# Patient Record
Sex: Male | Born: 1960
Health system: Southern US, Community
[De-identification: ages and names within clinical notes are randomized; demographics above are authoritative.]

## PROBLEM LIST (undated history)

## (undated) ENCOUNTER — Ambulatory Visit (HOSPITAL_COMMUNITY): Discharge status: Absent without leave | Payer: Medicaid Other

## (undated) DIAGNOSIS — R0602 Shortness of breath: Secondary | ICD-10-CM

## (undated) DIAGNOSIS — T7840XA Allergy, unspecified, initial encounter: Secondary | ICD-10-CM

## (undated) DIAGNOSIS — J189 Pneumonia, unspecified organism: Secondary | ICD-10-CM

## (undated) DIAGNOSIS — D72819 Decreased white blood cell count, unspecified: Secondary | ICD-10-CM

## (undated) DIAGNOSIS — K219 Gastro-esophageal reflux disease without esophagitis: Secondary | ICD-10-CM

## (undated) DIAGNOSIS — I10 Essential (primary) hypertension: Secondary | ICD-10-CM

## (undated) HISTORY — PX: OTHER SURGICAL HISTORY: SHX169

## (undated) HISTORY — DX: Essential (primary) hypertension: I10

## (undated) HISTORY — DX: Allergy, unspecified, initial encounter: T78.40XA

---
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
## 2002-01-06 ENCOUNTER — Emergency Department (HOSPITAL_COMMUNITY): Admission: EM | Admit: 2002-01-06 | Discharge: 2002-01-06 | Payer: Self-pay | Admitting: Emergency Medicine

## 2007-01-01 ENCOUNTER — Emergency Department (HOSPITAL_COMMUNITY): Admission: EM | Admit: 2007-01-01 | Discharge: 2007-01-01 | Payer: Self-pay | Admitting: Emergency Medicine

## 2007-09-24 ENCOUNTER — Emergency Department (HOSPITAL_COMMUNITY): Admission: EM | Admit: 2007-09-24 | Discharge: 2007-09-25 | Payer: Self-pay | Admitting: Emergency Medicine

## 2008-10-06 ENCOUNTER — Emergency Department (HOSPITAL_COMMUNITY): Admission: EM | Admit: 2008-10-06 | Discharge: 2008-10-06 | Payer: Self-pay | Admitting: Emergency Medicine

## 2010-02-02 ENCOUNTER — Ambulatory Visit: Payer: Self-pay | Admitting: Internal Medicine

## 2010-03-09 ENCOUNTER — Ambulatory Visit: Payer: Self-pay | Admitting: Internal Medicine

## 2010-03-09 ENCOUNTER — Encounter (INDEPENDENT_AMBULATORY_CARE_PROVIDER_SITE_OTHER): Payer: Self-pay | Admitting: Family Medicine

## 2010-03-09 LAB — CONVERTED CEMR LAB
ALT: 14 units/L (ref 0–53)
AST: 25 units/L (ref 0–37)
Albumin: 4.3 g/dL (ref 3.5–5.2)
Alkaline Phosphatase: 68 units/L (ref 39–117)
BUN: 16 mg/dL (ref 6–23)
Basophils Absolute: 0 10*3/uL (ref 0.0–0.1)
Basophils Relative: 0 % (ref 0–1)
CO2: 26 meq/L (ref 19–32)
Calcium: 9.3 mg/dL (ref 8.4–10.5)
Chloride: 102 meq/L (ref 96–112)
Cholesterol: 199 mg/dL (ref 0–200)
Creatinine, Ser: 0.98 mg/dL (ref 0.40–1.50)
Eosinophils Absolute: 0.3 10*3/uL (ref 0.0–0.7)
Eosinophils Relative: 8 % — ABNORMAL HIGH (ref 0–5)
Glucose, Bld: 95 mg/dL (ref 70–99)
HCT: 43.2 % (ref 39.0–52.0)
HDL: 48 mg/dL (ref >39)
Hemoglobin: 14.6 g/dL (ref 13.0–17.0)
LDL Cholesterol: 136 mg/dL — ABNORMAL HIGH (ref 0–99)
Lymphocytes Relative: 51 % — ABNORMAL HIGH (ref 12–46)
Lymphs Abs: 1.7 10*3/uL (ref 0.7–4.0)
MCHC: 33.8 g/dL (ref 30.0–36.0)
MCV: 81.2 fL (ref 78.0–100.0)
Monocytes Absolute: 0.3 10*3/uL (ref 0.1–1.0)
Monocytes Relative: 9 % (ref 3–12)
Neutro Abs: 1.1 10*3/uL — ABNORMAL LOW (ref 1.7–7.7)
Neutrophils Relative %: 32 % — ABNORMAL LOW (ref 43–77)
Platelets: 249 10*3/uL (ref 150–400)
Potassium: 4.5 meq/L (ref 3.5–5.3)
RBC: 5.32 M/uL (ref 4.22–5.81)
RDW: 14.2 % (ref 11.5–15.5)
Sodium: 137 meq/L (ref 135–145)
Total Bilirubin: 0.5 mg/dL (ref 0.3–1.2)
Total CHOL/HDL Ratio: 4.1
Total Protein: 7.7 g/dL (ref 6.0–8.3)
Triglycerides: 76 mg/dL (ref <150)
VLDL: 15 mg/dL (ref 0–40)
WBC: 3.4 10*3/uL — ABNORMAL LOW (ref 4.0–10.5)

## 2010-03-14 ENCOUNTER — Encounter (INDEPENDENT_AMBULATORY_CARE_PROVIDER_SITE_OTHER): Payer: Self-pay | Admitting: Family Medicine

## 2010-03-23 ENCOUNTER — Ambulatory Visit: Payer: Self-pay | Admitting: Internal Medicine

## 2010-05-24 ENCOUNTER — Encounter (INDEPENDENT_AMBULATORY_CARE_PROVIDER_SITE_OTHER): Payer: Self-pay | Admitting: Family Medicine

## 2010-05-24 ENCOUNTER — Ambulatory Visit: Payer: Self-pay | Admitting: Internal Medicine

## 2010-05-24 LAB — CONVERTED CEMR LAB
Basophils Relative: 0 % (ref 0–1)
Eosinophils Absolute: 0.2 10*3/uL (ref 0.0–0.7)
MCHC: 32.9 g/dL (ref 30.0–36.0)
MCV: 81.8 fL (ref 78.0–100.0)
Monocytes Relative: 11 % (ref 3–12)
Neutrophils Relative %: 28 % — ABNORMAL LOW (ref 43–77)
Platelets: 223 10*3/uL (ref 150–400)
RDW: 13.9 % (ref 11.5–15.5)

## 2010-08-23 ENCOUNTER — Encounter (INDEPENDENT_AMBULATORY_CARE_PROVIDER_SITE_OTHER): Payer: Self-pay | Admitting: Internal Medicine

## 2010-08-23 LAB — CONVERTED CEMR LAB
Basophils Relative: 1 % (ref 0–1)
Chlamydia, Swab/Urine, PCR: POSITIVE — AB
Eosinophils Absolute: 0.2 10*3/uL (ref 0.0–0.7)
GC Probe Amp, Urine: NEGATIVE
Lymphs Abs: 1.7 10*3/uL (ref 0.7–4.0)
MCV: 81.1 fL (ref 78.0–100.0)
Neutrophils Relative %: 37 % — ABNORMAL LOW (ref 43–77)
Platelets: 239 10*3/uL (ref 150–400)
WBC: 3.5 10*3/uL — ABNORMAL LOW (ref 4.0–10.5)

## 2010-08-29 ENCOUNTER — Ambulatory Visit: Payer: Self-pay | Admitting: Internal Medicine

## 2010-09-10 ENCOUNTER — Emergency Department (HOSPITAL_COMMUNITY)
Admission: EM | Admit: 2010-09-10 | Discharge: 2010-09-10 | Payer: Self-pay | Source: Home / Self Care | Admitting: Emergency Medicine

## 2010-10-02 ENCOUNTER — Ambulatory Visit: Payer: Self-pay | Admitting: Internal Medicine

## 2010-10-04 LAB — CBC WITH DIFFERENTIAL/PLATELET
Basophils Absolute: 0 10*3/uL (ref 0.0–0.1)
EOS%: 4.2 % (ref 0.0–7.0)
HCT: 45.9 % (ref 38.4–49.9)
HGB: 15.6 g/dL (ref 13.0–17.1)
MCH: 27.3 pg (ref 27.2–33.4)
MCV: 80.2 fL (ref 79.3–98.0)
MONO%: 7.5 % (ref 0.0–14.0)
NEUT%: 32.3 % — ABNORMAL LOW (ref 39.0–75.0)
Platelets: 251 10*3/uL (ref 140–400)

## 2010-10-05 LAB — COMPREHENSIVE METABOLIC PANEL
CO2: 29 mEq/L (ref 19–32)
Calcium: 9.8 mg/dL (ref 8.4–10.5)
Creatinine, Ser: 1.06 mg/dL (ref 0.40–1.50)
Glucose, Bld: 106 mg/dL — ABNORMAL HIGH (ref 70–99)
Total Bilirubin: 0.4 mg/dL (ref 0.3–1.2)
Total Protein: 7.3 g/dL (ref 6.0–8.3)

## 2010-10-05 LAB — VITAMIN B12: Vitamin B-12: 261 pg/mL (ref 211–911)

## 2010-10-05 LAB — FOLATE RBC: RBC Folate: 599 ng/mL (ref 180–600)

## 2010-10-05 LAB — IRON AND TIBC
TIBC: 310 ug/dL (ref 215–435)
UIBC: 212 ug/dL

## 2010-10-05 LAB — FERRITIN: Ferritin: 171 ng/mL (ref 22–322)

## 2010-10-06 LAB — HEPATITIS B CORE ANTIBODY, IGM: Hep B C IgM: NEGATIVE

## 2010-10-06 LAB — HIV ANTIBODY (ROUTINE TESTING W REFLEX): HIV: NONREACTIVE

## 2010-11-02 ENCOUNTER — Ambulatory Visit: Payer: Self-pay | Admitting: Internal Medicine

## 2010-11-06 LAB — COMPREHENSIVE METABOLIC PANEL
ALT: 16 U/L (ref 0–53)
AST: 25 U/L (ref 0–37)
Albumin: 4.4 g/dL (ref 3.5–5.2)
Alkaline Phosphatase: 69 U/L (ref 39–117)
BUN: 8 mg/dL (ref 6–23)
CO2: 27 mEq/L (ref 19–32)
Calcium: 9.7 mg/dL (ref 8.4–10.5)
Chloride: 101 mEq/L (ref 96–112)
Creatinine, Ser: 1.08 mg/dL (ref 0.40–1.50)
Glucose, Bld: 152 mg/dL — ABNORMAL HIGH (ref 70–99)
Potassium: 3.6 mEq/L (ref 3.5–5.3)
Sodium: 136 mEq/L (ref 135–145)
Total Bilirubin: 0.6 mg/dL (ref 0.3–1.2)
Total Protein: 7.8 g/dL (ref 6.0–8.3)

## 2010-11-06 LAB — CBC WITH DIFFERENTIAL/PLATELET
BASO%: 0.4 % (ref 0.0–2.0)
Basophils Absolute: 0 10*3/uL (ref 0.0–0.1)
EOS%: 6.5 % (ref 0.0–7.0)
Eosinophils Absolute: 0.2 10*3/uL (ref 0.0–0.5)
HCT: 44.6 % (ref 38.4–49.9)
HGB: 15 g/dL (ref 13.0–17.1)
LYMPH%: 45.2 % (ref 14.0–49.0)
MCH: 27.6 pg (ref 27.2–33.4)
MCHC: 33.8 g/dL (ref 32.0–36.0)
MCV: 81.8 fL (ref 79.3–98.0)
MONO#: 0.3 10*3/uL (ref 0.1–0.9)
MONO%: 7.7 % (ref 0.0–14.0)
NEUT#: 1.4 10*3/uL — ABNORMAL LOW (ref 1.5–6.5)
NEUT%: 40.2 % (ref 39.0–75.0)
Platelets: 221 10*3/uL (ref 140–400)
RBC: 5.45 10*6/uL (ref 4.20–5.82)
RDW: 13.8 % (ref 11.0–14.6)
WBC: 3.4 10*3/uL — ABNORMAL LOW (ref 4.0–10.3)
lymph#: 1.5 10*3/uL (ref 0.9–3.3)

## 2010-11-06 LAB — LACTATE DEHYDROGENASE: LDH: 126 U/L (ref 94–250)

## 2010-11-09 ENCOUNTER — Encounter (INDEPENDENT_AMBULATORY_CARE_PROVIDER_SITE_OTHER): Payer: Self-pay | Admitting: Internal Medicine

## 2010-11-09 LAB — CONVERTED CEMR LAB
ALT: 14 units/L (ref 0–53)
AST: 23 units/L (ref 0–37)
Chloride: 107 meq/L (ref 96–112)
Creatinine, Ser: 1.06 mg/dL (ref 0.40–1.50)
Total Bilirubin: 0.3 mg/dL (ref 0.3–1.2)

## 2010-12-11 ENCOUNTER — Encounter (INDEPENDENT_AMBULATORY_CARE_PROVIDER_SITE_OTHER): Payer: Self-pay | Admitting: *Deleted

## 2011-01-02 LAB — OVA AND PARASITE EXAMINATION

## 2011-01-02 LAB — STOOL CULTURE

## 2011-01-02 LAB — CLOSTRIDIUM DIFFICILE EIA

## 2011-02-05 ENCOUNTER — Other Ambulatory Visit: Payer: Self-pay | Admitting: Internal Medicine

## 2011-02-05 ENCOUNTER — Encounter (HOSPITAL_BASED_OUTPATIENT_CLINIC_OR_DEPARTMENT_OTHER): Payer: Self-pay | Admitting: Internal Medicine

## 2011-02-05 DIAGNOSIS — D708 Other neutropenia: Secondary | ICD-10-CM

## 2011-02-05 DIAGNOSIS — D72819 Decreased white blood cell count, unspecified: Secondary | ICD-10-CM

## 2011-02-05 LAB — CBC WITH DIFFERENTIAL/PLATELET
Basophils Absolute: 0 10*3/uL (ref 0.0–0.1)
EOS%: 10.5 % — ABNORMAL HIGH (ref 0.0–7.0)
HCT: 42.5 % (ref 38.4–49.9)
HGB: 14.4 g/dL (ref 13.0–17.1)
LYMPH%: 45.8 % (ref 14.0–49.0)
MCH: 27.7 pg (ref 27.2–33.4)
MCHC: 34 g/dL (ref 32.0–36.0)
MCV: 81.6 fL (ref 79.3–98.0)
MONO%: 11.2 % (ref 0.0–14.0)
NEUT%: 32.1 % — ABNORMAL LOW (ref 39.0–75.0)
Platelets: 200 10*3/uL (ref 140–400)
lymph#: 1.8 10*3/uL (ref 0.9–3.3)

## 2011-08-07 ENCOUNTER — Other Ambulatory Visit: Payer: Self-pay | Admitting: Internal Medicine

## 2011-08-07 ENCOUNTER — Encounter (HOSPITAL_BASED_OUTPATIENT_CLINIC_OR_DEPARTMENT_OTHER): Payer: Self-pay | Admitting: Internal Medicine

## 2011-08-07 DIAGNOSIS — D708 Other neutropenia: Secondary | ICD-10-CM

## 2011-08-07 DIAGNOSIS — D72819 Decreased white blood cell count, unspecified: Secondary | ICD-10-CM

## 2011-08-07 LAB — CBC WITH DIFFERENTIAL/PLATELET
EOS%: 7.2 % — ABNORMAL HIGH (ref 0.0–7.0)
Eosinophils Absolute: 0.2 10*3/uL (ref 0.0–0.5)
LYMPH%: 52 % — ABNORMAL HIGH (ref 14.0–49.0)
MCH: 27.9 pg (ref 27.2–33.4)
MCV: 82.1 fL (ref 79.3–98.0)
MONO%: 12.1 % (ref 0.0–14.0)
NEUT#: 0.9 10*3/uL — ABNORMAL LOW (ref 1.5–6.5)
Platelets: 198 10*3/uL (ref 140–400)
RBC: 5.22 10*6/uL (ref 4.20–5.82)

## 2011-08-07 LAB — LACTATE DEHYDROGENASE: LDH: 139 U/L (ref 94–250)

## 2011-08-24 ENCOUNTER — Ambulatory Visit (HOSPITAL_COMMUNITY): Payer: Self-pay

## 2011-08-29 ENCOUNTER — Other Ambulatory Visit: Payer: Self-pay | Admitting: Internal Medicine

## 2011-08-29 ENCOUNTER — Telehealth: Payer: Self-pay | Admitting: Internal Medicine

## 2011-08-29 NOTE — Telephone Encounter (Signed)
Left message to call.

## 2011-09-07 ENCOUNTER — Telehealth: Payer: Self-pay | Admitting: Internal Medicine

## 2011-09-07 ENCOUNTER — Telehealth: Payer: Self-pay | Admitting: *Deleted

## 2011-09-07 ENCOUNTER — Other Ambulatory Visit: Payer: Self-pay | Admitting: *Deleted

## 2011-09-07 DIAGNOSIS — D72819 Decreased white blood cell count, unspecified: Secondary | ICD-10-CM

## 2011-09-07 NOTE — Telephone Encounter (Signed)
PT. TOLD THE EMERGENCY ROOM RECEPTIONIST THAT HE HAD AN APPOINTMENT THERE WITH DR.MOHAMED AT NOON. PT. WAS UPSET BECAUSE SOMEONE AT THE CANCER CENTER TOLD PT. HE DID NOT HAVE AN APPOINTMENT WITH DR.MOHAMED TODAY. INSTRUCTED RECEPTIONIST TO HAVE PT. RETURN TO THE LOBBY AT THE CANCER CENTER AND THIS NURSE WOULD TALK WITH HIM. SPOKE WITH DR.MOHAMED, STEPHANIE JOHNSON,RN. SHE WILL ALSO COME AND TALK WITH THE PT. WHEN HE ARRIVES AT THE CANCER CENTER. PT. NEVER CAME TO THE CANCER CENTER.

## 2011-09-07 NOTE — Telephone Encounter (Signed)
Talked to pt and gave appt for 11/30 and appt for 12/3, r/s from 11/28th

## 2011-09-09 NOTE — Telephone Encounter (Signed)
No encounter

## 2011-09-17 ENCOUNTER — Encounter (HOSPITAL_COMMUNITY): Payer: Self-pay | Admitting: Pharmacy Technician

## 2011-09-19 ENCOUNTER — Other Ambulatory Visit: Payer: Self-pay | Admitting: Lab

## 2011-09-19 ENCOUNTER — Ambulatory Visit: Payer: Self-pay | Admitting: Internal Medicine

## 2011-09-19 ENCOUNTER — Other Ambulatory Visit (HOSPITAL_COMMUNITY): Payer: Self-pay | Admitting: *Deleted

## 2011-09-20 ENCOUNTER — Other Ambulatory Visit (HOSPITAL_COMMUNITY): Payer: Self-pay | Admitting: *Deleted

## 2011-09-20 ENCOUNTER — Other Ambulatory Visit: Payer: Self-pay | Admitting: Internal Medicine

## 2011-09-20 DIAGNOSIS — D72819 Decreased white blood cell count, unspecified: Secondary | ICD-10-CM

## 2011-09-20 NOTE — Progress Notes (Signed)
Error encounter. 

## 2011-09-21 ENCOUNTER — Ambulatory Visit (HOSPITAL_COMMUNITY)
Admission: RE | Admit: 2011-09-21 | Discharge: 2011-09-21 | Disposition: A | Discharge status: Home / Self Care | Payer: Self-pay | Source: Ambulatory Visit | Attending: Internal Medicine | Admitting: Internal Medicine

## 2011-09-21 ENCOUNTER — Encounter: Payer: Self-pay | Admitting: Internal Medicine

## 2011-09-21 ENCOUNTER — Other Ambulatory Visit: Payer: Self-pay | Admitting: Internal Medicine

## 2011-09-21 ENCOUNTER — Encounter (HOSPITAL_COMMUNITY): Payer: Self-pay

## 2011-09-21 ENCOUNTER — Telehealth: Payer: Self-pay | Admitting: Internal Medicine

## 2011-09-21 DIAGNOSIS — D72819 Decreased white blood cell count, unspecified: Secondary | ICD-10-CM | POA: Insufficient documentation

## 2011-09-21 HISTORY — DX: Decreased white blood cell count, unspecified: D72.819

## 2011-09-21 HISTORY — DX: Gastro-esophageal reflux disease without esophagitis: K21.9

## 2011-09-21 HISTORY — PX: BIOPSY BONE MARROW: PRO6

## 2011-09-21 LAB — CBC
HCT: 44.3 % (ref 39.0–52.0)
MCHC: 33.4 g/dL (ref 30.0–36.0)
MCV: 81.3 fL (ref 78.0–100.0)
RDW: 13.4 % (ref 11.5–15.5)

## 2011-09-21 LAB — DIFFERENTIAL
Basophils Absolute: 0 10*3/uL (ref 0.0–0.1)
Basophils Relative: 1 % (ref 0–1)
Eosinophils Relative: 5 % (ref 0–5)
Monocytes Absolute: 0.3 10*3/uL (ref 0.1–1.0)
Neutro Abs: 1.2 10*3/uL — ABNORMAL LOW (ref 1.7–7.7)

## 2011-09-21 MED ORDER — SODIUM CHLORIDE 0.9 % IV SOLN
INTRAVENOUS | Status: DC
  Administered 2011-09-21: 07:00:00 via INTRAVENOUS

## 2011-09-21 MED ORDER — MIDAZOLAM HCL 10 MG/2ML IJ SOLN
INTRAMUSCULAR | Status: AC
  Filled 2011-09-21: qty 2

## 2011-09-21 MED ORDER — MIDAZOLAM HCL 5 MG/5ML IJ SOLN
INTRAMUSCULAR | Status: AC | PRN
  Administered 2011-09-21 (×2): 2 mg via INTRAVENOUS

## 2011-09-21 NOTE — Procedures (Signed)
Bone Marrow Biopsy and Aspiration Procedure Note  Mallampati's class : 1 ASA class: 1 Informed consent was obtained and potential risks including bleeding, infection and pain were reviewed with the patient.   Versed 4 mg given IV for sedation.  Posterior iliac crest(s) prepped with Betadine.   Lidocaine 2% local anesthesia infiltrated into the subcutaneous tissue.  Left bone marrow biopsy and left bone marrow aspirate was obtained.   The procedure was tolerated well and there were no complications.  Specimens sent for: routine histopathologic stains and sectioning, flow cytometry and cytogenetics  Physician: Lajuana Matte.

## 2011-09-21 NOTE — Telephone Encounter (Signed)
Called pt and telephone numbers are both no good

## 2011-09-21 NOTE — ED Notes (Signed)
MD at bedside. 

## 2011-09-21 NOTE — Sedation Documentation (Signed)
Medication dose calculated and verified for:4mg  Versed

## 2011-09-21 NOTE — ED Notes (Signed)
Patient is resting comfortably. 

## 2011-09-21 NOTE — ED Notes (Signed)
Patient denies pain and is resting comfortably.  

## 2011-09-21 NOTE — ED Notes (Signed)
Family updated as to patient's status.

## 2011-09-24 ENCOUNTER — Other Ambulatory Visit: Payer: Self-pay | Admitting: Lab

## 2011-09-24 ENCOUNTER — Ambulatory Visit: Payer: Self-pay | Admitting: Internal Medicine

## 2011-09-27 ENCOUNTER — Other Ambulatory Visit (HOSPITAL_BASED_OUTPATIENT_CLINIC_OR_DEPARTMENT_OTHER): Payer: Self-pay | Admitting: Lab

## 2011-09-27 ENCOUNTER — Ambulatory Visit (HOSPITAL_BASED_OUTPATIENT_CLINIC_OR_DEPARTMENT_OTHER): Payer: Self-pay | Admitting: Internal Medicine

## 2011-09-27 VITALS — BP 135/93 | HR 77 | Temp 98.1°F | Ht 63.0 in | Wt 166.9 lb

## 2011-09-27 DIAGNOSIS — K3189 Other diseases of stomach and duodenum: Secondary | ICD-10-CM

## 2011-09-27 DIAGNOSIS — D72819 Decreased white blood cell count, unspecified: Secondary | ICD-10-CM

## 2011-09-27 LAB — COMPREHENSIVE METABOLIC PANEL
ALT: 18 U/L (ref 0–53)
AST: 26 U/L (ref 0–37)
Albumin: 4.4 g/dL (ref 3.5–5.2)
Calcium: 9.2 mg/dL (ref 8.4–10.5)
Chloride: 101 mEq/L (ref 96–112)
Creatinine, Ser: 0.99 mg/dL (ref 0.50–1.35)
Potassium: 3.8 mEq/L (ref 3.5–5.3)

## 2011-09-27 LAB — CBC WITH DIFFERENTIAL/PLATELET
BASO%: 0.4 % (ref 0.0–2.0)
EOS%: 5.6 % (ref 0.0–7.0)
MCH: 27.6 pg (ref 27.2–33.4)
MCHC: 33.5 g/dL (ref 32.0–36.0)
RDW: 14.3 % (ref 11.0–14.6)
lymph#: 1.9 10*3/uL (ref 0.9–3.3)

## 2011-09-28 DIAGNOSIS — D72819 Decreased white blood cell count, unspecified: Secondary | ICD-10-CM | POA: Insufficient documentation

## 2011-09-28 NOTE — Progress Notes (Signed)
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   CURRENT THERAPY: Observation.  INTERVAL HISTORY: Manuel Brooks 50 y.o. male returns to the clinic today for followup visit. The patient is doing fine he denied having any significant complaints today. He denied having any significant fever or chills, no nausea or vomiting, no chest pain or shortness breath. He continues to have mild indigestion and he is currently on omeprazole. The patient underwent a bone marrow biopsy and aspirate on 09/21/2011 evaluation of his leukocytopenia. He is here today for evaluation and discussion of his biopsy results.  MEDICAL HISTORY: Past Medical History  Diagnosis Date  . Leucopenia   . GERD (gastroesophageal reflux disease)     ALLERGIES:   has no known allergies.  MEDICATIONS:  Current Outpatient Prescriptions  Medication Sig Dispense Refill  . pantoprazole (PROTONIX) 40 MG tablet Take 40 mg by mouth daily.        . Pseudoeph-Doxylamine-DM-APAP (NYQUIL D COLD/FLU) 60-12.03-20-999 MG/30ML LIQD Take 30 mLs by mouth Nightly.          SURGICAL HISTORY:  Past Surgical History  Procedure Date  . Other surgical history     was unable to have childern and had surgery so he could have children 1990  . Biopsy bone marrow 09/21/2011         REVIEW OF SYSTEMS:  A comprehensive review of systems was negative.   PHYSICAL EXAMINATION: General appearance: alert, cooperative and no distress Head: Normocephalic, without obvious abnormality, atraumatic Neck: no adenopathy Lymph nodes: Cervical, supraclavicular, and axillary nodes normal. Resp: clear to auscultation bilaterally Cardio: regular rate and rhythm, S1, S2 normal, no murmur, click, rub or gallop GI: soft, non-tender; bowel sounds normal; no masses,  no organomegaly Extremities: extremities normal, atraumatic, no cyanosis or edema Neurologic: Alert and oriented X 3, normal strength and tone.  Normal symmetric reflexes. Normal coordination and gait  ECOG PERFORMANCE STATUS: 0 - Asymptomatic  Blood pressure 135/93, pulse 77, temperature 98.1 F (36.7 C), temperature source Oral, height 5\' 3"  (1.6 m), weight 166 lb 14.4 oz (75.705 kg).  LABORATORY DATA: Lab Results  Component Value Date   WBC 3.7* 09/27/2011   HGB 14.9 09/27/2011   HCT 44.6 09/27/2011   MCV 82.4 09/27/2011   PLT 211 09/27/2011      Chemistry      Component Value Date/Time   NA 139 09/27/2011 1015   K 3.8 09/27/2011 1015   CL 101 09/27/2011 1015   CO2 28 09/27/2011 1015   BUN 18 09/27/2011 1015   CREATININE 0.99 09/27/2011 1015      Component Value Date/Time   CALCIUM 9.2 09/27/2011 1015   ALKPHOS 62 09/27/2011 1015   AST 26 09/27/2011 1015   ALT 18 09/27/2011 1015   BILITOT 0.4 09/27/2011 1015      BONE MARROW REPORT FINAL DIAGNOSIS Diagnosis Bone Marrow, Aspirate,Biopsy, and Clot, left superior posterior iliac crest TRILINEAGE HEMATOPOIESIS. NO INCREASED BLASTS. PERIPHERAL BLOOD: SLIGHT NEUTROPENIA. Diagnosis Note The marrow shows trilineage hematopoiesis and no increase in blasts are identified. 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 The bone marrow biopsy and aspirate showed no significant abnormalities. I discussed the results with the patient.  PLAN: I recommend for him observation with his primary care physician. I don't see a need for metastatic to continue working  followup visit with me at the cancer Center at this point but will be happy to see in the future if needed. The patient agreed to the current plan.   All questions were answered. The patient knows to call the clinic with any problems, questions or concerns. We can certainly see the patient much sooner if necessary.

## 2012-03-27 ENCOUNTER — Encounter (HOSPITAL_COMMUNITY): Payer: Self-pay | Admitting: *Deleted

## 2012-11-10 ENCOUNTER — Emergency Department (INDEPENDENT_AMBULATORY_CARE_PROVIDER_SITE_OTHER)
Admission: EM | Admit: 2012-11-10 | Discharge: 2012-11-10 | Disposition: A | Discharge status: Home / Self Care | Payer: No Typology Code available for payment source | Source: Home / Self Care

## 2012-11-10 ENCOUNTER — Other Ambulatory Visit (HOSPITAL_COMMUNITY)
Admission: RE | Admit: 2012-11-10 | Discharge: 2012-11-10 | Disposition: A | Discharge status: Home / Self Care | Payer: No Typology Code available for payment source | Source: Ambulatory Visit | Attending: Family Medicine | Admitting: Family Medicine

## 2012-11-10 ENCOUNTER — Encounter (HOSPITAL_COMMUNITY): Payer: Self-pay

## 2012-11-10 DIAGNOSIS — N342 Other urethritis: Secondary | ICD-10-CM

## 2012-11-10 DIAGNOSIS — R300 Dysuria: Secondary | ICD-10-CM

## 2012-11-10 DIAGNOSIS — Z113 Encounter for screening for infections with a predominantly sexual mode of transmission: Secondary | ICD-10-CM | POA: Insufficient documentation

## 2012-11-10 DIAGNOSIS — R369 Urethral discharge, unspecified: Secondary | ICD-10-CM

## 2012-11-10 LAB — POCT URINALYSIS DIP (DEVICE)
Protein, ur: NEGATIVE mg/dL
Specific Gravity, Urine: 1.015 (ref 1.005–1.030)
Urobilinogen, UA: 0.2 mg/dL (ref 0.0–1.0)

## 2012-11-10 MED ORDER — AZITHROMYCIN 250 MG PO TABS: ORAL_TABLET | ORAL | Status: DC

## 2012-11-10 MED ORDER — CEFTRIAXONE SODIUM 250 MG IJ SOLR
250.0000 mg | Freq: Once | INTRAMUSCULAR | Status: AC
  Administered 2012-11-10: 250 mg via INTRAMUSCULAR

## 2012-11-10 MED ORDER — CEFTRIAXONE SODIUM 250 MG IJ SOLR
INTRAMUSCULAR | Status: AC
  Filled 2012-11-10: qty 250

## 2012-11-10 NOTE — ED Provider Notes (Signed)
History   CSN: [id number]  Arrival date & time 11/10/12  1028  Chief Complaint  Patient presents with  . Urinary Tract Infection   The history is provided by the patient. The history is limited by a language barrier.   Pt reports 2 weeks of burning with urination.  Painful burning after intercourse.  No fever or chills.  He reports that he has had a penile discharge.  He reports that he had a similar infection 2 years ago, treated successfully with azithromycin.  Pt reports that he is married and with one sexual partner only.    Past Medical History  Diagnosis Date  . Leucopenia   . GERD (gastroesophageal reflux disease)     Past Surgical History  Procedure Date  . Other surgical history     was unable to have childern and had surgery so he could have children 1990  . Biopsy bone marrow 09/21/2011         No family history on file.  History  Substance Use Topics  . Smoking status: Not on file  . Smokeless tobacco: Not on file  . Alcohol Use: Not on file    Review of Systems  Genitourinary: Positive for dysuria. Negative for urgency and testicular pain.  Neurological: Negative.   Hematological: Negative.   Psychiatric/Behavioral: Negative.   All other systems reviewed and are negative.    Allergies  Review of patient's allergies indicates no known allergies.  Home Medications   Current Outpatient Rx  Name  Route  Sig  Dispense  Refill  . PANTOPRAZOLE SODIUM 40 MG PO TBEC   Oral   Take 40 mg by mouth daily.           Marland Kitchen PSEUDOEPH-DOXYLAMINE-DM-APAP 60-12.03-20-999 MG/30ML PO LIQD   Oral   Take 30 mLs by mouth Nightly.            BP 137/92  Pulse 87  Temp 98.1 F (36.7 C) (Oral)  Resp 18  SpO2 100%  Physical Exam  Nursing note and vitals reviewed. Constitutional: He is oriented to person, place, and time. He appears well-developed and well-nourished. No distress.  HENT:  Head: Normocephalic and atraumatic.  Eyes: EOM are normal. Pupils are equal,  round, and reactive to light.  Neck: Normal range of motion. Neck supple.  Cardiovascular: Normal rate and regular rhythm.   Pulmonary/Chest: Effort normal and breath sounds normal.  Abdominal: Soft. Bowel sounds are normal.  Genitourinary: Penis normal. No penile tenderness.  Musculoskeletal: Normal range of motion.  Neurological: He is alert and oriented to person, place, and time.  Skin: Skin is warm and dry.  Psychiatric: He has a normal mood and affect. His behavior is normal. Judgment and thought content normal.    ED Course  Procedures (including critical care time)  Labs Reviewed - No data to display No results found.  No diagnosis found.   MDM  IMPRESSION  Nongonococcal Urethritis - working diagnosis  RECOMMENDATIONS / PLAN Empirically treated patient with ceftriaxone 250 mg IM followed by a prescription for azithromycin 250 mg take 4 tablets by mouth times one dose.  I ordered ancillary urine studies to include cytology for GC and Chlamydia will followup on those results.  FOLLOW UP 1 month  The patient was given clear instructions to go to ER or return to medical center if symptoms don't improve, worsen or new problems develop.  The patient verbalized understanding.  The patient was told to call to get lab results if they  haven't heard anything in the next week.            Cleora Fleet, MD 11/10/12 [phone]

## 2012-11-10 NOTE — ED Notes (Signed)
Complain of burning after urination, has been going on for months

## 2012-11-11 NOTE — Progress Notes (Signed)
Quick Note:  Please call the patient notify him that his Chlamydia test came back positive. Please tell him that the treatment for that chlamydia was the azithromycin 250 mg tablets where he would take 4 tablets at one time. The prescription was given to him during his office visit. Please make sure that he got that prescription filled and that he took that prescription. If he had any trouble getting the prescription please let us know and we can try and change the prescription. Please tell the patient that he needs notify sexual partners and make sure that they are also treated for this sexually transmitted disease.   Rodney Langton, MD, CDE, FAAFP [facility] [place], [place]   ______

## 2012-11-26 ENCOUNTER — Telehealth (HOSPITAL_COMMUNITY): Payer: Self-pay

## 2012-12-24 ENCOUNTER — Encounter (HOSPITAL_COMMUNITY): Payer: Self-pay

## 2012-12-24 ENCOUNTER — Emergency Department (INDEPENDENT_AMBULATORY_CARE_PROVIDER_SITE_OTHER)
Admission: EM | Admit: 2012-12-24 | Discharge: 2012-12-24 | Disposition: A | Discharge status: Home / Self Care | Payer: No Typology Code available for payment source | Source: Home / Self Care

## 2012-12-24 ENCOUNTER — Other Ambulatory Visit (HOSPITAL_COMMUNITY)
Admission: RE | Admit: 2012-12-24 | Discharge: 2012-12-24 | Disposition: A | Discharge status: Home / Self Care | Payer: No Typology Code available for payment source | Source: Ambulatory Visit | Attending: Internal Medicine | Admitting: Internal Medicine

## 2012-12-24 DIAGNOSIS — R300 Dysuria: Secondary | ICD-10-CM

## 2012-12-24 DIAGNOSIS — Z113 Encounter for screening for infections with a predominantly sexual mode of transmission: Secondary | ICD-10-CM | POA: Insufficient documentation

## 2012-12-24 LAB — POCT URINALYSIS DIP (DEVICE)
Bilirubin Urine: NEGATIVE
Glucose, UA: NEGATIVE mg/dL
Ketones, ur: NEGATIVE mg/dL
Leukocytes, UA: NEGATIVE

## 2012-12-24 NOTE — ED Notes (Signed)
Patient Demographics  Manuel Brooks, is a 52 y.o. male  YNW:[id number]  MVH:[id number]  DOB - November 20, 1960  No chief complaint on file.       Subjective:   Manuel Brooks today has, No headache, No chest pain, No abdominal pain - No Nausea, No new weakness tingling or numbness, No Cough - SOB. Some dysuria and penile shaft pain with urination. No discharge.  Objective:    Filed Vitals:   12/24/12 1711  BP: 124/94  Pulse: 92  Temp: 97.7 F (36.5 C)  TempSrc: Oral  Resp: 18  SpO2: 100%     Exam  Awake Alert, Oriented X 3, No new F.N deficits, Normal affect [place].AT,PERRAL Supple Neck,No JVD, No cervical lymphadenopathy appriciated.  Symmetrical Chest wall movement, Good air movement bilaterally, CTAB RRR,No Gallops,Rubs or new Murmurs, No Parasternal Heave +ve B.Sounds, Abd Soft, Non tender, No organomegaly appriciated, No rebound - guarding or rigidity. Prostate was nontender no nodules were palpated. No Cyanosis, Clubbing or edema, No new Rash or bruise      Data Review   CBC No results found for this basename: WBC, HGB, HCT, PLT, MCV, MCH, MCHC, RDW, NEUTRABS, LYMPHSABS, MONOABS, EOSABS, BASOSABS, BANDABS, BANDSABD,  in the last 168 hours  Chemistries   No results found for this basename: NA, K, CL, CO2, GLUCOSE, BUN, CREATININE, GFRCGP, CALCIUM, MG, AST, ALT, ALKPHOS, BILITOT,  in the last 168 hours ------------------------------------------------------------------------------------------------------------------ No results found for this basename: HGBA1C,  in the last 72 hours ------------------------------------------------------------------------------------------------------------------ No results found for this basename: CHOL, HDL, LDLCALC, TRIG, CHOLHDL, LDLDIRECT,  in the last 72 hours ------------------------------------------------------------------------------------------------------------------ No results found for this basename: TSH, T4TOTAL, FREET3, T3FREE,  THYROIDAB,  in the last 72 hours ------------------------------------------------------------------------------------------------------------------ No results found for this basename: VITAMINB12, FOLATE, FERRITIN, TIBC, IRON, RETICCTPCT,  in the last 72 hours  Coagulation profile  No results found for this basename: INR, PROTIME,  in the last 168 hours     Prior to Admission medications   Medication Sig Start Date End Date Taking? Authorizing Provider  azithromycin (ZITHROMAX) 250 MG tablet Take 4 tabs po together once 11/10/12   Clanford L Laural Benes, MD  pantoprazole (PROTONIX) 40 MG tablet Take 40 mg by mouth daily.      Historical Provider, MD     Assessment & Plan   Patient was recently diagnosed with chlamydia infection he was treated with azithromycin, he felt better, his wife was also treated with same antibiotic. He feels better, no fever chills, he still has some intermittent dysuria and penile shaft discomfort associated with urination.  Have ordered a UA with urine culture, have repeated urine cytology for chlamydia, cardiac, vaginosis, Candida. Have requested patient to come back in 2-3 days for culture results and test results.  Symptoms persist outpatient urology followup  Follow-up Information   Follow up with Primary care provider. Schedule an appointment as soon as possible for a visit in 4 days. (Come back for test results)        Leroy Sea M.D on 12/24/2012 at 5:27 PM   Leroy Sea, MD 12/24/12 [phone]

## 2012-12-24 NOTE — ED Notes (Signed)
Patient states when he is done urinating  He is getting residual urine drops afterwards  Denies any burning

## 2012-12-26 ENCOUNTER — Emergency Department (INDEPENDENT_AMBULATORY_CARE_PROVIDER_SITE_OTHER)
Admission: EM | Admit: 2012-12-26 | Discharge: 2012-12-26 | Disposition: A | Discharge status: Home / Self Care | Payer: No Typology Code available for payment source | Source: Home / Self Care

## 2012-12-26 ENCOUNTER — Encounter (HOSPITAL_COMMUNITY): Payer: Self-pay

## 2012-12-26 DIAGNOSIS — R300 Dysuria: Secondary | ICD-10-CM

## 2012-12-26 LAB — URINE CULTURE: Culture: NO GROWTH

## 2012-12-26 NOTE — ED Notes (Signed)
Patient Demographics  Manuel Brooks, is a 52 y.o. male  EAV:[id number]  NWG:[id number]  DOB - 11-24-1960  No chief complaint on file.       Subjective:   Manuel Brooks today has, No headache, No chest pain, No abdominal pain - No Nausea, No new weakness tingling or numbness, No Cough - SOB. Continues to complain of pain with urination. Denies any discharges. Denies any fever or chills. Denies any back pain. Has a history of recently treated Chlamydia, at that time he had discharges. Today's symptoms are different.  Objective:    There were no vitals filed for this visit.   Exam  Awake Alert, Oriented X 3, No new F.N deficits, Normal affect [place].AT,PERRAL Supple Neck,No JVD, No cervical lymphadenopathy appriciated.  Symmetrical Chest wall movement, Good air movement bilaterally, CTAB RRR,No Gallops,Rubs or new Murmurs, No Parasternal Heave +ve B.Sounds, Abd Soft, Non tender, No organomegaly appriciated, No rebound - guarding or rigidity. No Cyanosis, Clubbing or edema, No new Rash or bruise   Penis normal. No rashes. No discharges.    Data Review   CBC No results found for this basename: WBC, HGB, HCT, PLT, MCV, MCH, MCHC, RDW, NEUTRABS, LYMPHSABS, MONOABS, EOSABS, BASOSABS, BANDABS, BANDSABD,  in the last 168 hours  Chemistries   No results found for this basename: NA, K, CL, CO2, GLUCOSE, BUN, CREATININE, GFRCGP, CALCIUM, MG, AST, ALT, ALKPHOS, BILITOT,  in the last 168 hours ------------------------------------------------------------------------------------------------------------------ No results found for this basename: HGBA1C,  in the last 72 hours ------------------------------------------------------------------------------------------------------------------ No results found for this basename: CHOL, HDL, LDLCALC, TRIG, CHOLHDL, LDLDIRECT,  in the last 72 hours ------------------------------------------------------------------------------------------------------------------ No  results found for this basename: TSH, T4TOTAL, FREET3, T3FREE, THYROIDAB,  in the last 72 hours ------------------------------------------------------------------------------------------------------------------ No results found for this basename: VITAMINB12, FOLATE, FERRITIN, TIBC, IRON, RETICCTPCT,  in the last 72 hours  Coagulation profile  No results found for this basename: INR, PROTIME,  in the last 168 hours     Prior to Admission medications   Medication Sig Start Date End Date Taking? Authorizing Provider  azithromycin (ZITHROMAX) 250 MG tablet Take 4 tabs po together once 11/10/12   Clanford L Laural Benes, MD  pantoprazole (PROTONIX) 40 MG tablet Take 40 mg by mouth daily.      Historical Provider, MD     Assessment & Plan   Dysuria - he does not have a urinary tract infection based on urinalysis done here last time and the urine culture. Going to refer him to urology.    Follow-up Information   Follow up with NESI,MARC-HENRY, MD. Schedule an appointment as soon as possible for a visit in 1 week.   Contact information:   [address] [place] [address] [phone]        Pamella Pert M.D on 12/26/2012 at 4:47 PM    Pamella Pert, MD 12/26/12 1650

## 2012-12-26 NOTE — ED Notes (Signed)
Patient is here for follow up and to get lab results

## 2013-04-22 ENCOUNTER — Ambulatory Visit: Payer: No Typology Code available for payment source | Attending: Family Medicine | Admitting: Internal Medicine

## 2013-04-22 ENCOUNTER — Encounter: Payer: Self-pay | Admitting: Internal Medicine

## 2013-04-22 VITALS — BP 125/82 | HR 65 | Temp 98.1°F | Resp 16 | Ht 65.0 in | Wt 165.2 lb

## 2013-04-22 DIAGNOSIS — K047 Periapical abscess without sinus: Secondary | ICD-10-CM | POA: Insufficient documentation

## 2013-04-22 MED ORDER — HYDROCORTISONE 1 % EX OINT: TOPICAL_OINTMENT | Freq: Two times a day (BID) | CUTANEOUS | Status: DC

## 2013-04-22 MED ORDER — DOXYCYCLINE HYCLATE 100 MG PO TABS: 100.0000 mg | ORAL_TABLET | Freq: Two times a day (BID) | ORAL | Status: DC

## 2013-04-22 NOTE — Patient Instructions (Addendum)
°  Dental Abscess °A dental abscess is a collection of infected fluid (pus) from a bacterial infection in the inner part of the tooth (pulp). It usually occurs at the end of the tooth's root.  °CAUSES  °· Severe tooth decay. °· Trauma to the tooth that allows bacteria to enter into the pulp, such as a broken or chipped tooth. °SYMPTOMS  °· Severe pain in and around the infected tooth. °· Swelling and redness around the abscessed tooth or in the mouth or face. °· Tenderness. °· Pus drainage. °· Bad breath. °· Bitter taste in the mouth. °· Difficulty swallowing. °· Difficulty opening the mouth. °· Nausea. °· Vomiting. °· Chills. °· Swollen neck glands. °DIAGNOSIS  °· A medical and dental history will be taken. °· An examination will be performed by tapping on the abscessed tooth. °· X-rays may be taken of the tooth to identify the abscess. °TREATMENT °The goal of treatment is to eliminate the infection. You may be prescribed antibiotic medicine to stop the infection from spreading. A root canal may be performed to save the tooth. If the tooth cannot be saved, it may be pulled (extracted) and the abscess may be drained.  °HOME CARE INSTRUCTIONS °· Only take over-the-counter or prescription medicines for pain, fever, or discomfort as directed by your caregiver. °· Rinse your mouth (gargle) often with salt water (¼ tsp salt in 8 oz [250 ml] of warm water) to relieve pain or swelling. °· Do not drive after taking pain medicine (narcotics). °· Do not apply heat to the outside of your face. °· Return to your dentist for further treatment as directed. °SEEK MEDICAL CARE IF: °· Your pain is not helped by medicine. °· Your pain is getting worse instead of better. °SEEK IMMEDIATE MEDICAL CARE IF: °· You have a fever or persistent symptoms for more than 2 3 days. °· You have a fever and your symptoms suddenly get worse. °· You have chills or a very bad headache. °· You have problems breathing or swallowing. °· You have trouble  opening your mouth. °· You have swelling in the neck or around the eye. °Document Released: 10/08/2005 Document Revised: 07/02/2012 Document Reviewed: 01/16/2011 °Exit[employer]® Patient Information ©2014 [employer]. ° ° °

## 2013-04-22 NOTE — Progress Notes (Signed)
Patient ID: Manuel Brooks, male   DOB: June 06, 1961, 52 y.o.   MRN: [id number]  CC: Dental pain  HPI: Patient is 52 year old male who comes to clinic with concern of small abscess noted on the left lower 6th molar tooth. He notices several days prior to this visit and has been getting worse, he feels it is draining occasionally clear pus. He denies fevers and chills but reports difficulty chewing on that side. He denies any other systemic concerns.  No Known Allergies Past Medical History  Diagnosis Date  . Leucopenia   . GERD (gastroesophageal reflux disease)    Current Outpatient Prescriptions on File Prior to Visit  Medication Sig Dispense Refill  . azithromycin (ZITHROMAX) 250 MG tablet Take 4 tabs po together once  4 tablet  0  . pantoprazole (PROTONIX) 40 MG tablet Take 40 mg by mouth daily.         No current facility-administered medications on file prior to visit.   History reviewed. No pertinent family history. History   Social History  . Marital Status: Married    Spouse Name: N/A    Number of Children: N/A  . Years of Education: N/A   Occupational History  . Not on file.   Social History Main Topics  . Smoking status: Not on file  . Smokeless tobacco: Not on file  . Alcohol Use: Not on file  . Drug Use: Not on file  . Sexually Active: Not on file   Other Topics Concern  . Not on file   Social History Narrative  . No narrative on file    Review of Systems  Constitutional: Negative for fever, chills, diaphoresis, activity change, appetite change and fatigue.  HENT: Negative for ear pain, nosebleeds, congestion, facial swelling, rhinorrhea, neck pain, neck stiffness and ear discharge.   left lower 6 smaller with small abscess, tender to palpation no drainage or blood noted Eyes: Negative for pain, discharge, redness, itching and visual disturbance.  Respiratory: Negative for cough, choking, chest tightness, shortness of breath, wheezing and stridor.    Objective:    Filed Vitals:   04/22/13 1252  BP: 125/82  Pulse: 65  Temp: 98.1 F (36.7 C)  Resp: 16    Physical Exam  Constitutional: Appears well-developed and well-nourished. No distress.  HENT: Normocephalic. External right and left ear normal. Oropharynx is clear and moist.  Eyes: Conjunctivae and EOM are normal. PERRLA, no scleral icterus.  Neck: Normal ROM. Neck supple. No JVD. No tracheal deviation. No thyromegaly.  CVS: RRR, S1/S2 +, no murmurs, no gallops, no carotid bruit.  Pulmonary: Effort and breath sounds normal, no stridor, rhonchi, wheezes, rales.  Abdominal: Soft. BS +,  no distension, tenderness, rebound or guarding.  Musculoskeletal: Normal range of motion. No edema and no tenderness.  Lymphadenopathy: No lymphadenopathy noted, cervical, inguinal. Neuro: Alert. Normal reflexes, muscle tone coordination. No cranial nerve deficit. Skin: Skin is warm and dry. No rash noted. Not diaphoretic. No erythema. No pallor.  Psychiatric: Normal mood and affect. Behavior, judgment, thought content normal.   Lab Results  Component Value Date   WBC 3.7* 09/27/2011   HGB 14.9 09/27/2011   HCT 44.6 09/27/2011   MCV 82.4 09/27/2011   PLT 211 09/27/2011   Lab Results  Component Value Date   CREATININE 0.99 09/27/2011   BUN 18 09/27/2011   NA 139 09/27/2011   K 3.8 09/27/2011   CL 101 09/27/2011   CO2 28 09/27/2011    No results found for this  basename: HGBA1C   Lipid Panel     Component Value Date/Time   CHOL 199 03/09/2010 2045   TRIG 76 03/09/2010 2045   HDL 48 03/09/2010 2045   CHOLHDL 4.1 Ratio 03/09/2010 2045   VLDL 15 03/09/2010 2045   LDLCALC 136* 03/09/2010 2045       Assessment and plan:   Patient Active Problem List   Diagnosis Date Noted  .  dental abscess  - will prescribe course of antibiotics for 7 days doxycycline, also recommended warm compresses to the area to facilitate drainage, if symptoms do not improve patient advised to see his dentist  09/28/2011

## 2013-04-22 NOTE — Progress Notes (Signed)
PT HERE WITH C/O TOOTH ACHE R SIDE BACK TOOTH X 1 MNTH. STATES HE WAS SEEN BY DENTIST BUT TOLD TO COME HERE.VSS. SMALL ABSCESS NOTED

## 2013-04-29 ENCOUNTER — Telehealth: Payer: Self-pay | Admitting: Family Medicine

## 2013-04-29 NOTE — Telephone Encounter (Signed)
Script inquiry, please call [facility] pharmacy back at [phone].

## 2013-05-01 NOTE — Telephone Encounter (Signed)
Spoke with health dept-they wanted to know if we  Could switch pills  To capsules  Was ok per Dr Thedore Mins

## 2013-05-24 ENCOUNTER — Encounter (HOSPITAL_COMMUNITY): Payer: Self-pay | Admitting: *Deleted

## 2013-05-24 ENCOUNTER — Emergency Department (HOSPITAL_COMMUNITY)
Admission: EM | Admit: 2013-05-24 | Discharge: 2013-05-24 | Disposition: A | Discharge status: Home / Self Care | Payer: Self-pay | Attending: Emergency Medicine | Admitting: Emergency Medicine

## 2013-05-24 DIAGNOSIS — K219 Gastro-esophageal reflux disease without esophagitis: Secondary | ICD-10-CM | POA: Insufficient documentation

## 2013-05-24 DIAGNOSIS — Y92009 Unspecified place in unspecified non-institutional (private) residence as the place of occurrence of the external cause: Secondary | ICD-10-CM | POA: Insufficient documentation

## 2013-05-24 DIAGNOSIS — Z79899 Other long term (current) drug therapy: Secondary | ICD-10-CM | POA: Insufficient documentation

## 2013-05-24 DIAGNOSIS — Y9301 Activity, walking, marching and hiking: Secondary | ICD-10-CM | POA: Insufficient documentation

## 2013-05-24 DIAGNOSIS — M7989 Other specified soft tissue disorders: Secondary | ICD-10-CM

## 2013-05-24 NOTE — ED Provider Notes (Signed)
CSN: [id number]     Arrival date & time 05/24/13  1120 History     First MD Initiated Contact with Patient 05/24/13 1122     Chief Complaint  Patient presents with  . Snake Bite   HPI 52 year old male presents to the ED following a "snake bite."  Mr. Elton reports that was walking in the backyard this am (45 mins ago) and felt a sudden "bite"/injury to his right 2nd toe. He did not see the culprit of his bite/injury (no visible insect, bee, or snake).  He reports that his toe became red and swollen.  No associated SOB, chest pain, N/V.  No other reported symptoms.  Past Medical History  Diagnosis Date  . Leucopenia   . GERD (gastroesophageal reflux disease)    Past Surgical History  Procedure Laterality Date  . Other surgical history      was unable to have childern and had surgery so he could have children 1990  . Biopsy bone marrow  09/21/2011        History reviewed. No pertinent family history. History  Substance Use Topics  . Smoking status: Never Smoker   . Smokeless tobacco: Never Used  . Alcohol Use: Yes    Review of Systems  Constitutional: Negative for fever, chills and diaphoresis.  Respiratory: Negative for chest tightness and shortness of breath.   Cardiovascular: Negative for chest pain and palpitations.  Gastrointestinal: Negative for nausea and vomiting.  Musculoskeletal:       Reports swelling of right 2nd toe.   Allergies  Review of patient's allergies indicates no known allergies.  Home Medications   Current Outpatient Rx  Name  Route  Sig  Dispense  Refill  . azithromycin (ZITHROMAX) 250 MG tablet      Take 4 tabs po together once   4 tablet   0   . doxycycline (VIBRA-TABS) 100 MG tablet   Oral   Take 1 tablet (100 mg total) by mouth 2 (two) times daily.   14 tablet   1   . hydrocortisone 1 % ointment   Topical   Apply topically 2 (two) times daily.   30 g   0   . pantoprazole (PROTONIX) 40 MG tablet   Oral   Take 40 mg by mouth  daily.            BP 147/100  Pulse 97  Temp(Src) 98.1 F (36.7 C) (Oral)  Resp 18  SpO2 100% Physical Exam  Constitutional: He is oriented to person, place, and time. He appears well-developed and well-nourished. No distress.  HENT:  Head: Normocephalic and atraumatic.  Eyes: Pupils are equal, round, and reactive to light.  Neck: Neck supple.  Cardiovascular: Normal rate and regular rhythm.   No murmur heard. Pulmonary/Chest: Effort normal and breath sounds normal. He has no wheezes. He has no rales.  Abdominal: Soft. He exhibits no distension. There is no tenderness.  Musculoskeletal:  Right 2nd toe is swollen with surround erythema.  No visible bite marks or fang marks.  No drainage noted.   Neurological: He is alert and oriented to person, place, and time.    ED Course   Procedures (including critical care time)  Labs Reviewed - No data to display No results found. No diagnosis found.  MDM  52 year old male presents with swollen, erythematous Right 2nd toe. - No visible fang marks suggestive of snake bite. - Most likely secondary to insect bite or bee sting. -  Patient observed and erythema and swelling improved.  Patient discharged home.    Tommie Sams, DO 05/24/13 1512

## 2013-05-24 NOTE — ED Notes (Signed)
Pt. reports walking at home in knee height grass and felt a bite to the right second toe.  Pt. Has c/o "feeling funny all over" after the bite.  Pt. Has swelling and redness noted to the right second and third toes.  Pt. Has white cream over bite area that was given to him by a friend.  Pt.'s swelling and redness marking was marked by me at 1130am.

## 2013-05-28 NOTE — ED Provider Notes (Signed)
I saw and evaluated the patient, reviewed the resident's note and I agree with the findings and plan.   .Face to face Exam:  General:  Awake HEENT:  Atraumatic Resp:  Normal effort Abd:  Nondistended Neuro:No focal weakness  Sherriann Szuch L Hyman Crossan, MD 05/28/13 0809 

## 2013-06-12 ENCOUNTER — Ambulatory Visit: Payer: No Typology Code available for payment source | Attending: Family Medicine | Admitting: Internal Medicine

## 2013-06-12 VITALS — BP 142/87 | HR 78 | Temp 97.5°F | Resp 16 | Wt 164.4 lb

## 2013-06-12 DIAGNOSIS — R21 Rash and other nonspecific skin eruption: Secondary | ICD-10-CM | POA: Insufficient documentation

## 2013-06-12 DIAGNOSIS — K047 Periapical abscess without sinus: Secondary | ICD-10-CM | POA: Insufficient documentation

## 2013-06-12 MED ORDER — HYDROCORTISONE 0.5 % EX CREA: TOPICAL_CREAM | Freq: Two times a day (BID) | CUTANEOUS | Status: DC

## 2013-06-12 MED ORDER — CLINDAMYCIN HCL 150 MG PO CAPS: 450.0000 mg | ORAL_CAPSULE | Freq: Three times a day (TID) | ORAL | Status: AC

## 2013-06-12 MED ORDER — VALACYCLOVIR HCL 1 G PO TABS: 1000.0000 mg | ORAL_TABLET | Freq: Three times a day (TID) | ORAL | Status: DC

## 2013-06-12 NOTE — Progress Notes (Signed)
Patient has rash on left arm Needs dental referral

## 2013-06-12 NOTE — Progress Notes (Signed)
Patient ID: Manuel Brooks, male   DOB: [birth year], 52 y.o.   MRN: [id number]  CC:  HPI: 52 year old male presents to the clinic for multiple complaints. The patient has very poor communication because of language barrier. However he is able to communicate to me that he has noticed multiple areas of maculopapular rash on his left forearm as well as both her shins, for the last couple of months. He has noticed that certain kind of clothing material irritates his skin. He has disposed of his old clothing and started wearing cotton shirts and pants.  He also states that he noticed some blood on his penis after having intercourse with his wife anything for it he may have herpes.  He also complains of dental pain in his right upper palate contains it he may have a tooth abscess   No Known Allergies Past Medical History  Diagnosis Date  . Leucopenia   . GERD (gastroesophageal reflux disease)    Current Outpatient Prescriptions on File Prior to Visit  Medication Sig Dispense Refill  . pantoprazole (PROTONIX) 40 MG tablet Take 40 mg by mouth daily.         No current facility-administered medications on file prior to visit.   History reviewed. No pertinent family history. History   Social History  . Marital Status: Married    Spouse Name: N/A    Number of Children: N/A  . Years of Education: N/A   Occupational History  . Not on file.   Social History Main Topics  . Smoking status: Never Smoker   . Smokeless tobacco: Never Used  . Alcohol Use: Yes  . Drug Use: No  . Sexual Activity: Not Currently   Other Topics Concern  . Not on file   Social History Narrative  . No narrative on file    Review of Systems  Constitutional: Negative for fever, chills, diaphoresis, activity change, appetite change and fatigue.  HENT: Negative for ear pain, nosebleeds, congestion, facial swelling, rhinorrhea, neck pain, neck stiffness and ear discharge.   Eyes: Negative for pain, discharge, redness,  itching and visual disturbance.  Respiratory: Negative for cough, choking, chest tightness, shortness of breath, wheezing and stridor.   Cardiovascular: Negative for chest pain, palpitations and leg swelling.  Gastrointestinal: Negative for abdominal distention.  Genitourinary: Negative for dysuria, urgency, frequency, hematuria, flank pain, decreased urine volume, difficulty urinating and dyspareunia.  Musculoskeletal: Negative for back pain, joint swelling, arthralgias and gait problem.  Neurological: Negative for dizziness, tremors, seizures, syncope, facial asymmetry, speech difficulty, weakness, light-headedness, numbness and headaches.  Hematological: Negative for adenopathy. Does not bruise/bleed easily.  Psychiatric/Behavioral: Negative for hallucinations, behavioral problems, confusion, dysphoric mood, decreased concentration and agitation.    Objective:   Filed Vitals:   06/12/13 1643  BP: 142/87  Pulse: 78  Temp: 97.5 F (36.4 C)  Resp: 16    Physical Exam  Constitutional: Appears well-developed and well-nourished. No distress.  HENT: Normocephalic. External right and left ear normal. Oropharynx is clear and moist.  Eyes: Conjunctivae and EOM are normal. PERRLA, no scleral icterus.  Neck: Normal ROM. Neck supple. No JVD. No tracheal deviation. No thyromegaly.  CVS: RRR, S1/S2 +, no murmurs, no gallops, no carotid bruit.  Pulmonary: Effort and breath sounds normal, no stridor, rhonchi, wheezes, rales.  Abdominal: Soft. BS +,  no distension, tenderness, rebound or guarding.  Musculoskeletal: Normal range of motion. No edema and no tenderness.  Lymphadenopathy: No lymphadenopathy noted, cervical, inguinal. Neuro: Alert. Normal reflexes, muscle tone coordination.  No cranial nerve deficit. Skin: Skin is warm and dry. No rash noted. Not diaphoretic. No erythema. No pallor.  Psychiatric: Normal mood and affect. Behavior, judgment, thought content normal.   Lab Results   Component Value Date   WBC 3.7* 09/27/2011   HGB 14.9 09/27/2011   HCT 44.6 09/27/2011   MCV 82.4 09/27/2011   PLT 211 09/27/2011   Lab Results  Component Value Date   CREATININE 0.99 09/27/2011   BUN 18 09/27/2011   NA 139 09/27/2011   K 3.8 09/27/2011   CL 101 09/27/2011   CO2 28 09/27/2011    No results found for this basename: HGBA1C   Lipid Panel     Component Value Date/Time   CHOL 199 03/09/2010 2045   TRIG 76 03/09/2010 2045   HDL 48 03/09/2010 2045   CHOLHDL 4.1 Ratio 03/09/2010 2045   VLDL 15 03/09/2010 2045   LDLCALC 136* 03/09/2010 2045       Assessment and plan:   Patient Active Problem List   Diagnosis Date Noted  . Leukocytopenia, unspecified 09/28/2011   Dental abscess Will obtain orthopantogram Dentist referral We'll prescribe clindamycin for 7 days    Rash, most likely eczematous dermatitis Will refer to dermatology for skin biopsy   Suspected herpes Valacyclovir for one week Followup in 2 weeks         The patient was given clear instructions to go to ER or return to medical center if symptoms don't improve, worsen or new problems develop. The patient verbalized understanding. The patient was told to call to get any lab results if not heard anything in the next week.

## 2013-06-16 ENCOUNTER — Telehealth: Payer: Self-pay | Admitting: Internal Medicine

## 2013-06-16 MED ORDER — ACYCLOVIR 400 MG PO TABS: 400.0000 mg | ORAL_TABLET | Freq: Every day | ORAL | Status: AC

## 2013-06-16 NOTE — Telephone Encounter (Signed)
Pt says that instead of valacyclovir, pharmacy has told him to switch to Acyclovir.

## 2013-06-16 NOTE — Telephone Encounter (Signed)
Pt received script for Acyclovir.

## 2013-06-26 ENCOUNTER — Ambulatory Visit: Payer: No Typology Code available for payment source

## 2013-08-06 ENCOUNTER — Encounter: Payer: Self-pay | Admitting: Internal Medicine

## 2013-08-06 ENCOUNTER — Ambulatory Visit: Payer: No Typology Code available for payment source | Attending: Internal Medicine | Admitting: Internal Medicine

## 2013-08-06 ENCOUNTER — Other Ambulatory Visit (HOSPITAL_COMMUNITY)
Admission: RE | Admit: 2013-08-06 | Discharge: 2013-08-06 | Disposition: A | Discharge status: Home / Self Care | Payer: No Typology Code available for payment source | Source: Ambulatory Visit | Attending: Internal Medicine | Admitting: Internal Medicine

## 2013-08-06 VITALS — BP 155/97 | HR 74 | Temp 98.1°F | Resp 16 | Ht 64.0 in | Wt 169.0 lb

## 2013-08-06 DIAGNOSIS — N501 Vascular disorders of male genital organs: Secondary | ICD-10-CM

## 2013-08-06 DIAGNOSIS — K0889 Other specified disorders of teeth and supporting structures: Secondary | ICD-10-CM | POA: Insufficient documentation

## 2013-08-06 DIAGNOSIS — N4889 Other specified disorders of penis: Secondary | ICD-10-CM

## 2013-08-06 DIAGNOSIS — Z113 Encounter for screening for infections with a predominantly sexual mode of transmission: Secondary | ICD-10-CM | POA: Insufficient documentation

## 2013-08-06 DIAGNOSIS — K089 Disorder of teeth and supporting structures, unspecified: Secondary | ICD-10-CM | POA: Insufficient documentation

## 2013-08-06 MED ORDER — IBUPROFEN 600 MG PO TABS: 600.0000 mg | ORAL_TABLET | Freq: Three times a day (TID) | ORAL | Status: DC | PRN

## 2013-08-06 MED ORDER — CETYLPYRIDINIUM CHLORIDE 0.05 % MT LIQD: 5.0000 mL | Freq: Three times a day (TID) | OROMUCOSAL | Status: DC

## 2013-08-06 NOTE — Progress Notes (Signed)
Pt is here today for a f/u visit. Pt reports that he is still has an infection that is causing his throat to be swollen. His pain is a 3 today and its burning. Pt also complains that his infection on his penis is still there and is very uncomfortable.

## 2013-08-06 NOTE — Progress Notes (Signed)
Patient ID: Manuel Brooks, male   DOB: 27-Nov-1960, 52 y.o.   MRN: [id number]   Chief complaint Pain over right lower molar tooth    HPI 51 year old [name] with history of leukopenia, who was seen in the clinic 2 months back for dental abscess and given a course of clindamycin returns to the clinic with symptoms of pain over right lower molar tooth with difficulty swallowing for past few days. History limited due to language barrier.  Also has difficulty chewing on that side. Patient denies any fever or chills, denies pus coming out, nausea vomiting abdominal pain or diarrhea, neck chest pain, shortness of breath. He reports having a dental appointment next week. Patient also reports having occasional blood tinge over the frenulum of his penis ( Mostly on the next morning after intercourse) . Was given a course of acyclovir on his last visit with concern for herpes. It appears he's had these symptoms for several months now. He was tested positive for  chlamydia earlier this year and treated with a course of azithromycin.  Denies x of STDs and reports being monogamous.   Vital signs in last 24 hours:  Filed Vitals:   08/06/13 0908  BP: 155/97  Pulse: 74  Temp: 98.1 F (36.7 C)  TempSrc: Oral  Resp: 16  Height: 5\' 4"  (1.626 m)  Weight: 169 lb (76.658 kg)  SpO2: 97%       Physical Exam:  General: Middle aged male in no acute distress. HEENT: no pallor, no icterus, moist oral mucosa, no lymphadenopathy. Dental caries noted b/l, impacted wisdom tooth over right lower molar area. No abscess noted. Heart: Normal  s1 &s2  Regular rate and rhythm, Lungs: Clear to auscultation bilaterally. Abdomen: Soft, nontender, nondistended, positive bowel sounds. Extremities: warm , no edema Neuro: Alert, awake, oriented x3, nonfocal.   Lab Results:  Basic Metabolic Panel:    Component Value Date/Time   NA 139 09/27/2011 1015   K 3.8 09/27/2011 1015   CL 101 09/27/2011 1015   CO2 28 09/27/2011  1015   BUN 18 09/27/2011 1015   CREATININE 0.99 09/27/2011 1015   GLUCOSE 98 09/27/2011 1015   CALCIUM 9.2 09/27/2011 1015   CBC:    Component Value Date/Time   WBC 3.7* 09/27/2011 1015   WBC 4.2 09/21/2011 0710   HGB 14.9 09/27/2011 1015   HGB 14.8 09/21/2011 0710   HCT 44.6 09/27/2011 1015   HCT 44.3 09/21/2011 0710   PLT 211 09/27/2011 1015   PLT 235 09/21/2011 0710   MCV 82.4 09/27/2011 1015   MCV 81.3 09/21/2011 0710   NEUTROABS 1.3* 09/27/2011 1015   NEUTROABS 1.2* 09/21/2011 0710   LYMPHSABS 1.9 09/27/2011 1015   LYMPHSABS 2.4 09/21/2011 0710   MONOABS 0.4 09/27/2011 1015   MONOABS 0.3 09/21/2011 0710   EOSABS 0.2 09/27/2011 1015   EOSABS 0.2 09/21/2011 0710   BASOSABS 0.0 09/27/2011 1015   BASOSABS 0.0 09/21/2011 0710    No results found for this or any previous visit (from the past 240 hour(s)).  Studies/Results: No results found.  Medications: Scheduled Meds: Continuous Infusions: PRN Meds:.    Assessment/Plan: Dental pain  likely due to impacted wisdom tooth ( right lower molar).  Will order oral dental rinse instructed on dental hygeine Has appt with dentist next week. doesnot need abx at this time  Penile bleeding Hx fo chlamydia Will reorder urine for gonorrhea , chlamydia and trichomonas. Informs to be monogamous  Elevated BP Not noted  on prior visits. possibly related to pain and discomfort. Will reassess during next visit.   Will follow up with results.  Follow up in 6 months  Manuel Brooks 08/06/2013, 9:25 AM

## 2013-10-09 ENCOUNTER — Ambulatory Visit: Payer: No Typology Code available for payment source | Attending: Internal Medicine

## 2014-04-09 ENCOUNTER — Ambulatory Visit: Payer: No Typology Code available for payment source | Attending: Internal Medicine

## 2014-06-09 ENCOUNTER — Encounter: Payer: Self-pay | Admitting: Internal Medicine

## 2014-06-09 ENCOUNTER — Ambulatory Visit: Payer: Self-pay | Attending: Internal Medicine | Admitting: Internal Medicine

## 2014-06-09 VITALS — BP 121/73 | HR 79 | Temp 98.1°F | Resp 22 | Ht 63.0 in | Wt 163.3 lb

## 2014-06-09 DIAGNOSIS — M25512 Pain in left shoulder: Secondary | ICD-10-CM

## 2014-06-09 DIAGNOSIS — Z87891 Personal history of nicotine dependence: Secondary | ICD-10-CM | POA: Insufficient documentation

## 2014-06-09 DIAGNOSIS — J309 Allergic rhinitis, unspecified: Secondary | ICD-10-CM

## 2014-06-09 DIAGNOSIS — Z79899 Other long term (current) drug therapy: Secondary | ICD-10-CM | POA: Insufficient documentation

## 2014-06-09 DIAGNOSIS — J302 Other seasonal allergic rhinitis: Secondary | ICD-10-CM

## 2014-06-09 DIAGNOSIS — M25519 Pain in unspecified shoulder: Secondary | ICD-10-CM

## 2014-06-09 DIAGNOSIS — K219 Gastro-esophageal reflux disease without esophagitis: Secondary | ICD-10-CM

## 2014-06-09 MED ORDER — OMEPRAZOLE 20 MG PO CPDR: 20.0000 mg | DELAYED_RELEASE_CAPSULE | Freq: Every day | ORAL | Status: DC

## 2014-06-09 MED ORDER — NAPROXEN 500 MG PO TABS: 500.0000 mg | ORAL_TABLET | Freq: Two times a day (BID) | ORAL | Status: DC

## 2014-06-09 MED ORDER — FLUTICASONE PROPIONATE 50 MCG/ACT NA SUSP: 2.0000 | Freq: Every day | NASAL | Status: DC

## 2014-06-09 NOTE — Patient Instructions (Signed)
Gastroesophageal Reflux Disease, Adult Gastroesophageal reflux disease (GERD) happens when acid from your stomach flows up into the esophagus. When acid comes in contact with the esophagus, the acid causes soreness (inflammation) in the esophagus. Over time, GERD may create small holes (ulcers) in the lining of the esophagus. CAUSES   Increased body weight. This puts pressure on the stomach, making acid rise from the stomach into the esophagus.  Smoking. This increases acid production in the stomach.  Drinking alcohol. This causes decreased pressure in the lower esophageal sphincter (valve or ring of muscle between the esophagus and stomach), allowing acid from the stomach into the esophagus.  Late evening meals and a full stomach. This increases pressure and acid production in the stomach.  A malformed lower esophageal sphincter. Sometimes, no cause is found. SYMPTOMS   Burning pain in the lower part of the mid-chest behind the breastbone and in the mid-stomach area. This may occur twice a week or more often.  Trouble swallowing.  Sore throat.  Dry cough.  Asthma-like symptoms including chest tightness, shortness of breath, or wheezing. DIAGNOSIS  Your caregiver may be able to diagnose GERD based on your symptoms. In some cases, X-rays and other tests may be done to check for complications or to check the condition of your stomach and esophagus. TREATMENT  Your caregiver may recommend over-the-counter or prescription medicines to help decrease acid production. Ask your caregiver before starting or adding any new medicines.  HOME CARE INSTRUCTIONS   Change the factors that you can control. Ask your caregiver for guidance concerning weight loss, quitting smoking, and alcohol consumption.  Avoid foods and drinks that make your symptoms worse, such as:  Caffeine or alcoholic drinks.  Chocolate.  Peppermint or mint flavorings.  Garlic and onions.  Spicy foods.  Citrus fruits,  such as oranges, lemons, or limes.  Tomato-based foods such as sauce, chili, salsa, and pizza.  Fried and fatty foods.  Avoid lying down for the 3 hours prior to your bedtime or prior to taking a nap.  Eat small, frequent meals instead of large meals.  Wear loose-fitting clothing. Do not wear anything tight around your waist that causes pressure on your stomach.  Raise the head of your bed 6 to 8 inches with wood blocks to help you sleep. Extra pillows will not help.  Only take over-the-counter or prescription medicines for pain, discomfort, or fever as directed by your caregiver.  Do not take aspirin, ibuprofen, or other nonsteroidal anti-inflammatory drugs (NSAIDs). SEEK IMMEDIATE MEDICAL CARE IF:   You have pain in your arms, neck, jaw, teeth, or back.  Your pain increases or changes in intensity or duration.  You develop nausea, vomiting, or sweating (diaphoresis).  You develop shortness of breath, or you faint.  Your vomit is green, yellow, black, or looks like coffee grounds or blood.  Your stool is red, bloody, or black. These symptoms could be signs of other problems, such as heart disease, gastric bleeding, or esophageal bleeding. MAKE SURE YOU:   Understand these instructions.  Will watch your condition.  Will get help right away if you are not doing well or get worse. Document Released: 07/18/2005 Document Revised: 12/31/2011 Document Reviewed: 04/27/2011 Exit[employer] Patient Information 2015 [employer]. This information is not intended to replace advice given to you by your health care provider. Make sure you discuss any questions you have with your health care provider.  

## 2014-06-09 NOTE — Progress Notes (Signed)
Patient presents for left shoulder pain. 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

## 2014-06-09 NOTE — Progress Notes (Signed)
Patient ID: Manuel Brooks, male   DOB: 02/02/1961, 53 y.o.   MRN: [id number]  CC: shoulder pain  HPI:  Patient presents today for c/o of left shoulder pain that is sharp for the past two months. Language barriers present which prevent obtaining details.  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  He further c/o of abdominal pain after eating heavy or spicy meals.  He reports that he often wakes up in the middle of the night coughing and gasping for air after belching numerous times.   No Known Allergies Past Medical History  Diagnosis Date  . Leucopenia   . GERD (gastroesophageal reflux disease)   . Allergy    Current Outpatient Prescriptions on File Prior to Visit  Medication Sig Dispense Refill  . Cetylpyridinium Chloride (ANTISEPTIC ORAL RINSE) 0.05 % LIQD Use as directed 5 mLs in the mouth or throat 3 (three) times daily after meals.  500 mL  2  . hydrocortisone cream 0.5 % Apply topically 2 (two) times daily. Apply topically to the rash  30 g  0  . ibuprofen (ADVIL,MOTRIN) 600 MG tablet Take 1 tablet (600 mg total) by mouth every 8 (eight) hours as needed for pain.  30 tablet  0  . pantoprazole (PROTONIX) 40 MG tablet Take 40 mg by mouth daily.         No current facility-administered medications on file prior to visit.   History reviewed. No pertinent family history. History   Social History  . Marital Status: Married    Spouse Name: N/A    Number of Children: N/A  . Years of Education: N/A   Occupational History  . Not on file.   Social History Main Topics  . Smoking status: Former Smoker -- 0.50 packs/day    Types: Cigarettes    Start date: 10/22/1988    Quit date: 10/22/1998  . Smokeless tobacco: Never Used  . Alcohol Use: Yes  . Drug Use: No  . Sexual Activity: Not Currently   Other Topics Concern  . Not on file   Social History Narrative  . No narrative on file   Review of Systems  Constitutional:  Negative for fever, chills and weight loss.  HENT: Positive for congestion.   Eyes: Negative.   Respiratory: Negative.   Cardiovascular: Negative.   Gastrointestinal: Positive for heartburn, vomiting and abdominal pain. Negative for nausea, constipation and blood in stool.  Neurological: Negative for headaches.      Objective:   Filed Vitals:   06/09/14 1659  BP: 121/73  Pulse: 79  Temp: 98.1 F (36.7 C)  Resp: 22   Physical Exam  Cardiovascular: Normal rate, regular rhythm and normal heart sounds.   Pulmonary/Chest: Effort normal and breath sounds normal.  Abdominal: Bowel sounds are normal. He exhibits no distension. There is tenderness (eoigastric).  Musculoskeletal: Normal range of motion. He exhibits no edema and no tenderness.  Neurological: He is alert. He has normal reflexes.     Lab Results  Component Value Date   WBC 3.7* 09/27/2011   HGB 14.9 09/27/2011   HCT 44.6 09/27/2011   MCV 82.4 09/27/2011   PLT 211 09/27/2011   Lab Results  Component Value Date   CREATININE 0.99 09/27/2011   BUN 18 09/27/2011   NA 139 09/27/2011   K 3.8 09/27/2011   CL 101 09/27/2011   CO2 28 09/27/2011    No results found for this  basename: HGBA1C   Lipid Panel     Component Value Date/Time   CHOL 199 03/09/2010 2045   TRIG 76 03/09/2010 2045   HDL 48 03/09/2010 2045   CHOLHDL 4.1 Ratio 03/09/2010 2045   VLDL 15 03/09/2010 2045   LDLCALC 136* 03/09/2010 2045       Assessment and plan:   Keelan was seen today for shoulder pain.  Diagnoses and associated orders for this visit:  Gastroesophageal reflux disease, esophagitis presence not specified - omeprazole (PRILOSEC) 20 MG capsule; Take 1 capsule (20 mg total) by mouth daily.  Pain in joint, shoulder region, left - naproxen (NAPROSYN) 500 MG tablet; Take 1 tablet (500 mg total) by mouth 2 (two) times daily with a meal.  Seasonal allergies - fluticasone (FLONASE) 50 MCG/ACT nasal spray; Place 2 sprays into both nostrils  daily.   Return if symptoms worsen or fail to improve.        Holland Commons, NP-C [facility] [phone] 06/09/2014, 5:17 PM

## 2014-07-22 ENCOUNTER — Ambulatory Visit: Payer: No Typology Code available for payment source | Attending: Internal Medicine | Admitting: Internal Medicine

## 2014-07-22 ENCOUNTER — Encounter: Payer: Self-pay | Admitting: Internal Medicine

## 2014-07-22 VITALS — BP 127/71 | HR 64 | Temp 97.6°F | Resp 20 | Ht 63.0 in | Wt 164.4 lb

## 2014-07-22 DIAGNOSIS — Z87891 Personal history of nicotine dependence: Secondary | ICD-10-CM | POA: Insufficient documentation

## 2014-07-22 DIAGNOSIS — Z79899 Other long term (current) drug therapy: Secondary | ICD-10-CM | POA: Insufficient documentation

## 2014-07-22 DIAGNOSIS — K219 Gastro-esophageal reflux disease without esophagitis: Secondary | ICD-10-CM | POA: Insufficient documentation

## 2014-07-22 DIAGNOSIS — M25512 Pain in left shoulder: Secondary | ICD-10-CM

## 2014-07-22 DIAGNOSIS — Z23 Encounter for immunization: Secondary | ICD-10-CM | POA: Insufficient documentation

## 2014-07-22 MED ORDER — MELOXICAM 15 MG PO TABS: 15.0000 mg | ORAL_TABLET | Freq: Every day | ORAL | Status: DC

## 2014-07-22 MED ORDER — OMEPRAZOLE 20 MG PO CPDR: 20.0000 mg | DELAYED_RELEASE_CAPSULE | Freq: Every day | ORAL | Status: DC

## 2014-07-22 NOTE — Progress Notes (Signed)
Patient ID: Manuel Brooks, male   DOB: 11/04/1960, 53 y.o.   MRN: [id number]  CC: shoulder pain  HPI:  Patient reports that he after taking Naproxen for shoulder pain he has had great improvement in left shoulder pain.  He states that the shoulder pain began again this morning and it feels as if the bones are rubbing together.  He reports that it is beginning to hurt more when he is working affecting his movement.  He states that he did experience some abdominal discomfort after taking Naproxen and would like it to be switched to something different.  He denies swelling, redness, or warmth of the joint.    No Known Allergies Past Medical History  Diagnosis Date  . Leucopenia   . GERD (gastroesophageal reflux disease)   . Allergy    Current Outpatient Prescriptions on File Prior to Visit  Medication Sig Dispense Refill  . fluticasone (FLONASE) 50 MCG/ACT nasal spray Place 2 sprays into both nostrils daily.  16 g  6  . hydrocortisone cream 0.5 % Apply topically 2 (two) times daily. Apply topically to the rash  30 g  0  . omeprazole (PRILOSEC) 20 MG capsule Take 1 capsule (20 mg total) by mouth daily.  30 capsule  3  . Cetylpyridinium Chloride (ANTISEPTIC ORAL RINSE) 0.05 % LIQD Use as directed 5 mLs in the mouth or throat 3 (three) times daily after meals.  500 mL  2  . ibuprofen (ADVIL,MOTRIN) 600 MG tablet Take 1 tablet (600 mg total) by mouth every 8 (eight) hours as needed for pain.  30 tablet  0  . naproxen (NAPROSYN) 500 MG tablet Take 1 tablet (500 mg total) by mouth 2 (two) times daily with a meal.  60 tablet  0  . pantoprazole (PROTONIX) 40 MG tablet Take 40 mg by mouth daily.         No current facility-administered medications on file prior to visit.   History reviewed. No pertinent family history. History   Social History  . Marital Status: Married    Spouse Name: N/A    Number of Children: N/A  . Years of Education: N/A   Occupational History  . Not on file.   Social History  Main Topics  . Smoking status: Former Smoker -- 0.50 packs/day    Types: Cigarettes    Start date: 10/22/1988    Quit date: 10/22/1998  . Smokeless tobacco: Never Used  . Alcohol Use: Yes  . Drug Use: No  . Sexual Activity: Not Currently   Other Topics Concern  . Not on file   Social History Narrative  . No narrative on file    Review of Systems: See HPI   Objective:   Filed Vitals:   07/22/14 1644  BP: 127/71  Pulse: 64  Temp: 97.6 F (36.4 C)  Resp: 20    Physical Exam  Cardiovascular: Normal rate, regular rhythm and normal heart sounds.   Pulmonary/Chest: Effort normal and breath sounds normal.  Musculoskeletal: He exhibits no edema and no tenderness.  Some pain with active left shoulder ROM     Lab Results  Component Value Date   WBC 3.7* 09/27/2011   HGB 14.9 09/27/2011   HCT 44.6 09/27/2011   MCV 82.4 09/27/2011   PLT 211 09/27/2011   Lab Results  Component Value Date   CREATININE 0.99 09/27/2011   BUN 18 09/27/2011   NA 139 09/27/2011   K 3.8 09/27/2011   CL 101 09/27/2011  CO2 28 09/27/2011    No results found for this basename: HGBA1C   Lipid Panel     Component Value Date/Time   CHOL 199 03/09/2010 2045   TRIG 76 03/09/2010 2045   HDL 48 03/09/2010 2045   CHOLHDL 4.1 Ratio 03/09/2010 2045   VLDL 15 03/09/2010 2045   LDLCALC 136* 03/09/2010 2045       Assessment and plan:   Ledarius was seen today for follow-up and shoulder pain.  Diagnoses and associated orders for this visit:  Left shoulder pain - meloxicam (MOBIC) 15 MG tablet; Take 1 tablet (15 mg total) by mouth daily. - Ambulatory referral to Sports Medicine  Gastroesophageal reflux disease, esophagitis presence not specified - omeprazole (PRILOSEC) 20 MG capsule; Take 1 capsule (20 mg total) by mouth daily. Discussed diet and weight with patient relating to acid reflux.  Went over things that may exacerbate acid reflux such as tomatoes, spicy foods, coffee, carbonated beverages,  chocolates, etc.  Advised patient to avoid laying down at least two hours after meals and sleep with HOB elevated.   Need for prophylactic vaccination and inoculation against influenza - Flu Vaccine QUAD 36+ mos PF IM (Fluarix Quad PF) Influenza injection received.  Explained side effects and contraindications to patient. Information sheet given to patient.    Return if symptoms worsen or fail to improve.       Holland Commons, NP-C [facility] [phone] 07/22/2014, 5:12 PM

## 2014-07-22 NOTE — Patient Instructions (Signed)

## 2014-07-22 NOTE — Progress Notes (Signed)
Patient presents for f/u on left shoulder pain Rates 3-4/10 at present

## 2014-07-30 ENCOUNTER — Ambulatory Visit (INDEPENDENT_AMBULATORY_CARE_PROVIDER_SITE_OTHER): Payer: No Typology Code available for payment source | Admitting: Family Medicine

## 2014-07-30 ENCOUNTER — Encounter: Payer: Self-pay | Admitting: Family Medicine

## 2014-07-30 VITALS — BP 130/89 | HR 65 | Ht 63.0 in | Wt 164.0 lb

## 2014-07-30 DIAGNOSIS — S46812A Strain of other muscles, fascia and tendons at shoulder and upper arm level, left arm, initial encounter: Secondary | ICD-10-CM | POA: Insufficient documentation

## 2014-07-30 MED ORDER — NITROGLYCERIN 0.2 MG/HR TD PT24: MEDICATED_PATCH | TRANSDERMAL | Status: DC

## 2014-07-30 NOTE — Patient Instructions (Addendum)
Nitroglycerin Protocol   Apply 1/4 nitroglycerin patch to affected area daily.  Change position of patch within the affected area every 24 hours.  You may experience a headache during the first 1-2 weeks of using the patch, these should subside.  If you experience headaches after beginning nitroglycerin patch treatment, you may take your preferred over the counter pain reliever.  Another side effect of the nitroglycerin patch is skin irritation or rash related to patch adhesive.  Please notify our office if you develop more severe headaches or rash, and stop the patch.  Tendon healing with nitroglycerin patch may require 12 to 24 weeks depending on the extent of injury.  Men should not use if taking Viagra, Cialis, or Levitra.   Do not use if you have migraines or rosacea.   RTC in 4 wks

## 2014-07-30 NOTE — Assessment & Plan Note (Signed)
Partial full thickness tear of the L supraspinatus visualized on US at the posterior footplate of the insertion with about 2 cm of retraction    Reduction in offending activity. Gentle ROM exercises. NSAIDs per medication orders. Nitro 1/4 patch to area, change daily.  Protocol given to pt and explained  F/U in 4-6 weeks for re scan Shoulder strengthening exercises explained and demonstrated for patient.  Nothing above 90 degrees for now. If no improvement at repeat, would consider MRI and possible referral to surgery due to his current work and need for overhead movements.

## 2014-07-30 NOTE — Progress Notes (Signed)
Subjective:    Manuel Brooks is a 53 y.o. male who presents with left shoulder pain.  Pt is L hand dominant and denies previous injury to the area or to the shoulder.  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  This happened two weeks ago and since that point, the pain is worse with any overhead motion or trying to pick things up with his L hand.  Does describe significant weakness and pain waking him up at night.  Denies any paresthesias into his hand.  He has not tried any medication for this and denies any. Discomfort is described as aching. Symptoms are exacerbated by overhead movements and lying on the shoulder. Evaluation to date: none. Therapy to date includes: nothing specific.  Denies workers comp case.   The following portions of the patient's history were reviewed and updated as appropriate: allergies, current medications, past family history, past medical history, past social history, past surgical history and problem list.  Review of Systems Pertinent items are noted in HPI.   Objective:    BP 130/89  Pulse 65  Ht 5\' 3"  (1.6 m)  Wt 164 lb (74.39 kg)  BMI 29.06 kg/m2 Shoulder: Inspection reveals no abnormalities, atrophy or asymmetry. Palpation is normal with no tenderness over AC joint or bicipital groove. ROM is full in all planes. RTC strength 5/5 except supraspinatus 4/5 2/2 weakness at both 45 degrees F/Abduction and empty can  No signs of impingement with negative Neer and Hawkin's tests Speeds and Yergason's tests normal. Positive Obrien's, negative clunk and good stability.  + Crank test, + Compression/Rotation test  Normal scapular function observed. No painful arc and no drop arm sign. No apprehension sign/Negative Jobe's relocation test   Assessment:    Left Partial articular side full thickness tear w/ retraction of the posterior footplate of the supraspinatus about 2 cm.      Plan:     Natural history and expected course discussed. Questions answered. Reduction in offending activity. Gentle ROM exercises. NSAIDs per medication orders. Nitro 1/4 patch to area, change daily.   F/U in 4-6 weeks for re scan Shoulder strengthening exercises explained and demonstrated for patient.  Nothing above 90 degrees for now. If no improvement at repeat, would consider MRI and possible referral to surgery due to his current work and need for overhead movements.

## 2014-08-13 ENCOUNTER — Ambulatory Visit: Payer: No Typology Code available for payment source | Attending: Internal Medicine

## 2014-09-03 ENCOUNTER — Encounter: Payer: Self-pay | Admitting: Family Medicine

## 2014-09-03 ENCOUNTER — Ambulatory Visit (INDEPENDENT_AMBULATORY_CARE_PROVIDER_SITE_OTHER): Payer: Self-pay | Admitting: Family Medicine

## 2014-09-03 VITALS — BP 121/94 | HR 72 | Ht 63.0 in | Wt 164.0 lb

## 2014-09-03 DIAGNOSIS — M7551 Bursitis of right shoulder: Secondary | ICD-10-CM

## 2014-09-03 DIAGNOSIS — M25511 Pain in right shoulder: Secondary | ICD-10-CM | POA: Insufficient documentation

## 2014-09-03 DIAGNOSIS — S46812A Strain of other muscles, fascia and tendons at shoulder and upper arm level, left arm, initial encounter: Secondary | ICD-10-CM

## 2014-09-03 MED ORDER — DICLOFENAC SODIUM 75 MG PO TBEC: 75.0000 mg | DELAYED_RELEASE_TABLET | Freq: Three times a day (TID) | ORAL | Status: DC | PRN

## 2014-09-03 NOTE — Assessment & Plan Note (Signed)
-  Scan 11/13 documents healing of SST tear. -Continue HEP -Rx diclofenac orally as needed for pain -Follow-up in 4-6 weeks if sx persist

## 2014-09-03 NOTE — Progress Notes (Signed)
   Subjective:    Patient ID: Manuel Brooks, male    DOB: 04/30/1961, 53 y.o.  [name] MRN: [id number]  HPI Mr. Manuel Brooks is a 53yo LH dominant male who presents for left shoulder pain follow-up and new problem of right shoulder pain. 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 He was seen at the beginning of October for left shoulder pain, found to be a partial thickness tear of his supraspinatus tendon. He completed 4 weeks of topical NTG. He did a HEP. He had continued to have pain at night, which wakened him from sleep. A friend of his recommended oral voltaren, which he says significantly helped. He also has noticed increasing right supero-lateral shoulder pain. No acute injury. He has aggravation with overhead movements. The voltaren also helps this. He denies any neck pain, weakness, numbness, tingling, or radiation.  Past medical history, social history, medications, and allergies were reviewed and are up to date in the chart.  Review of Systems 7 point review of systems was performed and was otherwise negative unless noted in the history of present illness.     Objective:   Physical Exam BP 121/94 mmHg  Pulse 72  Ht 5\' 3"  (1.6 m)  Wt 164 lb (74.39 kg)  BMI 29.06 kg/m2 GEN: The patient is well-developed well-nourished male and in no acute distress.  He is awake alert and oriented x3. SKIN: warm and well-perfused, no rash  Neuro: Strength 5/5 globally. Sensation intact throughout. DTRs 2/4 bilaterally. No focal deficits. Vasc: +2 bilateral distal pulses. No edema.  MSK: Atrophy: [none]   Cervical ROM [Full]  Shoulder ROM: Right <---> Left   Forward flexion [180]<--->[180] ER at side [60]<--->[60] Abd ER [90]<--->[90] Abd IR [60]<--->[60] IR up back [T6]<--->[T6]  TTP:  AC joint:  [-]  Supraspinatus insertion:  [-]  Subsca/biceps:  [-]  Periscapular:  [-]  Trap:  [-]  Cuff: Impingement/cuff: Hawkins [+ right]   Jobes: [-]   FF strength:[5/5]   ER strength:[5/5]   Abdominal compression  test:[-] Lag signs:[None]  Laxity/instabilty:  [-]  Yergason:  [-]           Speeds:  [-]    Crank:  [-]                     Active Compression: [-]  Neurovascular: [Normal sensation to light touch in median ulnar and radial nerve distribution with good strength in hand intrinsics grip and EPL, 2+ radial pulse.]  Limited musculoskeletal ultrasound: long and short axis views were obtained of the right and left shoulder. The previously noted partial thickness supraspinatus tear on the left appears to have healed. 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 Examination of the right shoulder reveals a prominence of the supraspinatus bursa. The remainder of rotator cuff tendons appear fully intact.     Assessment & Plan:  Please see problem based assessment and plan in the problem list.

## 2014-09-03 NOTE — Patient Instructions (Signed)
-  Continue the exercises twice daily if possible for both shoulders -Take the voltaren as needed. If not covered under insurance, can also try diclofenac, a similar medication for pain and inflammation. -Plan follow-up in 4-6 weeks if pain persists.

## 2014-09-03 NOTE — Assessment & Plan Note (Signed)
-  Scan on 11/13 visualized thickening of supraspinatus bursa, otherwise RCT intact -Rx diclofenac as needed -HEP -follow-up in 4-6 weeks, consider subacromial CST inj if persists

## 2014-09-27 ENCOUNTER — Encounter: Payer: Self-pay | Admitting: Family Medicine

## 2014-09-27 ENCOUNTER — Other Ambulatory Visit: Payer: Self-pay | Admitting: *Deleted

## 2014-09-27 ENCOUNTER — Ambulatory Visit (INDEPENDENT_AMBULATORY_CARE_PROVIDER_SITE_OTHER): Payer: Self-pay | Admitting: Family Medicine

## 2014-09-27 VITALS — BP 124/84 | Ht 63.0 in | Wt 164.0 lb

## 2014-09-27 DIAGNOSIS — M7551 Bursitis of right shoulder: Secondary | ICD-10-CM

## 2014-09-27 DIAGNOSIS — S46812A Strain of other muscles, fascia and tendons at shoulder and upper arm level, left arm, initial encounter: Secondary | ICD-10-CM

## 2014-09-27 DIAGNOSIS — S46812D Strain of other muscles, fascia and tendons at shoulder and upper arm level, left arm, subsequent encounter: Secondary | ICD-10-CM

## 2014-09-27 MED ORDER — DICLOFENAC SODIUM 75 MG PO TBEC: 75.0000 mg | DELAYED_RELEASE_TABLET | Freq: Two times a day (BID) | ORAL | Status: DC

## 2014-09-27 MED ORDER — METHYLPREDNISOLONE ACETATE 40 MG/ML IJ SUSP
40.0000 mg | Freq: Once | INTRAMUSCULAR | Status: AC
  Administered 2014-09-27: 40 mg via INTRA_ARTICULAR

## 2014-09-27 NOTE — Assessment & Plan Note (Signed)
CSI subacromial bursa left shoulder He wanted RIGHt injection as well but I think we will deal with the left one today and he can f/u with Dr Joellyn HaffPick-Jacobs for teh right shoulder if he wiishes in 4 -6 weeks I will refill his diclofenac as that seemed to be helping a lot--no GI symptoms

## 2014-09-27 NOTE — Progress Notes (Signed)
Patient ID: Manuel Brooks, male   DOB: 10/05/1961, 53 y.o.   MRN: [id number]  Manuel Palmerli Nehal - [age] y.o. male MRN [id number]  Date of birth: 02/19/1961    SUBJECTIVE:     Left shoulder pain Dx with a traumatic tear of supraspinatus (partial thickness) by US with some residual pain. Here for follow up. Pain much better but still bothersome at night and with extreme periods of overhead motion. Wants to try injection. He would like BOTH shoulders injected if possible. ROS:     Pertinent review of systems: negative for fever or unusual weight change.   PERTINENT  PMH / PSH FH / / SH:  Past Medical, Surgical, Social, and Family History Reviewed & Updated in the EMR.  Pertinent findings include:  Hx unspecified leukopenia  OBJECTIVE: BP 124/84 mmHg  Ht 5\' 3"  (1.6 m)  Wt 164 lb (74.39 kg)  BMI 29.06 kg/m2  Physical Exam:  Vital signs are reviewed. SHOULDER: LEFT FROm in all planes ---some pain with AROM supraspinatus testing but otherwise full strength. RIGHT FROM. Some pain with supraspinatus testing but no decrease in strength Distally NV intact B UE  INJECTION: Patient was given informed consent, signed copy in the chart. Appropriate time out was taken. Area prepped and draped in usual sterile fashion. 1 cc of methylprednisolone 40 mg/ml plus  4 cc of 1% lidocaine without epinephrine was injected into the left subacromial bursa using a(n) posterior approach. The patient tolerated the procedure well. There were no complications. Post procedure instructions were given.   ASSESSMENT & PLAN:  See problem based charting & AVS for pt instructions.

## 2014-10-08 ENCOUNTER — Ambulatory Visit: Payer: Self-pay | Admitting: Family Medicine

## 2014-10-25 ENCOUNTER — Ambulatory Visit: Payer: Self-pay

## 2014-10-29 ENCOUNTER — Ambulatory Visit (INDEPENDENT_AMBULATORY_CARE_PROVIDER_SITE_OTHER): Payer: Self-pay | Admitting: Family Medicine

## 2014-10-29 VITALS — BP 127/92 | HR 68 | Ht 63.0 in | Wt 164.0 lb

## 2014-10-29 DIAGNOSIS — M25511 Pain in right shoulder: Secondary | ICD-10-CM

## 2014-10-29 MED ORDER — METHYLPREDNISOLONE ACETATE 40 MG/ML IJ SUSP
40.0000 mg | Freq: Once | INTRAMUSCULAR | Status: AC
  Administered 2014-10-29: 40 mg via INTRA_ARTICULAR

## 2014-10-29 NOTE — Progress Notes (Signed)
  Manuel Brooks - 54 y.o. male MRN [id number]  Date of birth: 03/13/1961 Pt here for follow up of: CC: Bilateral Shoulder Pain Long-standing history of bilateral shoulder pain. Initially his left side was most symptomatic and this was injected at the last visit. He reports overall significant improvement and has been able to sleep on the side without difficulty. Now his right shoulder has become more bothersome is most painful when laying directly on the side or reaching overhead. 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 Reports only occasional clicking no significant grinding or popping. No significant weakness no loss of range of motion. No numbness, tingling or upper extremity swelling ROS:  Per HPI.    OBJECTIVE:  VS:   HT:5\' 3"  (160 cm)   WT:164 lb (74.39 kg)  BMI:29.1          BP:(!) 127/92 mmHg  HR:68bpm  TEMP: ( )  RESP:   PHYSICAL EXAM: GENERAL:  adult [place] male. In no discomfort; no respiratory distress   PSYCH: alert and appropriate, good insight   NEURO: sensation is intact to light touch in bilateral upper extremities   VASCULAR:  bilateral radial pulses 2+/4.  No significant edema.   Bilateral Shoulder Exam: Appearance:  overall normal alignment alignment, Normal Contours  Skin: No overlying erythema/ecchymosis.  Palpation:  no pain but minimal intra-articular crepitation on the right, none on the left with axial loading and circumduction TTP over: Right CA ligament, No TTP over: Biceps tendon, bilateral before meals joint   Strength, ROM & OtherTests: Internal Rotation: Normal External Rotation: Abnormal- 5-/5 strength with slight amount of pain on the right, none in the left Empty can: Abnormal- Normal strength, small amount of pain, negative drop arm Hawkins: Normal Neers: Abnormal- Positive on the right, negative on the left Speeds:Normal O'Brien's: Abnormal- Bilateral      Limited MSK Ultrasound of Right Shoulder: Findings: Biceps  Tendon: Normal Pec Major Insertion: Normal Subscapularis Tendon: Normal Supraspinatus Tendon:Abnormal- Small hypoechoic change with evidence of partial thickness tearing, minimal, no significant thickening Infraspinatus/Teres Minor Tendon:Normal AC Joint:Abnormal- Mild degenerative changes  Impression: The above findings are consistent with small partial thickness supraspinatus tear       ASSESSMENT: Problem  Right Shoulder Pain   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  Written consent was obtained. After an appropriate time out was taken the right shoulder was sterilely prepped and injected as below: Prep:    Betadine and alcohol,  Ethel chloride.  Approach:  Posterior Needle:  22-gauge 1.5 Meds:   3 mL of 1% lidocaine, 1 mL of 40 mg Depo-Medrol A bandaid was applied to the area. This procedure was well tolerated and there were no complications.    PLAN: See problem based charting & AVS for additional documentation.  Injection as above  HEP: Wagon wheel exercises, avoid overhead lifting > If no better will need plain film x-rays of the shoulders further evaluate for before meals and glenohumeral arthropathy. > Return in about 6 weeks (around 12/10/2014).

## 2014-11-15 ENCOUNTER — Ambulatory Visit: Payer: Self-pay

## 2014-11-29 ENCOUNTER — Ambulatory Visit (INDEPENDENT_AMBULATORY_CARE_PROVIDER_SITE_OTHER): Payer: Self-pay | Admitting: Family Medicine

## 2014-11-29 ENCOUNTER — Encounter: Payer: Self-pay | Admitting: Family Medicine

## 2014-11-29 VITALS — BP 124/90 | Ht 63.0 in | Wt 164.0 lb

## 2014-11-29 DIAGNOSIS — S46812D Strain of other muscles, fascia and tendons at shoulder and upper arm level, left arm, subsequent encounter: Secondary | ICD-10-CM

## 2014-11-29 MED ORDER — METHYLPREDNISOLONE ACETATE 40 MG/ML IJ SUSP
40.0000 mg | Freq: Once | INTRAMUSCULAR | Status: AC
  Administered 2014-11-29: 40 mg via INTRA_ARTICULAR

## 2014-11-30 NOTE — Assessment & Plan Note (Signed)
Spent a long time today trying to get him to understand that we can't do corticosteroid injections every month. He did have some significant improvement with each of the injections she's had in the last 2 months and this is loud him to perform his job where he was previously having problems with that. He is the only working member of his family. After long discussion with him today I did agree to perform a second left subacromial injection and told him I would not see him back for 2 months do not plan on injecting him when I see him back then. I would hope he can continue on his home exercise program. Long-term am not sure we have a great plan for him.

## 2014-11-30 NOTE — Progress Notes (Signed)
   Subjective:    Patient ID: Manuel Brooks, male    DOB: 12/23/1960, 54 y.o.   MRN: [id number]  HPI Follow-up left shoulder pain. He was diagnosed with a retracted supraspinatus tear in October 2015. He's had home exercise program, nitroglycerin patch treatment and subacromial injection since then. The injections the only thing that has really helped him. 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 Until the injection he had difficulty performing his 8 hour job. At last office visit we also gave him an injection in the right subacromial bursa which she says improved right shoulder pain about 85%. The left shoulder at best is about 40-50% better after injection. He wants to continue getting injections on a regular basis.  Notably he does not bring interpreter and his understanding and mastery of the [place] language is moderate at best.  Review of Systems He's had no unusual weight change, fever, sweats, chills. No numbness or weakness in his upper extremities.    Objective:   Physical Exam Vital signs are reviewed GEN.: Well-developed male no acute distress in SHOULDER: Left. Weakness and supraspinatus testing with pain above 100 in abduction. Difficulty with liftoff test. The shoulder itself is without any sign of erythema or lesion on the skin. VASCULAR: Radial pulses are 2+ bilaterally equal. He has intact and normal capillary refill bilateral hands. NEURO: Sensation to soft touch bilaterally equal and symmetrical in his hands. He has normal grip strength bilaterally.  INJECTION: Patient was given informed consent, signed copy in the chart. Appropriate time out was taken. Area prepped and draped in usual sterile fashion. 1 cc of methylprednisolone 40 mg/ml plus  4 cc of 1% lidocaine without epinephrine was injected into the left subacromial bursa using a(n) posterior approach. The patient tolerated the  procedure well. There were no complications. Post procedure instructions were given.        Assessment & Plan:

## 2015-08-02 ENCOUNTER — Ambulatory Visit: Payer: Self-pay | Attending: Internal Medicine

## 2015-09-30 ENCOUNTER — Ambulatory Visit: Payer: Self-pay

## 2016-02-20 ENCOUNTER — Ambulatory Visit: Payer: Self-pay | Attending: Internal Medicine

## 2016-03-08 ENCOUNTER — Ambulatory Visit: Payer: Self-pay | Admitting: Family Medicine

## 2016-08-02 ENCOUNTER — Encounter (INDEPENDENT_AMBULATORY_CARE_PROVIDER_SITE_OTHER): Payer: Self-pay

## 2016-08-02 ENCOUNTER — Ambulatory Visit: Payer: Self-pay | Attending: Internal Medicine

## 2016-10-25 ENCOUNTER — Encounter: Payer: Self-pay | Admitting: Family Medicine

## 2016-10-25 ENCOUNTER — Ambulatory Visit: Payer: Self-pay | Attending: Family Medicine | Admitting: Family Medicine

## 2016-10-25 VITALS — BP 127/82 | HR 67 | Temp 98.5°F | Ht 65.0 in | Wt 172.0 lb

## 2016-10-25 DIAGNOSIS — Z13228 Encounter for screening for other metabolic disorders: Secondary | ICD-10-CM | POA: Insufficient documentation

## 2016-10-25 DIAGNOSIS — J310 Chronic rhinitis: Secondary | ICD-10-CM | POA: Insufficient documentation

## 2016-10-25 DIAGNOSIS — D72819 Decreased white blood cell count, unspecified: Secondary | ICD-10-CM | POA: Insufficient documentation

## 2016-10-25 DIAGNOSIS — Z1159 Encounter for screening for other viral diseases: Secondary | ICD-10-CM | POA: Insufficient documentation

## 2016-10-25 DIAGNOSIS — K219 Gastro-esophageal reflux disease without esophagitis: Secondary | ICD-10-CM | POA: Insufficient documentation

## 2016-10-25 DIAGNOSIS — R399 Unspecified symptoms and signs involving the genitourinary system: Secondary | ICD-10-CM

## 2016-10-25 DIAGNOSIS — Z1211 Encounter for screening for malignant neoplasm of colon: Secondary | ICD-10-CM

## 2016-10-25 DIAGNOSIS — J302 Other seasonal allergic rhinitis: Secondary | ICD-10-CM

## 2016-10-25 DIAGNOSIS — R32 Unspecified urinary incontinence: Secondary | ICD-10-CM | POA: Insufficient documentation

## 2016-10-25 MED ORDER — TAMSULOSIN HCL 0.4 MG PO CAPS: 0.4000 mg | ORAL_CAPSULE | Freq: Every day | ORAL | 6 refills | Status: DC

## 2016-10-25 MED ORDER — OMEPRAZOLE 20 MG PO CPDR: 20.0000 mg | DELAYED_RELEASE_CAPSULE | Freq: Every day | ORAL | 6 refills | Status: DC

## 2016-10-25 MED ORDER — FLUTICASONE PROPIONATE 50 MCG/ACT NA SUSP: 2.0000 | Freq: Every day | NASAL | 6 refills | Status: DC

## 2016-10-25 MED FILL — ?OMEPRAZOLE DR 20 MG CAPSUL: 20 | 30 days supply | Qty: 30 | Fill #0

## 2016-10-25 MED FILL — FLUTICASONE PROP 50 MCG SPR: 50 | 25 days supply | Qty: 16 | Fill #0

## 2016-10-25 MED FILL — ?TAMSULOSIN HCL 0.4 MG CAP: 0.4 | 30 days supply | Qty: 30 | Fill #0

## 2016-10-25 NOTE — Progress Notes (Signed)
Subjective:  Patient ID: Manuel Brooks, male    DOB: 06/07/1961  Age: 56 y.o. MRN: [id number]  CC: Abdominal Pain and Urinary Incontinence   HPI Manuel Brooks presents Is a 56 year old male with history of GERD, chronic rhinitis who presents today for follow-up visit.  He had complained of abdominal pain previously but then informs me he has no pain today. Epigastric pain occurs only when he eats spicy foods and is relieved by taking his PPI. He requests a refill of Flonase which he uses for allergic rhinitis.  He complains of dribbling after urination but denies straining or nocturia. He would like to have his PSA checked.  Denies chest pain, shortness of breath or pedal edema.  Past Medical History:  Diagnosis Date  . Allergy   . GERD (gastroesophageal reflux disease)   . Leucopenia     Past Surgical History:  Procedure Laterality Date  . BIOPSY BONE MARROW  09/21/2011      . OTHER SURGICAL HISTORY     was unable to have childern and had surgery so he could have children 1990    No Known Allergies   Outpatient Medications Prior to Visit  Medication Sig Dispense Refill  . diclofenac (VOLTAREN) 75 MG EC tablet Take 1 tablet (75 mg total) by mouth 2 (two) times daily. 60 tablet 2  . ibuprofen (ADVIL,MOTRIN) 600 MG tablet Take 1 tablet (600 mg total) by mouth every 8 (eight) hours as needed for pain. 30 tablet 0  . fluticasone (FLONASE) 50 MCG/ACT nasal spray Place 2 sprays into both nostrils daily. 16 g 6  . Cetylpyridinium Chloride (ANTISEPTIC ORAL RINSE) 0.05 % LIQD Use as directed 5 mLs in the mouth or throat 3 (three) times daily after meals. (Patient not taking: Reported on 10/25/2016) 500 mL 2  . hydrocortisone cream 0.5 % Apply topically 2 (two) times daily. Apply topically to the rash (Patient not taking: Reported on 10/25/2016) 30 g 0  . meloxicam (MOBIC) 15 MG tablet Take 1 tablet (15 mg total) by mouth daily. (Patient not taking: Reported on 10/25/2016) 30 tablet 2  . naproxen  (NAPROSYN) 500 MG tablet   0  . nitroGLYCERIN (NITRODUR - DOSED IN MG/24 HR) 0.2 mg/hr patch Apply 1/4 patch to the affected area every 24 hours (Patient not taking: Reported on 10/25/2016) 30 patch 1  . pantoprazole (PROTONIX) 40 MG tablet Take 40 mg by mouth daily.      . phenazopyridine (PYRIDIUM) 200 MG tablet   0  . ciprofloxacin (CIPRO) 500 MG tablet   0  . omeprazole (PRILOSEC) 20 MG capsule Take 1 capsule (20 mg total) by mouth daily. (Patient not taking: Reported on 10/25/2016) 30 capsule 3   No facility-administered medications prior to visit.     ROS Review of Systems  Constitutional: Negative for activity change and appetite change.  HENT: Negative for sinus pressure and sore throat.   Eyes: Negative for visual disturbance.  Respiratory: Negative for cough, chest tightness and shortness of breath.   Cardiovascular: Negative for chest pain and leg swelling.  Gastrointestinal: Negative for abdominal distention, abdominal pain, constipation and diarrhea.  Endocrine: Negative.   Genitourinary: Negative for dysuria.  Musculoskeletal: Negative for joint swelling and myalgias.  Skin: Negative for rash.  Allergic/Immunologic: Negative.   Neurological: Negative for weakness, light-headedness and numbness.  Psychiatric/Behavioral: Negative for dysphoric mood and suicidal ideas.    Objective:  BP 127/82 (BP Location: Right Arm, Patient Position: Sitting, Cuff Size: Large)   Pulse  67   Temp 98.5 F (36.9 C) (Oral)   Ht 5\' 5"  (1.651 m)   Wt 172 lb (78 kg)   SpO2 99%   BMI 28.62 kg/m   BP/Weight 10/25/2016 11/29/2014 10/29/2014  Systolic BP 127 124 127  Diastolic BP 82 90 92  Wt. (Lbs) 172 164 164  BMI 28.62 29.06 29.06      Physical Exam  Constitutional: He is oriented to person, place, and time. He appears well-developed and well-nourished.  Cardiovascular: Normal rate, normal heart sounds and intact distal pulses.   No murmur heard. Pulmonary/Chest: Effort normal and breath  sounds normal. He has no wheezes. He has no rales. He exhibits no tenderness.  Abdominal: Soft. Bowel sounds are normal. He exhibits no distension and no mass. There is no tenderness.  Musculoskeletal: Normal range of motion.  Neurological: He is alert and oriented to person, place, and time.  Skin: Skin is warm.  Psychiatric: He has a normal mood and affect.     Assessment & Plan:   1. Gastroesophageal reflux disease, esophagitis presence not specified Stable - omeprazole (PRILOSEC) 20 MG capsule; Take 1 capsule (20 mg total) by mouth daily.  Dispense: 30 capsule; Refill: 6  2. Chronic seasonal allergic rhinitis due to other allergen Controlled - fluticasone (FLONASE) 50 MCG/ACT nasal spray; Place 2 sprays into both nostrils daily.  Dispense: 16 g; Refill: 6  3. Screening for metabolic disorder - Lipid Panel w/reflex Direct LDL; Future - COMPLETE METABOLIC PANEL WITH GFR; Future  4. Need for hepatitis C screening test - Hepatitis C antibody, reflex; Future  5. Lower urinary tract symptoms - PSA; Future - tamsulosin (FLOMAX) 0.4 MG CAPS capsule; Take 1 capsule (0.4 mg total) by mouth daily.  Dispense: 30 capsule; Refill: 6  6. Special screening for malignant neoplasms, colon - Ambulatory referral to Gastroenterology   Meds ordered this encounter  Medications  . omeprazole (PRILOSEC) 20 MG capsule    Sig: Take 1 capsule (20 mg total) by mouth daily.    Dispense:  30 capsule    Refill:  6  . fluticasone (FLONASE) 50 MCG/ACT nasal spray    Sig: Place 2 sprays into both nostrils daily.    Dispense:  16 g    Refill:  6  . tamsulosin (FLOMAX) 0.4 MG CAPS capsule    Sig: Take 1 capsule (0.4 mg total) by mouth daily.    Dispense:  30 capsule    Refill:  6    Follow-up: Return in about 3 months (around 01/23/2017) for Follow-up on lower urinary tract symptoms.   Jaclyn Shaggy MD

## 2016-10-30 ENCOUNTER — Ambulatory Visit: Payer: Self-pay | Attending: Family Medicine

## 2016-10-30 DIAGNOSIS — R399 Unspecified symptoms and signs involving the genitourinary system: Secondary | ICD-10-CM

## 2016-10-30 DIAGNOSIS — Z1159 Encounter for screening for other viral diseases: Secondary | ICD-10-CM

## 2016-10-30 DIAGNOSIS — Z13228 Encounter for screening for other metabolic disorders: Secondary | ICD-10-CM

## 2016-10-30 LAB — COMPLETE METABOLIC PANEL WITH GFR
ALT: 15 U/L (ref 9–46)
AST: 25 U/L (ref 10–35)
Albumin: 3.9 g/dL (ref 3.6–5.1)
Alkaline Phosphatase: 66 U/L (ref 40–115)
BUN: 14 mg/dL (ref 7–25)
CALCIUM: 9 mg/dL (ref 8.6–10.3)
CHLORIDE: 102 mmol/L (ref 98–110)
CO2: 27 mmol/L (ref 20–31)
Creat: 1 mg/dL (ref 0.70–1.33)
GFR, EST NON AFRICAN AMERICAN: 83 mL/min (ref >=60)
Glucose, Bld: 87 mg/dL (ref 65–99)
Potassium: 3.9 mmol/L (ref 3.5–5.3)
Sodium: 140 mmol/L (ref 135–146)
Total Bilirubin: 0.5 mg/dL (ref 0.2–1.2)
Total Protein: 7.1 g/dL (ref 6.1–8.1)

## 2016-10-30 LAB — LIPID PANEL W/REFLEX DIRECT LDL
CHOLESTEROL: 184 mg/dL (ref <200)
HDL: 52 mg/dL (ref >40)
LDL-Cholesterol: 114 mg/dL — ABNORMAL HIGH
NON-HDL CHOLESTEROL (CALC): 132 mg/dL — AB (ref <130)
TRIGLYCERIDES: 81 mg/dL (ref <150)
Total CHOL/HDL Ratio: 3.5 Ratio (ref <5.0)

## 2016-10-30 LAB — PSA: PSA: 1 ng/mL (ref <=4.0)

## 2016-10-30 NOTE — Progress Notes (Signed)
Patient presents to clinic for lab work only.

## 2016-10-31 LAB — HEPATITIS C ANTIBODY: HCV AB: NEGATIVE

## 2016-11-02 ENCOUNTER — Ambulatory Visit: Payer: Self-pay | Attending: Internal Medicine

## 2016-11-09 ENCOUNTER — Telehealth: Payer: Self-pay

## 2016-11-09 NOTE — Telephone Encounter (Signed)
Writer discussed lab results with patient.  Patient stated understanding.

## 2016-11-09 NOTE — Telephone Encounter (Signed)
-----   Message from Jaclyn ShaggyEnobong Amao, MD sent at 10/31/2016 11:54 AM EST ----- Labs are normal

## 2017-01-21 ENCOUNTER — Ambulatory Visit: Payer: Self-pay | Attending: Family Medicine

## 2017-06-07 ENCOUNTER — Ambulatory Visit: Payer: Self-pay | Attending: Family Medicine

## 2017-07-16 ENCOUNTER — Encounter (HOSPITAL_COMMUNITY): Payer: Self-pay | Admitting: Emergency Medicine

## 2017-07-16 ENCOUNTER — Ambulatory Visit (HOSPITAL_COMMUNITY)
Admission: EM | Admit: 2017-07-16 | Discharge: 2017-07-16 | Disposition: A | Discharge status: Home / Self Care | Payer: Self-pay | Attending: Family Medicine | Admitting: Family Medicine

## 2017-07-16 DIAGNOSIS — R300 Dysuria: Secondary | ICD-10-CM

## 2017-07-16 DIAGNOSIS — Z791 Long term (current) use of non-steroidal anti-inflammatories (NSAID): Secondary | ICD-10-CM | POA: Insufficient documentation

## 2017-07-16 DIAGNOSIS — Z79899 Other long term (current) drug therapy: Secondary | ICD-10-CM | POA: Insufficient documentation

## 2017-07-16 DIAGNOSIS — Z9889 Other specified postprocedural states: Secondary | ICD-10-CM | POA: Insufficient documentation

## 2017-07-16 DIAGNOSIS — K219 Gastro-esophageal reflux disease without esophagitis: Secondary | ICD-10-CM | POA: Insufficient documentation

## 2017-07-16 DIAGNOSIS — Z87891 Personal history of nicotine dependence: Secondary | ICD-10-CM | POA: Insufficient documentation

## 2017-07-16 DIAGNOSIS — M25511 Pain in right shoulder: Secondary | ICD-10-CM | POA: Insufficient documentation

## 2017-07-16 LAB — POCT URINALYSIS DIP (DEVICE)
Bilirubin Urine: NEGATIVE
Glucose, UA: NEGATIVE mg/dL
KETONES UR: NEGATIVE mg/dL
Nitrite: NEGATIVE
PH: 6 (ref 5.0–8.0)
PROTEIN: NEGATIVE mg/dL
SPECIFIC GRAVITY, URINE: 1.025 (ref 1.005–1.030)
Urobilinogen, UA: 0.2 mg/dL (ref 0.0–1.0)

## 2017-07-16 MED ORDER — CIPROFLOXACIN HCL 500 MG PO TABS: 500.0000 mg | ORAL_TABLET | Freq: Two times a day (BID) | ORAL | 0 refills | Status: AC

## 2017-07-16 NOTE — ED Triage Notes (Signed)
Pt. Stated, pain to pee for 5 days.

## 2017-07-16 NOTE — ED Provider Notes (Signed)
[facility]    CSN: [id number] Arrival date & time: 07/16/17  1826     History   Chief Complaint Chief Complaint  Patient presents with  . Dysuria    HPI Manuel Brooks is a 56 y.o. male.   56 year old male comes in for dysuria for the past 5 days. Denies abdominal pain, nausea, vomiting. Denies diarrhea, constipation, last BM today without straining. Denies fever, chills, night sweats. Sexually active with one partner, no condom use. Denies penile pain, discharge, swelling. Denies testicular swelling/pain. Denies urinary frequency, hematuria.       Past Medical History:  Diagnosis Date  . Allergy   . GERD (gastroesophageal reflux disease)   . Leucopenia     Patient Active Problem List   Diagnosis Date Noted  . Right shoulder pain 09/03/2014  . Traumatic tear of supraspinatus tendon of left shoulder 07/30/2014  . Pain, dental 08/06/2013  . Penile bleeding 08/06/2013  . Leukocytopenia, unspecified 09/28/2011    Past Surgical History:  Procedure Laterality Date  . BIOPSY BONE MARROW  09/21/2011      . OTHER SURGICAL HISTORY     was unable to have childern and had surgery so he could have children 1990       Home Medications    Prior to Admission medications   Medication Sig Start Date End Date Taking? Authorizing Provider  bismuth subsalicylate (PEPTO BISMOL) 262 MG/15ML suspension Take 30 mLs by mouth every 6 (six) hours as needed.    [provider]  Cetylpyridinium Chloride (ANTISEPTIC ORAL RINSE) 0.05 % LIQD Use as directed 5 mLs in the mouth or throat 3 (three) times daily after meals. Patient not taking: Reported on 10/25/2016 08/06/13   Dhungel, Theda Belfast, MD  ciprofloxacin (CIPRO) 500 MG tablet Take 1 tablet (500 mg total) by mouth every 12 (twelve) hours. 07/16/17 07/30/17  Belinda Fisher, PA-C  diclofenac (VOLTAREN) 75 MG EC tablet Take 1 tablet (75 mg total) by mouth 2 (two) times daily. 09/27/14   Nestor Ramp, MD  fluticasone (FLONASE) 50  MCG/ACT nasal spray Place 2 sprays into both nostrils daily. 10/25/16   Jaclyn Shaggy, MD  hydrocortisone cream 0.5 % Apply topically 2 (two) times daily. Apply topically to the rash Patient not taking: Reported on 10/25/2016 06/12/13   Richarda Overlie, MD  ibuprofen (ADVIL,MOTRIN) 600 MG tablet Take 1 tablet (600 mg total) by mouth every 8 (eight) hours as needed for pain. 08/06/13   Dhungel, Theda Belfast, MD  meloxicam (MOBIC) 15 MG tablet Take 1 tablet (15 mg total) by mouth daily. Patient not taking: Reported on 10/25/2016 07/22/14   Ambrose Finland, NP  naproxen (NAPROSYN) 500 MG tablet  06/09/14   [provider]  nitroGLYCERIN (NITRODUR - DOSED IN MG/24 HR) 0.2 mg/hr patch Apply 1/4 patch to the affected area every 24 hours Patient not taking: Reported on 10/25/2016 07/30/14   Nestor Ramp, MD  omeprazole (PRILOSEC) 20 MG capsule Take 1 capsule (20 mg total) by mouth daily. 10/25/16   Jaclyn Shaggy, MD  pantoprazole (PROTONIX) 40 MG tablet Take 40 mg by mouth daily.      [provider]  phenazopyridine (PYRIDIUM) 200 MG tablet  06/04/14   [provider]  tamsulosin (FLOMAX) 0.4 MG CAPS capsule Take 1 capsule (0.4 mg total) by mouth daily. 10/25/16   Jaclyn Shaggy, MD    Family History No family history on file.  Social History Social History  Substance Use Topics  .  Smoking status: Former Smoker    Packs/day: 0.50    Types: Cigarettes    Start date: 10/22/1988    Quit date: 10/22/1998  . Smokeless tobacco: Never Used  . Alcohol use No     Allergies   Patient has no known allergies.   Review of Systems Review of Systems  Reason unable to perform ROS: See HPI as above.     Physical Exam Triage Vital Signs ED Triage Vitals  Enc Vitals Group     BP 07/16/17 1912 (!) 156/93     Pulse Rate 07/16/17 1912 71     Resp 07/16/17 1912 17     Temp 07/16/17 1912 (!) 97.3 F (36.3 C)     Temp Source 07/16/17 1912 Oral     SpO2 07/16/17 1912 100 %     Weight 07/16/17 1913  171 lb (77.6 kg)     Height --      Head Circumference --      Peak Flow --      Pain Score 07/16/17 1913 7     Pain Loc --      Pain Edu? --      Excl. in GC? --    No data found.   Updated Vital Signs BP (!) 156/93 (BP Location: Right Arm)   Pulse 71   Temp (!) 97.3 F (36.3 C) (Oral)   Resp 17   Wt 171 lb (77.6 kg)   SpO2 100%   BMI 28.46 kg/m   Physical Exam  Constitutional: He is oriented to person, place, and time. He appears well-developed and well-nourished. No distress.  Eyes: Pupils are equal, round, and reactive to light. Conjunctivae are normal.  Cardiovascular: Normal rate, regular rhythm and normal heart sounds.  Exam reveals no gallop and no friction rub.   No murmur heard. Pulmonary/Chest: Effort normal and breath sounds normal. He has no wheezes. He has no rales.  Abdominal: Soft. Bowel sounds are normal. He exhibits no distension. There is no tenderness. There is no rebound, no guarding and no CVA tenderness.  Neurological: He is alert and oriented to person, place, and time.  Skin: Skin is warm and dry.     UC Treatments / Results  Labs (all labs ordered are listed, but only abnormal results are displayed) Labs Reviewed  POCT URINALYSIS DIP (DEVICE) - Abnormal; Notable for the following:       Result Value   Hgb urine dipstick TRACE (*)    Leukocytes, UA SMALL (*)    All other components within normal limits  URINE CULTURE  URINE CYTOLOGY ANCILLARY ONLY    EKG  EKG Interpretation None       Radiology No results found.  Procedures Procedures (including critical care time)  Medications Ordered in UC Medications - No data to display   Initial Impression / Assessment and Plan / UC Course  I have reviewed the triage vital signs and the nursing notes.  Pertinent labs & imaging results that were available during my care of the patient were reviewed by me and considered in my medical decision making (see chart for details).    Urine  dipstick positive for leukocytes, discussed with patient this could be due to UTI/ prostatitis. Discussed cause with Dr Milus Glazier, and will start treatment for prostatitis.  Start Ciprofloxacin as directed. Push fluids. Cytology sent, patient will be contacted with any positive results that require additional treatment. Patient to refrain from sexual activity for the next 7 days. Patient  with history of frequent dysuria, and has been referred to urology before, although patient has not followed up. Resources provided for patient, stressed importance of follow up. Return precautions given.   Final Clinical Impressions(s) / UC Diagnoses   Final diagnoses:  Dysuria    New Prescriptions New Prescriptions   CIPROFLOXACIN (CIPRO) 500 MG TABLET    Take 1 tablet (500 mg total) by mouth every 12 (twelve) hours.      Belinda Fisher, PA-C 07/16/17 2032

## 2017-07-16 NOTE — Discharge Instructions (Signed)
Your urine was positive for bacteria, this could be due to urinary tract infection, or prostatitis. Start ciprofloxacin as directed. Keep hydrated, your urine should be clear to pale yellow in color. Cytology sent, you will be contacted with any positive results that requires further treatment. Refrain from sexual activity for the next 7 days. Please follow up with urology for evaluation of your symptoms. Monitor for any worsening of symptoms, fever, worsening abdominal pain, nausea/vomiting, flank pain, go to the emergency department for further evaluation.

## 2017-07-17 LAB — URINE CYTOLOGY ANCILLARY ONLY
Chlamydia: POSITIVE — AB
NEISSERIA GONORRHEA: POSITIVE — AB
TRICH (WINDOWPATH): NEGATIVE

## 2017-07-18 LAB — URINE CULTURE: Culture: NO GROWTH

## 2017-07-22 ENCOUNTER — Ambulatory Visit (HOSPITAL_COMMUNITY)
Admission: EM | Admit: 2017-07-22 | Discharge: 2017-07-22 | Disposition: A | Discharge status: Home / Self Care | Payer: Self-pay | Attending: Internal Medicine | Admitting: Internal Medicine

## 2017-07-22 DIAGNOSIS — A749 Chlamydial infection, unspecified: Secondary | ICD-10-CM

## 2017-07-22 DIAGNOSIS — A549 Gonococcal infection, unspecified: Secondary | ICD-10-CM

## 2017-07-22 MED ORDER — CEFTRIAXONE SODIUM 250 MG IJ SOLR
INTRAMUSCULAR | Status: AC
Start: 2017-07-22 — End: 2017-07-22
  Filled 2017-07-22: qty 250

## 2017-07-22 MED ORDER — AZITHROMYCIN 250 MG PO TABS
ORAL_TABLET | ORAL | Status: AC
Start: 2017-07-22 — End: 2017-07-22
  Filled 2017-07-22: qty 4

## 2017-07-22 MED ORDER — AZITHROMYCIN 250 MG PO TABS
1000.0000 mg | ORAL_TABLET | Freq: Once | ORAL | Status: AC
  Administered 2017-07-22: 1000 mg via ORAL

## 2017-07-22 MED ORDER — CEFTRIAXONE SODIUM 250 MG IJ SOLR
250.0000 mg | Freq: Once | INTRAMUSCULAR | Status: AC
  Administered 2017-07-22: 250 mg via INTRAMUSCULAR

## 2017-07-22 MED ORDER — LIDOCAINE HCL (PF) 2 % IJ SOLN
INTRAMUSCULAR | Status: AC
  Filled 2017-07-22: qty 2

## 2017-07-22 NOTE — ED Triage Notes (Signed)
Pt here for treatment of STD per Dr. Rennie Plowman note on his lab work.  Pt states understanding.

## 2017-09-09 ENCOUNTER — Ambulatory Visit: Payer: Self-pay | Attending: Family Medicine

## 2017-09-18 ENCOUNTER — Ambulatory Visit: Payer: Self-pay

## 2017-12-09 ENCOUNTER — Ambulatory Visit: Payer: Self-pay | Attending: Family Medicine

## 2018-02-10 ENCOUNTER — Encounter: Payer: Self-pay | Admitting: Family Medicine

## 2018-02-10 ENCOUNTER — Ambulatory Visit: Payer: Self-pay | Attending: Family Medicine | Admitting: Family Medicine

## 2018-02-10 VITALS — BP 152/92 | HR 72 | Temp 97.8°F | Wt 179.8 lb

## 2018-02-10 DIAGNOSIS — K649 Unspecified hemorrhoids: Secondary | ICD-10-CM | POA: Insufficient documentation

## 2018-02-10 DIAGNOSIS — N3000 Acute cystitis without hematuria: Secondary | ICD-10-CM

## 2018-02-10 DIAGNOSIS — K648 Other hemorrhoids: Secondary | ICD-10-CM

## 2018-02-10 DIAGNOSIS — R399 Unspecified symptoms and signs involving the genitourinary system: Secondary | ICD-10-CM

## 2018-02-10 DIAGNOSIS — R0981 Nasal congestion: Secondary | ICD-10-CM

## 2018-02-10 DIAGNOSIS — Z79899 Other long term (current) drug therapy: Secondary | ICD-10-CM | POA: Insufficient documentation

## 2018-02-10 LAB — POCT URINALYSIS DIPSTICK
Bilirubin, UA: NEGATIVE
Blood, UA: NEGATIVE
Glucose, UA: NEGATIVE
Ketones, UA: NEGATIVE
NITRITE UA: NEGATIVE
PROTEIN UA: NEGATIVE
SPEC GRAV UA: 1.025 (ref 1.010–1.025)
UROBILINOGEN UA: 0.2 U/dL
pH, UA: 6 (ref 5.0–8.0)

## 2018-02-10 MED ORDER — HYDROCORTISONE ACE-PRAMOXINE 1-1 % RE CREA
1.0000 | TOPICAL_CREAM | Freq: Two times a day (BID) | RECTAL | 3 refills | Status: DC
Start: 2018-02-10 — End: 2018-10-10

## 2018-02-10 MED ORDER — TAMSULOSIN HCL 0.4 MG PO CAPS: 0.4000 mg | ORAL_CAPSULE | Freq: Every day | ORAL | 6 refills | Status: DC

## 2018-02-10 MED ORDER — FLUTICASONE PROPIONATE 50 MCG/ACT NA SUSP: 2.0000 | Freq: Every day | NASAL | 3 refills | Status: DC

## 2018-02-10 MED ORDER — CIPROFLOXACIN HCL 500 MG PO TABS: 500.0000 mg | ORAL_TABLET | Freq: Two times a day (BID) | ORAL | 0 refills | Status: DC

## 2018-02-10 MED FILL — FLUTICASONE PROP 50 MCG SPR: 50 | 30 days supply | Qty: 16 | Fill #0

## 2018-02-10 MED FILL — HYDROCORT-PRAMOXINE 1%-1% C: 1-1 | 15 days supply | Qty: 30 | Fill #0

## 2018-02-10 MED FILL — CIPROFLOXACIN HCL 500 MG TA: 500 | 10 days supply | Qty: 20 | Fill #0

## 2018-02-10 MED FILL — TAMSULOSIN HCL 0.4 MG CAP: 0.4 | 30 days supply | Qty: 30 | Fill #0

## 2018-02-10 NOTE — Progress Notes (Signed)
Subjective:  Patient ID: Manuel Brooks, male    DOB: 10/26/1960  Age: 57 y.o. MRN: [id number]  CC: Dysuria  HPI Manuel Palmerli Corday is a 57 year old male who presents today complaining of a 5-day history of dysuria, urinary frequency.  He denies penile discharge or penile pain. He has only one sexual partner-his wife. Of note he was placed on Flomax at his last visit for lower urinary tract symptoms thought to be secondary to BPH however he has run out of this.  Denies flank pain or fevers. Also complains of seeing blood in his stools once in a while after he has driven long distances; he drives for a living; denies constipation. He is requesting refill of Flonase which  he uses for chronic sinusitis.  Past Medical History:  Diagnosis Date  . Allergy   . GERD (gastroesophageal reflux disease)   . Leucopenia     Past Surgical History:  Procedure Laterality Date  . BIOPSY BONE MARROW  09/21/2011      . OTHER SURGICAL HISTORY     was unable to have childern and had surgery so he could have children 1990    No Known Allergies   Outpatient Medications Prior to Visit  Medication Sig Dispense Refill  . bismuth subsalicylate (PEPTO BISMOL) 262 MG/15ML suspension Take 30 mLs by mouth every 6 (six) hours as needed.    . naproxen (NAPROSYN) 500 MG tablet   0  . nitroGLYCERIN (NITRODUR - DOSED IN MG/24 HR) 0.2 mg/hr patch Apply 1/4 patch to the affected area every 24 hours (Patient not taking: Reported on 10/25/2016) 30 patch 1  . omeprazole (PRILOSEC) 20 MG capsule Take 1 capsule (20 mg total) by mouth daily. (Patient not taking: Reported on 02/10/2018) 30 capsule 6  . pantoprazole (PROTONIX) 40 MG tablet Take 40 mg by mouth daily.      . phenazopyridine (PYRIDIUM) 200 MG tablet   0  . Cetylpyridinium Chloride (ANTISEPTIC ORAL RINSE) 0.05 % LIQD Use as directed 5 mLs in the mouth or throat 3 (three) times daily after meals. (Patient not taking: Reported on 10/25/2016) 500 mL 2  . diclofenac (VOLTAREN) 75 MG EC  tablet Take 1 tablet (75 mg total) by mouth 2 (two) times daily. (Patient not taking: Reported on 02/10/2018) 60 tablet 2  . fluticasone (FLONASE) 50 MCG/ACT nasal spray Place 2 sprays into both nostrils daily. (Patient not taking: Reported on 02/10/2018) 16 g 6  . hydrocortisone cream 0.5 % Apply topically 2 (two) times daily. Apply topically to the rash (Patient not taking: Reported on 10/25/2016) 30 g 0  . ibuprofen (ADVIL,MOTRIN) 600 MG tablet Take 1 tablet (600 mg total) by mouth every 8 (eight) hours as needed for pain. (Patient not taking: Reported on 02/10/2018) 30 tablet 0  . meloxicam (MOBIC) 15 MG tablet Take 1 tablet (15 mg total) by mouth daily. (Patient not taking: Reported on 10/25/2016) 30 tablet 2  . tamsulosin (FLOMAX) 0.4 MG CAPS capsule Take 1 capsule (0.4 mg total) by mouth daily. (Patient not taking: Reported on 02/10/2018) 30 capsule 6   No facility-administered medications prior to visit.     ROS Review of Systems  Constitutional: Negative for activity change and appetite change.  HENT: Negative for sinus pressure and sore throat.   Eyes: Negative for visual disturbance.  Respiratory: Negative for cough, chest tightness and shortness of breath.   Cardiovascular: Negative for chest pain and leg swelling.  Gastrointestinal: Negative for abdominal distention, abdominal pain, constipation and diarrhea.  Endocrine: Negative.   Genitourinary: Positive for difficulty urinating and dysuria.  Musculoskeletal: Negative for joint swelling and myalgias.  Skin: Negative for rash.  Allergic/Immunologic: Negative.   Neurological: Negative for weakness, light-headedness and numbness.  Psychiatric/Behavioral: Negative for dysphoric mood and suicidal ideas.    Objective:  BP (!) 152/92   Pulse 72   Temp 97.8 F (36.6 C) (Oral)   Wt 179 lb 12.8 oz (81.6 kg)   SpO2 97%   BMI 29.92 kg/m   BP/Weight 02/10/2018 07/16/2017 10/25/2016  Systolic BP 152 156 127  Diastolic BP 92 93 82  Wt.  (Lbs) 179.8 171 172  BMI 29.92 28.46 28.62      Physical Exam  Constitutional: He is oriented to person, place, and time. He appears well-developed and well-nourished.  HENT:  Right Ear: External ear normal.  Left Ear: External ear normal.  Mouth/Throat: No oropharyngeal exudate.  Cardiovascular: Normal rate, normal heart sounds and intact distal pulses.  No murmur heard. Pulmonary/Chest: Effort normal and breath sounds normal. He has no wheezes. He has no rales. He exhibits no tenderness.  Abdominal: Soft. Bowel sounds are normal. He exhibits no distension and no mass. There is no tenderness.  Musculoskeletal: Normal range of motion.  Neurological: He is alert and oriented to person, place, and time.  Skin: Skin is warm and dry.  Psychiatric: He has a normal mood and affect.     Assessment & Plan:   1. Acute cystitis without hematuria - POCT urinalysis dipstick - Urine cytology ancillary only - Urine Culture - ciprofloxacin (CIPRO) 500 MG tablet; Take 1 tablet (500 mg total) by mouth 2 (two) times daily.  Dispense: 20 tablet; Refill: 0  2. Lower urinary tract symptoms Persisting symptoms due to running out of Flomax - tamsulosin (FLOMAX) 0.4 MG CAPS capsule; Take 1 capsule (0.4 mg total) by mouth daily.  Dispense: 30 capsule; Refill: 6  3. Sinus congestion Stable - fluticasone (FLONASE) 50 MCG/ACT nasal spray; Place 2 sprays into both nostrils daily.  Dispense: 16 g; Refill: 3  4.  Hemorrhoids Placed on Analpram  HCM - refer for colonoscopy at his next visit.  Meds ordered this encounter  Medications  . tamsulosin (FLOMAX) 0.4 MG CAPS capsule    Sig: Take 1 capsule (0.4 mg total) by mouth daily.    Dispense:  30 capsule    Refill:  6  . pramoxine-hydrocortisone (ANALPRAM-HC) 1-1 % rectal cream    Sig: Place 1 application rectally 2 (two) times daily.    Dispense:  30 g    Refill:  3  . fluticasone (FLONASE) 50 MCG/ACT nasal spray    Sig: Place 2 sprays into  both nostrils daily.    Dispense:  16 g    Refill:  3  . ciprofloxacin (CIPRO) 500 MG tablet    Sig: Take 1 tablet (500 mg total) by mouth 2 (two) times daily.    Dispense:  20 tablet    Refill:  0    Follow-up: Return in about 3 months (around 05/12/2018) for Follow-up of chronic medical conditions.   Hoy Register MD

## 2018-02-10 NOTE — Patient Instructions (Signed)
Acute Urinary Retention, Male Acute urinary retention is when you are unable to pee (urinate). Acute urinary retention is common in older men. Prostates can get bigger, which blocks the flow of pee. Follow these instructions at home:  Drink enough fluids to keep your pee clear or pale yellow.  If you are sent home with a tube that drains the bladder (catheter), there will be a drainage bag attached to it. There are two types of bags. One is big that you can wear at night without having to empty it. One is smaller and needs to be emptied more often. ? Keep the drainage bag empty. ? Keep the drainage bag lower than your catheter.  Only take medicine as told by your doctor. Contact a doctor if:  You have a low-grade fever.  You have spasms or you are leaking pee when you have spasms. Get help right away if:  You have chills or a fever.  Your catheter stops draining pee.  Your catheter falls out.  You have increased bleeding that does not stop after you have rested and increased the amount of fluids you had been drinking. This information is not intended to replace advice given to you by your health care provider. Make sure you discuss any questions you have with your health care provider. Document Released: 03/26/2008 Document Revised: 03/15/2016 Document Reviewed: 03/19/2013 Elsevier Interactive Patient Education  2017 [employer]  

## 2018-02-10 NOTE — Progress Notes (Signed)
Patient is having a burning while urinating.

## 2018-02-11 LAB — URINE CYTOLOGY ANCILLARY ONLY
CHLAMYDIA, DNA PROBE: NEGATIVE
NEISSERIA GONORRHEA: NEGATIVE
Trichomonas: NEGATIVE

## 2018-02-12 LAB — URINE CULTURE: Organism ID, Bacteria: NO GROWTH

## 2018-02-13 ENCOUNTER — Telehealth: Payer: Self-pay

## 2018-02-13 NOTE — Telephone Encounter (Signed)
Patient was called and informed of urine results. 

## 2018-05-02 ENCOUNTER — Ambulatory Visit: Payer: Self-pay | Attending: Family Medicine | Admitting: Family Medicine

## 2018-05-02 VITALS — BP 154/98 | HR 58 | Temp 98.2°F | Resp 18 | Ht 63.0 in | Wt 172.0 lb

## 2018-05-02 DIAGNOSIS — Z09 Encounter for follow-up examination after completed treatment for conditions other than malignant neoplasm: Secondary | ICD-10-CM | POA: Insufficient documentation

## 2018-05-02 DIAGNOSIS — K219 Gastro-esophageal reflux disease without esophagitis: Secondary | ICD-10-CM

## 2018-05-02 DIAGNOSIS — R3911 Hesitancy of micturition: Secondary | ICD-10-CM | POA: Insufficient documentation

## 2018-05-02 DIAGNOSIS — M25511 Pain in right shoulder: Secondary | ICD-10-CM | POA: Insufficient documentation

## 2018-05-02 DIAGNOSIS — R030 Elevated blood-pressure reading, without diagnosis of hypertension: Secondary | ICD-10-CM

## 2018-05-02 DIAGNOSIS — Z87891 Personal history of nicotine dependence: Secondary | ICD-10-CM | POA: Insufficient documentation

## 2018-05-02 DIAGNOSIS — R399 Unspecified symptoms and signs involving the genitourinary system: Secondary | ICD-10-CM

## 2018-05-02 DIAGNOSIS — Z79899 Other long term (current) drug therapy: Secondary | ICD-10-CM | POA: Insufficient documentation

## 2018-05-02 DIAGNOSIS — Z791 Long term (current) use of non-steroidal anti-inflammatories (NSAID): Secondary | ICD-10-CM | POA: Insufficient documentation

## 2018-05-02 MED ORDER — TAMSULOSIN HCL 0.4 MG PO CAPS: 0.4000 mg | ORAL_CAPSULE | Freq: Every day | ORAL | 6 refills | Status: DC

## 2018-05-02 MED ORDER — OMEPRAZOLE 20 MG PO CPDR: 20.0000 mg | DELAYED_RELEASE_CAPSULE | Freq: Every day | ORAL | 6 refills | Status: DC

## 2018-05-02 MED FILL — OMEPRAZOLE 20 MG CAP: 20 | 30 days supply | Qty: 30 | Fill #0

## 2018-05-02 NOTE — Patient Instructions (Addendum)
Resume omeprazole daily. Avoid eating heaving meals after 6 pm at night. Drink 6-8 glasses of water per day. Occasionally check your blood pressure at any pharmacy or retail store such as [facility] or [facility]. If readings are persistently greater than 140/90, please return for care.     Reanudar el omeprazol diariamente. Evite comer comidas abundantes despus de las 6 pm en la noche. Beba 6-8 vasos de agua al [employer]. De vez en cuando, revise su presin arterial en cualquier farmacia o tienda minorista como [facility] o [facility]. Si las lecturas son persistentemente mayores a 140/90, por favor regrese para recibir atencin.    Hypertension Hypertension is another name for high blood pressure. High blood pressure forces your heart to work harder to pump blood. This can cause problems over time. There are two numbers in a blood pressure reading. There is a top number (systolic) over a bottom number (diastolic). It is best to have a blood pressure below 120/80. Healthy choices can help lower your blood pressure. You may need medicine to help lower your blood pressure if:  Your blood pressure cannot be lowered with healthy choices.  Your blood pressure is higher than 130/80.  Follow these instructions at home: Eating and drinking  If directed, follow the DASH eating plan. This diet includes: ? Filling half of your plate at each meal with fruits and vegetables. ? Filling one quarter of your plate at each meal with whole grains. Whole grains include whole wheat pasta, brown rice, and whole grain bread. ? Eating or drinking low-fat dairy products, such as skim milk or low-fat yogurt. ? Filling one quarter of your plate at each meal with low-fat (lean) proteins. Low-fat proteins include fish, skinless chicken, eggs, beans, and tofu. ? Avoiding fatty meat, cured and processed meat, or chicken with skin. ? Avoiding premade or processed food.  Eat less than 1,500 mg of salt (sodium) a day.  Limit  alcohol use to no more than 1 drink a day for nonpregnant women and 2 drinks a day for men. One drink equals 12 oz of beer, 5 oz of wine, or 1 oz of hard liquor. Lifestyle  Work with your doctor to stay at a healthy weight or to lose weight. Ask your doctor what the best weight is for you.  Get at least 30 minutes of exercise that causes your heart to beat faster (aerobic exercise) most days of the week. This may include walking, swimming, or biking.  Get at least 30 minutes of exercise that strengthens your muscles (resistance exercise) at least 3 days a week. This may include lifting weights or pilates.  Do not use any products that contain nicotine or tobacco. This includes cigarettes and e-cigarettes. If you need help quitting, ask your doctor.  Check your blood pressure at home as told by your doctor.  Keep all follow-up visits as told by your doctor. This is important. Medicines  Take over-the-counter and prescription medicines only as told by your doctor. Follow directions carefully.  Do not skip doses of blood pressure medicine. The medicine does not work as well if you skip doses. Skipping doses also puts you at risk for problems.  Ask your doctor about side effects or reactions to medicines that you should watch for. Contact a doctor if:  You think you are having a reaction to the medicine you are taking.  You have headaches that keep coming back (recurring).  You feel dizzy.  You have swelling in your ankles.  You have trouble with your vision. Get help right away if:  You get a very bad headache.  You start to feel confused.  You feel weak or numb.  You feel faint.  You get very bad pain in your: ? Chest. ? Belly (abdomen).  You throw up (vomit) more than once.  You have trouble breathing. Summary  Hypertension is another name for high blood pressure.  Making healthy choices can help lower blood pressure. If your blood pressure cannot be controlled  with healthy choices, you may need to take medicine. This information is not intended to replace advice given to you by your health care provider. Make sure you discuss any questions you have with your health care provider. Document Released: 03/26/2008 Document Revised: 09/05/2016 Document Reviewed: 09/05/2016 Elsevier Interactive Patient Education  2018 [employer]      Opciones de alimentos para pacientes con reflujo gastroesofgico - Adultos (Food Choices for Gastroesophageal Reflux Disease, Adult) Cuando se tiene reflujo gastroesofgico (ERGE), los alimentos que se ingieren y los hbitos de alimentacin son Engineer, productionmuy importantes. Elegir los alimentos adecuados puede ayudar a Altria Groupaliviar las molestias. QU PAUTAS DEBO SEGUIR?  Elija las frutas, los vegetales, los cereales integrales y los productos lcteos con bajo contenido de Stephensgrasa.  Elija las carnes de James[place]vaca, de pescado y de ave con bajo contenido de grasas.  Limite las grasas, [address]como los Swarthmoreaceites, los aderezos para Richlandensalada, la Sweetwatermanteca, los frutos secos y Programme researcher, broadcasting/film/videoel aguacate.  Lleve un registro de alimentos. Esto ayuda a identificar los alimentos que ocasionan sntomas.  Evite los alimentos que le ocasionen sntomas. Pueden ser distintos para cada persona.  Haga comidas pequeas durante Glass blower/designerel da en lugar de 3 comidas abundantes.  Coma lentamente, en un lugar donde est distendido.  Limite el consumo de alimentos fritos.  Cocine los alimentos utilizando mtodos que no sean la fritura.  Evite el consumo alcohol.  Evite beber grandes cantidades de lquidos con las comidas.  Evite agacharse o recostarse hasta despus de 2 o 3horas de haber comido.  QU ALIMENTOS NO SE RECOMIENDAN? Estos son algunos alimentos y bebidas que pueden empeorar los sntomas: Veterinary surgeonVegetales Tomates. Jugo de tomate. Salsa de tomate y espagueti. Ajes. Cebolla y Bucknerajo. Rbano picante. Frutas Naranjas, pomelos y limn (fruta y Sloveniajugo). Carnes Carnes de Blue Ridgevaca, de pescado y  de ave con gran contenido de grasas. Esto incluye los perros calientes, las Stoningtoncostillas, el [place], la salchicha, el salame y el tocino. Lcteos Leche entera y Port Mansfieldleche chocolatada. PPG IndustriesCrema cida. Crema. Mantequilla. Helados. Queso crema. Bebidas T o caf. Bebidas gaseosas o bebidas energizantes. Condimentos Salsa picante. Salsa barbacoa. Dulces/postres Chocolate y cacao. Rosquillas. Menta y mentol. Grasas y Du Pontaceites Alimentos muy grasos. Esto incluye las papas fritas. Otros Vinagre. Especias picantes. Esto incluye la pimienta negra, la pimienta blanca, la pimienta roja, la pimienta de cayena, el curry en polvo, los clavos de Beasonolor, el jengibre y el Arubachile en polvo. Esta no es Raytheonuna lista completa de los alimentos y las bebidas que se Theatre stage managerdeben evitar. Comunquese con el nutricionista para recibir ms informacin. Esta informacin no tiene Theme park managercomo fin reemplazar el consejo del mdico. Asegrese de hacerle al mdico cualquier pregunta que tenga. Document Released: 04/08/2012 Document Revised: 10/29/2014 Document Reviewed: 08/12/2013 Elsevier Interactive Patient Education  2017 [employer]

## 2018-05-02 NOTE — Progress Notes (Signed)
Patient ID: Manuel Brooks, male    DOB: 1961-05-16, 57 y.o.   MRN: [id number]  PCP: Hoy Register, MD  Chief Complaint  Patient presents with  . Heartburn    3 month follow-up    Subjective:  HPI Manuel Brooks is a 57 y.o. male who presents for a 3 month follow-up evaluation. Medical history is significant for GERD, urine retention, and allergic rhinitis. Laurent reports that he has been well overall. Continues to have occasional urinary retention, although discontinued Flomax. He continues to have intermittent episodes of heartburn. He is no longer taking omeprazole as he ran out some time ago. He is taking bismuth OTC when needed for heartburn symptoms. He reports associated burning in the throat, burning in the abdomen, and nausea at night time which is worsened by spicy food intake. Upon arrival, patient's blood pressure elevated today. Denies prior history of hypertension. He denies dizziness, headache, chest pain, or weakness.  Doesn't monitor blood pressure at home. Social History   Socioeconomic History  . Marital status: Married    Spouse name: Not on file  . Number of children: Not on file  . Years of education: Not on file  . Highest education level: Not on file  Occupational History  . Not on file  Social Needs  . Financial resource strain: Not on file  . Food insecurity:    Worry: Not on file    Inability: Not on file  . Transportation needs:    Medical: Not on file    Non-medical: Not on file  Tobacco Use  . Smoking status: Former Smoker    Packs/day: 0.50    Types: Cigarettes    Start date: 10/22/1988    Last attempt to quit: 10/22/1998    Years since quitting: 19.5  . Smokeless tobacco: Never Used  Substance and Sexual Activity  . Alcohol use: No  . Drug use: No    Frequency: 1.0 times per week  . Sexual activity: Not Currently  Lifestyle  . Physical activity:    Days per week: Not on file    Minutes per session: Not on file  . Stress: Not on file  Relationships  .  Social connections:    Talks on phone: Not on file    Gets together: Not on file    Attends religious service: Not on file    Active member of club or organization: Not on file    Attends meetings of clubs or organizations: Not on file    Relationship status: Not on file  . Intimate partner violence:    Fear of current or ex partner: Not on file    Emotionally abused: Not on file    Physically abused: Not on file    Forced sexual activity: Not on file  Other Topics Concern  . Not on file  Social History Narrative  . Not on file   History reviewed. No pertinent family history.  Review of Systems Pertinent negatives listed in HPI Patient Active Problem List   Diagnosis Date Noted  . Right shoulder pain 09/03/2014  . Traumatic tear of supraspinatus tendon of left shoulder 07/30/2014  . Pain, dental 08/06/2013  . Penile bleeding 08/06/2013  . Leukocytopenia, unspecified 09/28/2011    No Known Allergies  Prior to Admission medications   Medication Sig Start Date End Date Taking? Authorizing Provider  bismuth subsalicylate (PEPTO BISMOL) 262 MG/15ML suspension Take 30 mLs by mouth every 6 (six) hours as needed.   Yes [provider]  fluticasone (FLONASE) 50 MCG/ACT nasal spray Place 2 sprays into both nostrils daily. 02/10/18  Yes Hoy Register, MD  naproxen (NAPROSYN) 500 MG tablet  06/09/14  Yes [provider]  omeprazole (PRILOSEC) 20 MG capsule Take 1 capsule (20 mg total) by mouth daily. 05/02/18  Yes Bing Neighbors, FNP  pantoprazole (PROTONIX) 40 MG tablet Take 40 mg by mouth daily.     Yes [provider]  pramoxine-hydrocortisone [facility]) 1-1 % rectal cream Place 1 application rectally 2 (two) times daily. 02/10/18  Yes Hoy Register, MD  tamsulosin (FLOMAX) 0.4 MG CAPS capsule Take 1 capsule (0.4 mg total) by mouth daily. 02/10/18  Yes Hoy Register, MD  nitroGLYCERIN (NITRODUR - DOSED IN MG/24 HR) 0.2 mg/hr patch Apply 1/4 patch to  the affected area every 24 hours Patient not taking: Reported on 10/25/2016 07/30/14   Nestor Ramp, MD    Past Medical, Surgical Family and Social History reviewed and updated.    Objective:   Today's Vitals   05/02/18 0838 05/02/18 0901  BP: (!) 141/84 (!) 154/98  Pulse: (!) 58   Resp: 18   Temp: 98.2 F (36.8 C)   TempSrc: Oral   SpO2: 100%   Weight: 172 lb (78 kg)   Height: 5\' 3"  (1.6 m)   PainSc: 0-No pain     Wt Readings from Last 3 Encounters:  05/02/18 172 lb (78 kg)  02/10/18 179 lb 12.8 oz (81.6 kg)  07/16/17 171 lb (77.6 kg)   Physical Exam  Constitutional: He is oriented to person, place, and time. He appears well-developed and well-nourished.  HENT:  Head: Normocephalic and atraumatic.  Eyes: Pupils are equal, round, and reactive to light. Conjunctivae and EOM are normal.  Neck: Normal range of motion.  Cardiovascular: Normal rate, regular rhythm, normal heart sounds and intact distal pulses.  Pulmonary/Chest: Effort normal and breath sounds normal.  Abdominal: Soft. Bowel sounds are normal. He exhibits no distension and no mass. There is no tenderness. There is no rebound and no guarding.  Lymphadenopathy:    He has no cervical adenopathy.  Neurological: He is alert and oriented to person, place, and time.  Skin: Skin is warm and dry.  Psychiatric: He has a normal mood and affect. His behavior is normal. Judgment and thought content normal.   Assessment & Plan:  1. Gastroesophageal reflux disease, esophagitis presence not specified, patient previously prescribed omeprazole for management of acid reflux symptoms however reports he has not taken in quite some time.  I recommend resuming omeprazole 20 mg once daily.  Advised patient to avoid eating after 6 PM and limit intake of spicy foods and fatty foods as these foods can exacerbate acid reflux symptoms and cause heartburn.  2. Elevated BP without diagnosis of hypertension -Advised patient to return in 2  weeks for blood pressure recheck.  Blood pressure was elevated x2 readings today.  He is asymptomatic of any concerning symptoms.  Also advised patient to monitor blood pressure occasionally at any retail facility and keep a record of his readings.  3. Lower urinary tract symptoms, continues to experience some urine hesitancy symptoms when urinating. Patient never started previously prescribed Flomax which was prescribed back in April after he suffered from a urinary tract infection.   Recommend starting a trial of Flomax to see if this helps with his hesitancy and urinary retention.    Meds ordered this encounter  Medications  . omeprazole (PRILOSEC) 20 MG capsule  Sig: Take 1 capsule (20 mg total) by mouth daily.    Dispense:  30 capsule    Refill:  6  . tamsulosin (FLOMAX) 0.4 MG CAPS capsule    Sig: Take 1 capsule (0.4 mg total) by mouth daily.    Dispense:  30 capsule    Refill:  6    Eupha Lobb S. Tiburcio PeaHarris, MSN, [facility] HospitalFNP-C [facility]  [address] [place], [place], [place] [id number] [phone]

## 2018-05-16 ENCOUNTER — Encounter: Payer: Self-pay | Admitting: Pharmacist

## 2018-05-16 ENCOUNTER — Ambulatory Visit: Payer: Self-pay | Attending: Family Medicine | Admitting: Pharmacist

## 2018-05-16 VITALS — BP 160/97 | HR 87

## 2018-05-16 DIAGNOSIS — R030 Elevated blood-pressure reading, without diagnosis of hypertension: Secondary | ICD-10-CM | POA: Insufficient documentation

## 2018-05-16 DIAGNOSIS — Z87891 Personal history of nicotine dependence: Secondary | ICD-10-CM | POA: Insufficient documentation

## 2018-05-16 NOTE — Patient Instructions (Signed)
Thank you for coming to see me today.   Please do the following:  1. Limit salt, increase fresh fruits and vegetables, and increase your exercise to 30 minutes 5 days of the week.  2. Try to record blood pressure at home and bring these in to your next visit.  3. It's really important to make an appointment with Dr. Alvis Lemmings to discuss your blood pressure. She may want to try medication to decrease your blood pressure.  DASH Eating Plan DASH stands for "Dietary Approaches to Stop Hypertension." The DASH eating plan is a healthy eating plan that has been shown to reduce high blood pressure (hypertension). It may also reduce your risk for type 2 diabetes, heart disease, and stroke. The DASH eating plan may also help with weight loss. What are tips for following this plan? General guidelines  Avoid eating more than 2,300 mg (milligrams) of salt (sodium) a day. If you have hypertension, you may need to reduce your sodium intake to 1,500 mg a day.  Limit alcohol intake to no more than 1 drink a day for nonpregnant women and 2 drinks a day for men. One drink equals 12 oz of beer, 5 oz of wine, or 1 oz of hard liquor.  Work with your health care provider to maintain a healthy body weight or to lose weight. Ask what an ideal weight is for you.  Get at least 30 minutes of exercise that causes your heart to beat faster (aerobic exercise) most days of the week. Activities may include walking, swimming, or biking.  Work with your health care provider or diet and nutrition specialist (dietitian) to adjust your eating plan to your individual calorie needs. Reading food labels  Check food labels for the amount of sodium per serving. Choose foods with less than 5 percent of the Daily Value of sodium. Generally, foods with less than 300 mg of sodium per serving fit into this eating plan.  To find whole grains, look for the word "whole" as the first word in the ingredient list. Shopping  Buy products  labeled as "low-sodium" or "no salt added."  Buy fresh foods. Avoid canned foods and premade or frozen meals. Cooking  Avoid adding salt when cooking. Use salt-free seasonings or herbs instead of table salt or sea salt. Check with your health care provider or pharmacist before using salt substitutes.  Do not fry foods. Cook foods using healthy methods such as baking, boiling, grilling, and broiling instead.  Cook with heart-healthy oils, such as olive, canola, soybean, or sunflower oil. Meal planning   Eat a balanced diet that includes: ? 5 or more servings of fruits and vegetables each day. At each meal, try to fill half of your plate with fruits and vegetables. ? Up to 6-8 servings of whole grains each day. ? Less than 6 oz of lean meat, poultry, or fish each day. A 3-oz serving of meat is about the same size as a deck of cards. One egg equals 1 oz. ? 2 servings of low-fat dairy each day. ? A serving of nuts, seeds, or beans 5 times each week. ? Heart-healthy fats. Healthy fats called Omega-3 fatty acids are found in foods such as flaxseeds and coldwater fish, like sardines, salmon, and mackerel.  Limit how much you eat of the following: ? Canned or prepackaged foods. ? Food that is high in trans fat, such as fried foods. ? Food that is high in saturated fat, such as fatty meat. ? Sweets, desserts,  sugary drinks, and other foods with added sugar. ? Full-fat dairy products.  Do not salt foods before eating.  Try to eat at least 2 vegetarian meals each week.  Eat more home-cooked food and less restaurant, buffet, and fast food.  When eating at a restaurant, ask that your food be prepared with less salt or no salt, if possible. What foods are recommended? The items listed may not be a complete list. Talk with your dietitian about what dietary choices are best for you. Grains Whole-grain or whole-wheat bread. Whole-grain or whole-wheat pasta. Brown rice. Modena Morrow. Bulgur.  Whole-grain and low-sodium cereals. Pita bread. Low-fat, low-sodium crackers. Whole-wheat flour tortillas. Vegetables Fresh or frozen vegetables (raw, steamed, roasted, or grilled). Low-sodium or reduced-sodium tomato and vegetable juice. Low-sodium or reduced-sodium tomato sauce and tomato paste. Low-sodium or reduced-sodium canned vegetables. Fruits All fresh, dried, or frozen fruit. Canned fruit in natural juice (without added sugar). Meat and other protein foods Skinless chicken or [place]. Ground chicken or [place]. Pork with fat trimmed off. Fish and seafood. Egg whites. Dried beans, peas, or lentils. Unsalted nuts, nut butters, and seeds. Unsalted canned beans. Lean cuts of beef with fat trimmed off. Low-sodium, lean deli meat. Dairy Low-fat (1%) or fat-free (skim) milk. Fat-free, low-fat, or reduced-fat cheeses. Nonfat, low-sodium ricotta or cottage cheese. Low-fat or nonfat yogurt. Low-fat, low-sodium cheese. Fats and oils Soft margarine without trans fats. Vegetable oil. Low-fat, reduced-fat, or light mayonnaise and salad dressings (reduced-sodium). Canola, safflower, olive, soybean, and sunflower oils. Avocado. Seasoning and other foods Herbs. Spices. Seasoning mixes without salt. Unsalted popcorn and pretzels. Fat-free sweets. What foods are not recommended? The items listed may not be a complete list. Talk with your dietitian about what dietary choices are best for you. Grains Baked goods made with fat, such as croissants, muffins, or some breads. Dry pasta or rice meal packs. Vegetables Creamed or fried vegetables. Vegetables in a cheese sauce. Regular canned vegetables (not low-sodium or reduced-sodium). Regular canned tomato sauce and paste (not low-sodium or reduced-sodium). Regular tomato and vegetable juice (not low-sodium or reduced-sodium). Angie Fava. Olives. Fruits Canned fruit in a light or heavy syrup. Fried fruit. Fruit in cream or butter sauce. Meat and other protein  foods Fatty cuts of meat. Ribs. Fried meat. Berniece Salines. Sausage. Bologna and other processed lunch meats. Salami. Fatback. Hotdogs. Bratwurst. Salted nuts and seeds. Canned beans with added salt. Canned or smoked fish. Whole eggs or egg yolks. Chicken or [place] with skin. Dairy Whole or 2% milk, cream, and half-and-half. Whole or full-fat cream cheese. Whole-fat or sweetened yogurt. Full-fat cheese. Nondairy creamers. Whipped toppings. Processed cheese and cheese spreads. Fats and oils Butter. Stick margarine. Lard. Shortening. Ghee. Bacon fat. Tropical oils, such as coconut, palm kernel, or palm oil. Seasoning and other foods Salted popcorn and pretzels. Onion salt, garlic salt, seasoned salt, table salt, and sea salt. Worcestershire sauce. Tartar sauce. Barbecue sauce. Teriyaki sauce. Soy sauce, including reduced-sodium. Steak sauce. Canned and packaged gravies. Fish sauce. Oyster sauce. Cocktail sauce. Horseradish that you find on the shelf. Ketchup. Mustard. Meat flavorings and tenderizers. Bouillon cubes. Hot sauce and Tabasco sauce. Premade or packaged marinades. Premade or packaged taco seasonings. Relishes. Regular salad dressings. Where to find more information:  National Heart, Lung, and [place]: https://wilson-eaton.com/  American Heart Association: www.heart.org Summary  The DASH eating plan is a healthy eating plan that has been shown to reduce high blood pressure (hypertension). It may also reduce your risk for type 2 diabetes, heart disease,  and stroke.  With the DASH eating plan, you should limit salt (sodium) intake to 2,300 mg a day. If you have hypertension, you may need to reduce your sodium intake to 1,500 mg a day.  When on the DASH eating plan, aim to eat more fresh fruits and vegetables, whole grains, lean proteins, low-fat dairy, and heart-healthy fats.  Work with your health care provider or diet and nutrition specialist (dietitian) to adjust your eating plan to your  individual calorie needs. This information is not intended to replace advice given to you by your health care provider. Make sure you discuss any questions you have with your health care provider. Document Released: 09/27/2011 Document Revised: 10/01/2016 Document Reviewed: 10/01/2016 Elsevier Interactive Patient Education  [employer]2018 Elsevier Inc.

## 2018-05-16 NOTE — Progress Notes (Signed)
   S:   PCP: Dr. Alvis LemmingsNewlin   Patient arrives in good spirits. He is a 57 YO male with no medical history significant for HTN or cardiac conditions. Presents to the clinic for a BP re-check at the request of Jerrilyn CairoKim Harris who last saw the patient on 05/02/18.  Patient was last seen by his PCP on 02/10/2018.   HPI:  OV with Jerrilyn CairoKim Harris 05/02/18: Pt's BP noted to be 141/84. BP was re-checked and resulted as 154/98. Pt reported at that visit that he does not monitor pressure at home. Denied history of hypertension. Ms. Tiburcio PeaHarris instructed patient to monitor pressure at home and return in two weeks for BP re-check.   Today, he denies CP, dizziness, palpitations, or HA. States that he is not monitoring home pressures.  Patient does not take anything for BP currently. He has not taken anything for blood pressure in the past.  Dietary habits include: patient reports high intake of sodium (gives examples of processed food, pizza, and cheese) Exercise habits include: doesn't exercise  Family / Social history:  - FH: denies family history of CVD, HTN, DM  -Tobacco: former smoker - reports quitting ~2003 - Alcohol: denies use   ASCVD risk using PCE: 10.4% Of note, this was calculated with parameters taken from lipid panel that was resulted 10/2016.  O:  BP taken today after 5 minutes rest 160/97  Last 3 Office BP readings: BP Readings from Last 3 Encounters:  05/02/18 (!) 154/98  02/10/18 (!) 152/92  07/16/17 (!) 156/93    BMET    Component Value Date/Time   NA 140 10/30/2016 0839   K 3.9 10/30/2016 0839   CL 102 10/30/2016 0839   CO2 27 10/30/2016 0839   GLUCOSE 87 10/30/2016 0839   BUN 14 10/30/2016 0839   CREATININE 1.00 10/30/2016 0839   CALCIUM 9.0 10/30/2016 0839   GFRNONAA 83 10/30/2016 0839   GFRAA >89 10/30/2016 0839    Renal function: CrCl cannot be calculated (Patient's most recent lab result is older than the maximum 21 days allowed.).  A/P: Pt does not currently have  diagnosis of HTN. However, BP results on two recent visits have been elevated. Of note, we would need some out-of-office readings but patient reports not monitoring at home. I instructed him to make an appointment with his PCP, Dr. Alvis LemmingsNewlin. He has made that appointment and will see her 05/20/18.  In the meantime, patient will increase intake of fresh fruits and vegetables, limit salt to less than 2,300 mg/day (1,500 mg/day would be ideal), and increase physical activity to 150 mins/wk. We have emphasized the importance of these lifestyle changes and their role in controlling blood pressure.   -Recommend lifestyle changes: DASH diet, 150 mins/wk aerobic exercise, limit sodium, and limit caffeine -Recommend for patient to check BP at home, record, and bring readings into next OV -Recommend routine labs at next visit: BMET, lipid panel, CBC as these haven't been performed since 10/2016.  Results reviewed and written information provided.   Total time in face-to-face counseling 15 minutes.   F/U Clinic Visit in 05/20/18.    Mirna MiresAngela Lassiter, PharmD Candidate  [employer]  Butch PennyLuke Van Ausdall, PharmD, CPP Clinical Pharmacist [facility] [phone]

## 2018-05-20 ENCOUNTER — Encounter: Payer: Self-pay | Admitting: Family Medicine

## 2018-05-20 ENCOUNTER — Ambulatory Visit: Payer: Self-pay | Attending: Family Medicine | Admitting: Family Medicine

## 2018-05-20 VITALS — BP 164/98 | HR 87 | Temp 97.8°F | Ht 63.0 in | Wt 172.0 lb

## 2018-05-20 DIAGNOSIS — I10 Essential (primary) hypertension: Secondary | ICD-10-CM | POA: Insufficient documentation

## 2018-05-20 DIAGNOSIS — Z79899 Other long term (current) drug therapy: Secondary | ICD-10-CM | POA: Insufficient documentation

## 2018-05-20 DIAGNOSIS — Z1211 Encounter for screening for malignant neoplasm of colon: Secondary | ICD-10-CM

## 2018-05-20 DIAGNOSIS — K219 Gastro-esophageal reflux disease without esophagitis: Secondary | ICD-10-CM | POA: Insufficient documentation

## 2018-05-20 MED ORDER — LISINOPRIL 5 MG PO TABS: 5.0000 mg | ORAL_TABLET | Freq: Every day | ORAL | 6 refills | Status: DC

## 2018-05-20 MED FILL — TAMSULOSIN HCL 0.4 MG CAP: 0.4 | 30 days supply | Qty: 30 | Fill #0

## 2018-05-20 NOTE — Patient Instructions (Signed)

## 2018-05-20 NOTE — Progress Notes (Signed)
Subjective:  Patient ID: Manuel Brooks, male    DOB: 1961-10-21  Age: 57 y.o. MRN: [id number]  CC: Hypertension   HPI Manuel Brooks is a 57 year old male with a history of GERD who presents today for evaluation of elevated blood pressure which he has had over the last 1 year on different occasions, the last of which was with the clinical pharmacist.  He does not have a known history of hypertension but informs me he has been stressed over recent due to the fact that his brother rented out his house in [place] without his knowledge.  He has had difficult conversations with his brother about this and has been stressed over this. He endorses exercising by means of walking but has not been compliant with a low-sodium diet. Denies chest pains or shortness of breath.  Past Medical History:  Diagnosis Date  . Allergy   . GERD (gastroesophageal reflux disease)   . Leucopenia     Past Surgical History:  Procedure Laterality Date  . BIOPSY BONE MARROW  09/21/2011      . OTHER SURGICAL HISTORY     was unable to have childern and had surgery so he could have children 1990    No Known Allergies   Outpatient Medications Prior to Visit  Medication Sig Dispense Refill  . omeprazole (PRILOSEC) 20 MG capsule Take 1 capsule (20 mg total) by mouth daily. 30 capsule 6  . bismuth subsalicylate (PEPTO BISMOL) 262 MG/15ML suspension Take 30 mLs by mouth every 6 (six) hours as needed.    . fluticasone (FLONASE) 50 MCG/ACT nasal spray Place 2 sprays into both nostrils daily. (Patient not taking: Reported on 05/20/2018) 16 g 3  . naproxen (NAPROSYN) 500 MG tablet   0  . nitroGLYCERIN (NITRODUR - DOSED IN MG/24 HR) 0.2 mg/hr patch Apply 1/4 patch to the affected area every 24 hours (Patient not taking: Reported on 10/25/2016) 30 patch 1  . pramoxine-hydrocortisone (ANALPRAM-HC) 1-1 % rectal cream Place 1 application rectally 2 (two) times daily. (Patient not taking: Reported on 05/20/2018) 30 g 3  . tamsulosin (FLOMAX) 0.4  MG CAPS capsule Take 1 capsule (0.4 mg total) by mouth daily. (Patient not taking: Reported on 05/20/2018) 30 capsule 6  . pantoprazole (PROTONIX) 40 MG tablet Take 40 mg by mouth daily.       No facility-administered medications prior to visit.     ROS Review of Systems  Constitutional: Negative for activity change and appetite change.  HENT: Negative for sinus pressure and sore throat.   Eyes: Negative for visual disturbance.  Respiratory: Negative for cough, chest tightness and shortness of breath.   Cardiovascular: Negative for chest pain and leg swelling.  Gastrointestinal: Negative for abdominal distention, abdominal pain, constipation and diarrhea.  Endocrine: Negative.   Genitourinary: Negative for dysuria.  Musculoskeletal: Negative for joint swelling and myalgias.  Skin: Negative for rash.  Allergic/Immunologic: Negative.   Neurological: Negative for weakness, light-headedness and numbness.  Psychiatric/Behavioral: Negative for dysphoric mood and suicidal ideas.    Objective:  BP (!) 164/98   Pulse 87   Temp 97.8 F (36.6 C) (Oral)   Ht 5\' 3"  (1.6 m)   Wt 172 lb (78 kg)   SpO2 99%   BMI 30.47 kg/m   BP/Weight 05/20/2018 05/16/2018 05/02/2018  Systolic BP 164 160 154  Diastolic BP 98 97 98  Wt. (Lbs) 172 - 172  BMI 30.47 - 30.47      Physical Exam  Constitutional: He is oriented  to person, place, and time. He appears well-developed and well-nourished.  Cardiovascular: Normal rate, normal heart sounds and intact distal pulses.  No murmur heard. Pulmonary/Chest: Effort normal and breath sounds normal. He has no wheezes. He has no rales. He exhibits no tenderness.  Abdominal: Soft. Bowel sounds are normal. He exhibits no distension and no mass. There is no tenderness.  Musculoskeletal: Normal range of motion.  Neurological: He is alert and oriented to person, place, and time.  Skin: Skin is warm and dry.  Psychiatric: He has a normal mood and affect.      Assessment & Plan:   1. Essential hypertension Uncontrolled Commence lisinopril - lisinopril (PRINIVIL,ZESTRIL) 5 MG tablet; Take 1 tablet (5 mg total) by mouth daily.  Dispense: 30 tablet; Refill: 6 - Lipid panel; Future - Comprehensive metabolic panel; Future - Ambulatory referral to Gastroenterology  2. Screening for colon cancer Referred to GI - lisinopril (PRINIVIL,ZESTRIL) 5 MG tablet; Take 1 tablet (5 mg total) by mouth daily.  Dispense: 30 tablet; Refill: 6   Meds ordered this encounter  Medications  . lisinopril (PRINIVIL,ZESTRIL) 5 MG tablet    Sig: Take 1 tablet (5 mg total) by mouth daily.    Dispense:  30 tablet    Refill:  6    Follow-up: Return in about 3 months (around 08/20/2018) for Follow-up of chronic medical conditions.   Hoy RegisterEnobong Amazin Pincock MD

## 2018-05-21 ENCOUNTER — Other Ambulatory Visit: Payer: Self-pay

## 2018-05-21 MED ORDER — TETANUS-DIPHTH-ACELL PERTUSSIS 5-2.5-18.5 LF-MCG/0.5 IM SUSP: 0.5000 mL | INTRAMUSCULAR | 0 refills | Status: DC

## 2018-05-26 ENCOUNTER — Ambulatory Visit: Payer: Self-pay | Attending: Family Medicine

## 2018-05-26 DIAGNOSIS — I10 Essential (primary) hypertension: Secondary | ICD-10-CM | POA: Diagnosis not present

## 2018-05-26 NOTE — Progress Notes (Signed)
Patient here for lab visit only 

## 2018-05-27 LAB — LIPID PANEL
CHOLESTEROL TOTAL: 192 mg/dL (ref 100–199)
Chol/HDL Ratio: 4 ratio (ref 0.0–5.0)
HDL: 48 mg/dL (ref >39)
LDL CALC: 129 mg/dL — AB (ref 0–99)
Triglycerides: 74 mg/dL (ref 0–149)
VLDL Cholesterol Cal: 15 mg/dL (ref 5–40)

## 2018-05-27 LAB — COMPREHENSIVE METABOLIC PANEL
ALK PHOS: 72 IU/L (ref 39–117)
ALT: 14 IU/L (ref 0–44)
AST: 26 IU/L (ref 0–40)
Albumin/Globulin Ratio: 1.6 (ref 1.2–2.2)
Albumin: 4.2 g/dL (ref 3.5–5.5)
BUN/Creatinine Ratio: 12 (ref 9–20)
BUN: 13 mg/dL (ref 6–24)
Bilirubin Total: 0.2 mg/dL (ref 0.0–1.2)
CO2: 23 mmol/L (ref 20–29)
CREATININE: 1.08 mg/dL (ref 0.76–1.27)
Calcium: 9 mg/dL (ref 8.7–10.2)
Chloride: 101 mmol/L (ref 96–106)
GFR calc Af Amer: 86 mL/min/{1.73_m2} (ref >59)
GFR calc non Af Amer: 75 mL/min/{1.73_m2} (ref >59)
GLUCOSE: 98 mg/dL (ref 65–99)
Globulin, Total: 2.7 g/dL (ref 1.5–4.5)
Potassium: 4.2 mmol/L (ref 3.5–5.2)
Sodium: 140 mmol/L (ref 134–144)
Total Protein: 6.9 g/dL (ref 6.0–8.5)

## 2018-05-29 ENCOUNTER — Ambulatory Visit: Payer: Self-pay

## 2018-06-11 ENCOUNTER — Ambulatory Visit: Payer: Self-pay | Attending: Family Medicine

## 2018-08-25 ENCOUNTER — Ambulatory Visit: Payer: Self-pay | Admitting: Family Medicine

## 2018-10-10 ENCOUNTER — Encounter: Payer: Self-pay | Admitting: Family Medicine

## 2018-10-10 ENCOUNTER — Ambulatory Visit (INDEPENDENT_AMBULATORY_CARE_PROVIDER_SITE_OTHER): Payer: Medicaid Other | Admitting: Family Medicine

## 2018-10-10 VITALS — BP 150/96 | HR 74 | Temp 98.2°F | Ht 63.0 in | Wt 174.0 lb

## 2018-10-10 DIAGNOSIS — K219 Gastro-esophageal reflux disease without esophagitis: Secondary | ICD-10-CM

## 2018-10-10 DIAGNOSIS — I10 Essential (primary) hypertension: Secondary | ICD-10-CM

## 2018-10-10 DIAGNOSIS — Z131 Encounter for screening for diabetes mellitus: Secondary | ICD-10-CM | POA: Diagnosis not present

## 2018-10-10 DIAGNOSIS — Z23 Encounter for immunization: Secondary | ICD-10-CM

## 2018-10-10 DIAGNOSIS — Z09 Encounter for follow-up examination after completed treatment for conditions other than malignant neoplasm: Secondary | ICD-10-CM | POA: Diagnosis not present

## 2018-10-10 DIAGNOSIS — R8290 Unspecified abnormal findings in urine: Secondary | ICD-10-CM | POA: Diagnosis not present

## 2018-10-10 DIAGNOSIS — R399 Unspecified symptoms and signs involving the genitourinary system: Secondary | ICD-10-CM

## 2018-10-10 DIAGNOSIS — Z0000 Encounter for general adult medical examination without abnormal findings: Secondary | ICD-10-CM

## 2018-10-10 DIAGNOSIS — Z1211 Encounter for screening for malignant neoplasm of colon: Secondary | ICD-10-CM

## 2018-10-10 LAB — POCT GLYCOSYLATED HEMOGLOBIN (HGB A1C): Hemoglobin A1C: 5 % (ref 4.0–5.6)

## 2018-10-10 LAB — POCT URINALYSIS DIP (MANUAL ENTRY)
Bilirubin, UA: NEGATIVE
Glucose, UA: NEGATIVE mg/dL
Ketones, POC UA: NEGATIVE mg/dL
Leukocytes, UA: NEGATIVE
Nitrite, UA: NEGATIVE
Protein Ur, POC: NEGATIVE mg/dL
Spec Grav, UA: 1.02 (ref 1.010–1.025)
Urobilinogen, UA: 0.2 E.U./dL
pH, UA: 6 (ref 5.0–8.0)

## 2018-10-10 MED ORDER — HYDROCORTISONE ACE-PRAMOXINE 1-1 % RE CREA
1.0000 | TOPICAL_CREAM | Freq: Two times a day (BID) | RECTAL | 1 refills | Status: DC
Start: 2018-10-10 — End: 2019-05-12

## 2018-10-10 MED ORDER — TAMSULOSIN HCL 0.4 MG PO CAPS: 0.4000 mg | ORAL_CAPSULE | Freq: Every day | ORAL | 1 refills | Status: DC

## 2018-10-10 MED ORDER — OMEPRAZOLE 20 MG PO CPDR: 20.0000 mg | DELAYED_RELEASE_CAPSULE | Freq: Every day | ORAL | 1 refills | Status: DC

## 2018-10-10 MED ORDER — LISINOPRIL 5 MG PO TABS: 5.0000 mg | ORAL_TABLET | Freq: Every day | ORAL | 1 refills | Status: DC

## 2018-10-10 MED ORDER — NITROGLYCERIN 0.2 MG/HR TD PT24: MEDICATED_PATCH | TRANSDERMAL | 11 refills | Status: DC

## 2018-10-10 NOTE — Progress Notes (Signed)
New Patient--Establish Care  Subjective:    Patient ID: Manuel Brooks, male    DOB: 12/03/1960, 57 y.o.   MRN: [id number]  Chief Complaint  Patient presents with  . Establish Care   HPI Manuel Brooks is a 57 year old male with a past medical history of Leukopenia, Hypertension, GERD, and Allergies. He is here today to establish care.   Current Status: Since his last office visit to Urgent Care, he is doing well with no complaints. He is formally a patient of [facility] after he last his [employer] benefits. He was recently approved for Medicaid.  He has occasional headaches, which he takes OTC pain meds for relief. He is originally from [place], and has been in [place]. He is accompanied by an interpreter today. He states that he has not been taking any of his prescribed medications for a couple of months.  He denies visual changes, chest pain, cough, shortness of breath, heart palpitations, and falls. He has occasional and dizziness with position changes. Denies severe headaches, confusion, seizures, double vision, and blurred vision, nausea and vomiting.  He denies fevers, chills, fatigue, recent infections, weight loss, and night sweats. No reports of GI problems such as nausea, vomiting, diarrhea, and constipation. He has no reports of blood in stools, dysuria and hematuria. No depression or anxiety reported. He denies pain today.   Review of Systems  Constitutional: Negative.   HENT: Negative.   Eyes: Negative.   Respiratory: Negative.   Cardiovascular: Negative.   Gastrointestinal: Positive for abdominal pain (mild).  Endocrine: Negative.   Genitourinary: Positive for difficulty urinating.  Musculoskeletal: Positive for back pain (bilateral lower back pain).  Skin: Negative.   Allergic/Immunologic: Negative.   Neurological: Positive for headaches (Occasional ).  Hematological: Negative.   Psychiatric/Behavioral: Negative.    Objective:   Physical Exam Vitals  signs and nursing note reviewed.  Constitutional:      Appearance: Normal appearance. He is normal weight.  HENT:     Head: Normocephalic and atraumatic.     Right Ear: Tympanic membrane, ear canal and external ear normal.     Left Ear: Tympanic membrane, ear canal and external ear normal.     Nose: Nose normal.     Mouth/Throat:     Mouth: Mucous membranes are moist.     Pharynx: Oropharynx is clear.  Eyes:     Extraocular Movements: Extraocular movements intact.     Pupils: Pupils are equal, round, and reactive to light.  Neck:     Musculoskeletal: Normal range of motion and neck supple.  Cardiovascular:     Rate and Rhythm: Normal rate and regular rhythm.     Pulses: Normal pulses.     Heart sounds: Normal heart sounds.  Pulmonary:     Effort: Pulmonary effort is normal.     Breath sounds: Normal breath sounds.  Abdominal:     General: Abdomen is flat. Bowel sounds are normal.     Palpations: Abdomen is soft.  Musculoskeletal: Normal range of motion.  Skin:    General: Skin is warm and dry.  Neurological:     General: No focal deficit present.     Mental Status: He is alert and oriented to person, place, and time.  Psychiatric:        Mood and Affect: Mood normal.        Behavior: Behavior normal.        Thought Content: Thought content normal.  Judgment: Judgment normal.    Assessment & Plan:   1. Essential hypertension Blood pressure is elevated today. He will resume taking all prescribed medications today. Refills sent to pharmacy. He will continue to decrease high sodium intake, excessive alcohol intake, increase potassium intake, smoking cessation, and increase physical activity of at least 30 minutes of cardio activity daily. He will continue to follow Heart Healthy or DASH diet. - lisinopril (PRINIVIL,ZESTRIL) 5 MG tablet; Take 1 tablet (5 mg total) by mouth daily.  Dispense: 30 tablet; Refill: 1  2. Need for immunization against influenza - Flu Vaccine  QUAD 36+ mos IM  3. Gastroesophageal reflux disease, esophagitis presence not specified - omeprazole (PRILOSEC) 20 MG capsule; Take 1 capsule (20 mg total) by mouth daily.  Dispense: 30 capsule; Refill: 1  4. Lower urinary tract symptoms - tamsulosin (FLOMAX) 0.4 MG CAPS capsule; Take 1 capsule (0.4 mg total) by mouth daily.  Dispense: 30 capsule; Refill: 1  5. Screening for diabetes mellitus Hgb A1c is within normal range at 5.0 today.  He will continue to decrease foods/beverages high in sugars and carbs and follow Heart Healthy or DASH diet. Increase physical activity to at least 30 minutes cardio exercise daily.  - POCT glycosylated hemoglobin (Hb A1C) - POCT urinalysis dipstick  6. Abnormal urinalysis Results are pending.  - Urine Culture  7. Healthcare maintenance - PSA - CBC with Differential - Comprehensive metabolic panel - TSH  8. Colon cancer screening We will reassess at next office visit.   9. Follow up He will follow up in 1 month to reassess blood pressure and medication compliance.   Meds ordered this encounter  Medications  . lisinopril (PRINIVIL,ZESTRIL) 5 MG tablet    Sig: Take 1 tablet (5 mg total) by mouth daily.    Dispense:  30 tablet    Refill:  1  . nitroGLYCERIN (NITRODUR - DOSED IN MG/24 HR) 0.2 mg/hr patch    Sig: Apply 1/4 patch to the affected area every 24 hours    Dispense:  30 patch    Refill:  11  . omeprazole (PRILOSEC) 20 MG capsule    Sig: Take 1 capsule (20 mg total) by mouth daily.    Dispense:  30 capsule    Refill:  1  . pramoxine-hydrocortisone (ANALPRAM-HC) 1-1 % rectal cream    Sig: Place 1 application rectally 2 (two) times daily.    Dispense:  30 g    Refill:  1  . tamsulosin (FLOMAX) 0.4 MG CAPS capsule    Sig: Take 1 capsule (0.4 mg total) by mouth daily.    Dispense:  30 capsule    Refill:  1    Raliegh IpNatalie Ellajane Stong,  MSN, FNP-C Patient [facility] [address]  [place], [place]  [id number] [phone]

## 2018-10-11 LAB — COMPREHENSIVE METABOLIC PANEL
ALT: 17 IU/L (ref 0–44)
AST: 21 IU/L (ref 0–40)
Albumin/Globulin Ratio: 1.6 (ref 1.2–2.2)
Albumin: 4.4 g/dL (ref 3.5–5.5)
Alkaline Phosphatase: 73 IU/L (ref 39–117)
BUN/Creatinine Ratio: 16 (ref 9–20)
BUN: 16 mg/dL (ref 6–24)
Bilirubin Total: 0.4 mg/dL (ref 0.0–1.2)
CO2: 26 mmol/L (ref 20–29)
Calcium: 9.6 mg/dL (ref 8.7–10.2)
Chloride: 103 mmol/L (ref 96–106)
Creatinine, Ser: 1.01 mg/dL (ref 0.76–1.27)
GFR calc Af Amer: 94 mL/min/{1.73_m2} (ref >59)
GFR calc non Af Amer: 81 mL/min/{1.73_m2} (ref >59)
Globulin, Total: 2.7 g/dL (ref 1.5–4.5)
Glucose: 102 mg/dL — ABNORMAL HIGH (ref 65–99)
Potassium: 4.1 mmol/L (ref 3.5–5.2)
Sodium: 143 mmol/L (ref 134–144)
Total Protein: 7.1 g/dL (ref 6.0–8.5)

## 2018-10-11 LAB — CBC WITH DIFFERENTIAL/PLATELET
Basophils Absolute: 0 10*3/uL (ref 0.0–0.2)
Basos: 1 %
EOS (ABSOLUTE): 0.2 10*3/uL (ref 0.0–0.4)
Eos: 6 %
Hematocrit: 44.8 % (ref 37.5–51.0)
Hemoglobin: 15.3 g/dL (ref 13.0–17.7)
Immature Grans (Abs): 0 10*3/uL (ref 0.0–0.1)
Immature Granulocytes: 0 %
Lymphocytes Absolute: 1.4 10*3/uL (ref 0.7–3.1)
Lymphs: 47 %
MCH: 27.2 pg (ref 26.6–33.0)
MCHC: 34.2 g/dL (ref 31.5–35.7)
MCV: 80 fL (ref 79–97)
Monocytes Absolute: 0.3 10*3/uL (ref 0.1–0.9)
Monocytes: 10 %
Neutrophils Absolute: 1 10*3/uL — ABNORMAL LOW (ref 1.4–7.0)
Neutrophils: 36 %
Platelets: 277 10*3/uL (ref 150–450)
RBC: 5.63 x10E6/uL (ref 4.14–5.80)
RDW: 13.9 % (ref 12.3–15.4)
WBC: 2.9 10*3/uL — ABNORMAL LOW (ref 3.4–10.8)

## 2018-10-11 LAB — PSA: Prostate Specific Ag, Serum: 1.1 ng/mL (ref 0.0–4.0)

## 2018-10-11 LAB — TSH: TSH: 0.956 u[IU]/mL (ref 0.450–4.500)

## 2018-10-12 LAB — URINE CULTURE: Organism ID, Bacteria: NO GROWTH

## 2018-11-11 ENCOUNTER — Encounter: Payer: Self-pay | Admitting: Family Medicine

## 2018-11-11 ENCOUNTER — Ambulatory Visit (INDEPENDENT_AMBULATORY_CARE_PROVIDER_SITE_OTHER): Payer: Medicaid Other | Admitting: Family Medicine

## 2018-11-11 VITALS — BP 144/90 | HR 74 | Temp 98.0°F | Ht 63.0 in | Wt 175.0 lb

## 2018-11-11 DIAGNOSIS — R030 Elevated blood-pressure reading, without diagnosis of hypertension: Secondary | ICD-10-CM | POA: Diagnosis not present

## 2018-11-11 DIAGNOSIS — Z09 Encounter for follow-up examination after completed treatment for conditions other than malignant neoplasm: Secondary | ICD-10-CM

## 2018-11-11 DIAGNOSIS — I10 Essential (primary) hypertension: Secondary | ICD-10-CM

## 2018-11-11 DIAGNOSIS — K219 Gastro-esophageal reflux disease without esophagitis: Secondary | ICD-10-CM | POA: Diagnosis not present

## 2018-11-11 LAB — POCT URINALYSIS DIP (MANUAL ENTRY)
Bilirubin, UA: NEGATIVE
Glucose, UA: NEGATIVE mg/dL
Ketones, POC UA: NEGATIVE mg/dL
Leukocytes, UA: NEGATIVE
Nitrite, UA: NEGATIVE
Protein Ur, POC: NEGATIVE mg/dL
Spec Grav, UA: 1.02 (ref 1.010–1.025)
Urobilinogen, UA: 0.2 E.U./dL
pH, UA: 5.5 (ref 5.0–8.0)

## 2018-11-11 MED ORDER — LISINOPRIL 5 MG PO TABS: 5.0000 mg | ORAL_TABLET | Freq: Every day | ORAL | 6 refills | Status: DC

## 2018-11-11 MED ORDER — FLUTICASONE PROPIONATE 50 MCG/ACT NA SUSP: 2.0000 | Freq: Every day | NASAL | 3 refills | Status: DC

## 2018-11-11 MED FILL — LISINOPRIL 5 MG TAB: 5 | 30 days supply | Qty: 30 | Fill #0

## 2018-11-11 MED FILL — FLUTICASONE PROP 50 MCG SPR: 50 | 30 days supply | Qty: 16 | Fill #0

## 2018-11-11 NOTE — Progress Notes (Signed)
Follow Up  Subjective:    Patient ID: Manuel Brooks, male    DOB: 07/25/1961, 58 y.o.   MRN: [id number]   Chief Complaint  Patient presents with  . Follow-up    1 month HTN    HPI  Manuel Brooks is a 58 year old male with who presents today for follow up of chronic diseases.  Past Medical History:  Diagnosis Date  . Allergy   . GERD (gastroesophageal reflux disease)   . Hypertension   . Leucopenia    Current Status: Since his last office visit, he is doing well with no complaints. He denies visual changes, chest pain, cough, shortness of breath, heart palpitations, and falls. He has occasionally headaches and dizziness with position changes. Denies severe headaches, confusion, seizures, double vision, and blurred vision, nausea and vomiting.  He denies fevers, chills, fatigue, recent infections, weight loss, and night sweats. No reports of GI problems such as nausea, vomiting, diarrhea, and constipation. He has no reports of blood in stools, dysuria and hematuria. No depression or anxiety reported. He denies pain today.  No family history on file. Past Surgical History:  Procedure Laterality Date  . BIOPSY BONE MARROW  09/21/2011      . OTHER SURGICAL HISTORY     was unable to have childern and had surgery so he could have children 1990   Current Meds  Medication Sig  . lisinopril (PRINIVIL,ZESTRIL) 5 MG tablet Take 1 tablet (5 mg total) by mouth daily.  . [DISCONTINUED] lisinopril (PRINIVIL,ZESTRIL) 5 MG tablet Take 1 tablet (5 mg total) by mouth daily.   No Known Allergies  Review of Systems  Constitutional: Negative.   HENT: Negative.   Eyes: Negative.   Respiratory: Negative.   Cardiovascular: Negative.   Gastrointestinal: Negative.   Endocrine: Negative.   Genitourinary: Negative.   Musculoskeletal: Negative.   Skin: Negative.   Allergic/Immunologic: Negative.   Neurological: Positive for dizziness and headaches.  Hematological: Negative.   Psychiatric/Behavioral:  Negative.    Objective:   Physical Exam Vitals signs and nursing note reviewed.  Constitutional:      Appearance: Normal appearance. He is normal weight.  HENT:     Head: Normocephalic and atraumatic.     Right Ear: External ear normal.     Left Ear: External ear normal.     Nose: Nose normal.     Mouth/Throat:     Mouth: Mucous membranes are moist.     Pharynx: Oropharynx is clear.  Eyes:     Conjunctiva/sclera: Conjunctivae normal.  Neck:     Musculoskeletal: Normal range of motion and neck supple.  Cardiovascular:     Rate and Rhythm: Normal rate and regular rhythm.     Pulses: Normal pulses.     Heart sounds: Normal heart sounds.  Pulmonary:     Effort: Pulmonary effort is normal.     Breath sounds: Normal breath sounds.  Abdominal:     General: Bowel sounds are normal.     Palpations: Abdomen is soft.  Musculoskeletal: Normal range of motion.  Skin:    General: Skin is warm and dry.     Capillary Refill: Capillary refill takes less than 2 seconds.  Neurological:     General: No focal deficit present.     Mental Status: He is alert and oriented to person, place, and time.  Psychiatric:        Mood and Affect: Mood normal.        Behavior: Behavior normal.  Thought Content: Thought content normal.        Judgment: Judgment normal.    Assessment & Plan:   1. Essential hypertension - lisinopril (PRINIVIL,ZESTRIL) 5 MG tablet; Take 1 tablet (5 mg total) by mouth daily.  Dispense: 30 tablet; Refill: 6  2. Elevated blood pressure reading Stable today at 144/90. He will continue Lisinopril as prescribed. He will continue to decrease high sodium intake, excessive alcohol intake, increase potassium intake, smoking cessation, and increase physical activity of at least 30 minutes of cardio activity daily. He will continue to follow Heart Healthy or DASH diet.  3. Gastroesophageal reflux disease, esophagitis presence not specified Continue taking Prilosec as  directed.   4. Follow up He will follow up in 6 months.  - POCT urinalysis dipstick  Meds ordered this encounter  Medications  . lisinopril (PRINIVIL,ZESTRIL) 5 MG tablet    Sig: Take 1 tablet (5 mg total) by mouth daily.    Dispense:  30 tablet    Refill:  6  . fluticasone (FLONASE) 50 MCG/ACT nasal spray    Sig: Place 2 sprays into both nostrils daily.    Dispense:  16 g    Refill:  3   Raliegh Ip,  MSN, FNP-C Patient [facility] [address] [place], [place] [address] [phone]

## 2018-12-23 ENCOUNTER — Encounter: Payer: Self-pay | Admitting: Family Medicine

## 2018-12-23 ENCOUNTER — Ambulatory Visit (INDEPENDENT_AMBULATORY_CARE_PROVIDER_SITE_OTHER): Payer: Medicaid Other | Admitting: Family Medicine

## 2018-12-23 VITALS — BP 114/88 | HR 76 | Temp 98.2°F | Ht 63.0 in | Wt 173.0 lb

## 2018-12-23 DIAGNOSIS — R059 Cough, unspecified: Secondary | ICD-10-CM | POA: Insufficient documentation

## 2018-12-23 DIAGNOSIS — H6690 Otitis media, unspecified, unspecified ear: Secondary | ICD-10-CM | POA: Diagnosis not present

## 2018-12-23 DIAGNOSIS — R05 Cough: Secondary | ICD-10-CM | POA: Diagnosis not present

## 2018-12-23 DIAGNOSIS — Z09 Encounter for follow-up examination after completed treatment for conditions other than malignant neoplasm: Secondary | ICD-10-CM

## 2018-12-23 MED ORDER — AMOXICILLIN-POT CLAVULANATE 875-125 MG PO TABS: 1.0000 | ORAL_TABLET | Freq: Two times a day (BID) | ORAL | 0 refills | Status: AC

## 2018-12-23 MED ORDER — GUAIFENESIN 100 MG/5ML PO SOLN: 5.0000 mL | ORAL | 0 refills | Status: DC | PRN

## 2018-12-23 MED FILL — AMOX-CLAV 875-125 MG TABLET: 875-125 | 7 days supply | Qty: 14 | Fill #0

## 2018-12-23 NOTE — Progress Notes (Signed)
[facility]  Sick Visit  Subjective:  Patient ID: Manuel Brooks, male    DOB: 1961/08/21  Age: 58 y.o. MRN: [id number]  CC:  Chief Complaint  Patient presents with  . Cough  . Ear Pain    HPI Manuel Brooks is a 58 year old male who presents for Sick Visit today.   Past Medical History:  Diagnosis Date  . Allergy   . GERD (gastroesophageal reflux disease)   . Hypertension   . Leucopenia    Current Status: Since his last office visit, he has c/o cold symptoms X 3-4 days. He reports cough, ear pain, sneezing, and stuffy nose. He has been taking Nyquil and Motrin to help with symptoms. He states that he is feeling better today, but he continues to have increasing right ear pain.   He denies fevers, chills, fatigue, recent infections, weight loss, and night sweats. He has not had any headaches, visual changes, dizziness, and falls. No chest pain, heart palpitations and shortness of breath reported. No reports of GI problems such as nausea, vomiting, diarrhea, and constipation. He has no reports of blood in stools, dysuria and hematuria. No depression or anxiety reported.   Past Surgical History:  Procedure Laterality Date  . BIOPSY BONE MARROW  09/21/2011      . OTHER SURGICAL HISTORY     was unable to have childern and had surgery so he could have children 1990    History reviewed. No pertinent family history.  Social History   Socioeconomic History  . Marital status: Married    Spouse name: Not on file  . Number of children: Not on file  . Years of education: Not on file  . Highest education level: Not on file  Occupational History  . Not on file  Social Needs  . Financial resource strain: Not on file  . Food insecurity:    Worry: Not on file    Inability: Not on file  . Transportation needs:    Medical: Not on file    Non-medical: Not on file  Tobacco Use  . Smoking status: Former Smoker    Packs/day: 0.50    Types:  Cigarettes    Start date: 10/22/1988    Last attempt to quit: 10/22/1998    Years since quitting: 20.1  . Smokeless tobacco: Never Used  Substance and Sexual Activity  . Alcohol use: No  . Drug use: No    Frequency: 1.0 times per week  . Sexual activity: Not Currently  Lifestyle  . Physical activity:    Days per week: Not on file    Minutes per session: Not on file  . Stress: Not on file  Relationships  . Social connections:    Talks on phone: Not on file    Gets together: Not on file    Attends religious service: Not on file    Active member of club or organization: Not on file    Attends meetings of clubs or organizations: Not on file    Relationship status: Not on file  . Intimate partner violence:    Fear of current or ex partner: Not on file    Emotionally abused: Not on file    Physically abused: Not on file    Forced sexual activity: Not on file  Other Topics Concern  . Not on file  Social History Narrative  . Not on file    Outpatient Medications Prior to Visit  Medication  Sig Dispense Refill  . fluticasone (FLONASE) 50 MCG/ACT nasal spray Place 2 sprays into both nostrils daily. 16 g 3  . lisinopril (PRINIVIL,ZESTRIL) 5 MG tablet Take 1 tablet (5 mg total) by mouth daily. 30 tablet 6  . omeprazole (PRILOSEC) 20 MG capsule Take 1 capsule (20 mg total) by mouth daily. 30 capsule 1  . pramoxine-hydrocortisone (ANALPRAM-HC) 1-1 % rectal cream Place 1 application rectally 2 (two) times daily. 30 g 1  . tamsulosin (FLOMAX) 0.4 MG CAPS capsule Take 1 capsule (0.4 mg total) by mouth daily. 30 capsule 1  . nitroGLYCERIN (NITRODUR - DOSED IN MG/24 HR) 0.2 mg/hr patch Apply 1/4 patch to the affected area every 24 hours (Patient not taking: Reported on 11/11/2018) 30 patch 11  . Tdap (BOOSTRIX) 5-2.5-18.5 LF-MCG/0.5 injection Inject 0.5 mLs into the muscle as directed. (Patient not taking: Reported on 10/10/2018) 0.5 mL 0   No facility-administered medications prior to visit.      No Known Allergies  ROS Review of Systems  Constitutional: Negative.   HENT: Negative.   Eyes: Positive for pain (right).  Respiratory: Positive for cough.        Cold symptoms  Cardiovascular: Negative.   Gastrointestinal: Negative.   Endocrine: Negative.   Genitourinary: Negative.   Musculoskeletal: Negative.   Skin: Negative.   Allergic/Immunologic: Negative.   Neurological: Negative.   Hematological: Negative.   Psychiatric/Behavioral: Negative.       Objective:    Physical Exam  Constitutional: He is oriented to person, place, and time. He appears well-developed and well-nourished.  HENT:  Head: Normocephalic and atraumatic.  Right ear pain, erythema  Eyes: Conjunctivae are normal.  Neck: Normal range of motion. Neck supple.  Cardiovascular: Normal rate, regular rhythm, normal heart sounds and intact distal pulses.  Pulmonary/Chest: Effort normal and breath sounds normal.  Abdominal: Soft. Bowel sounds are normal.  Musculoskeletal: Normal range of motion.  Neurological: He is alert and oriented to person, place, and time. He has normal reflexes.  Skin: Skin is warm and dry.  Psychiatric: He has a normal mood and affect. His behavior is normal. Judgment and thought content normal.  Nursing note and vitals reviewed.   BP 114/88 (BP Location: Left Arm, Patient Position: Sitting, Cuff Size: Large)   Pulse 76   Temp 98.2 F (36.8 C) (Oral)   Ht 5\' 3"  (1.6 m)   Wt 173 lb (78.5 kg)   SpO2 99%   BMI 30.65 kg/m  Wt Readings from Last 3 Encounters:  12/23/18 173 lb (78.5 kg)  11/11/18 175 lb (79.4 kg)  10/10/18 174 lb (78.9 kg)     Health Maintenance Due  Topic Date Due  . COLONOSCOPY  10/22/2008    There are no preventive care reminders to display for this patient.  Lab Results  Component Value Date   TSH 0.956 10/10/2018   Lab Results  Component Value Date   WBC 2.9 (L) 10/10/2018   HGB 15.3 10/10/2018   HCT 44.8 10/10/2018   MCV 80  10/10/2018   PLT 277 10/10/2018   Lab Results  Component Value Date   NA 143 10/10/2018   K 4.1 10/10/2018   CO2 26 10/10/2018   GLUCOSE 102 (H) 10/10/2018   BUN 16 10/10/2018   CREATININE 1.01 10/10/2018   BILITOT 0.4 10/10/2018   ALKPHOS 73 10/10/2018   AST 21 10/10/2018   ALT 17 10/10/2018   PROT 7.1 10/10/2018   ALBUMIN 4.4 10/10/2018   CALCIUM 9.6 10/10/2018  Lab Results  Component Value Date   CHOL 192 05/26/2018   Lab Results  Component Value Date   HDL 48 05/26/2018   Lab Results  Component Value Date   LDLCALC 129 (H) 05/26/2018   Lab Results  Component Value Date   TRIG 74 05/26/2018   Lab Results  Component Value Date   CHOLHDL 4.0 05/26/2018   Lab Results  Component Value Date   HGBA1C 5.0 10/10/2018   Assessment & Plan:   1. Acute otitis media, unspecified otitis media type We will initiate Augmentin today.  - amoxicillin-clavulanate (AUGMENTIN) 875-125 MG tablet; Take 1 tablet by mouth 2 (two) times daily for 7 days.  Dispense: 14 tablet; Refill: 0  2. Cough We will encourage him to use Robitussin and OTC Cold/Flu medications as needed for cough.  - guaiFENesin (ROBITUSSIN) 100 MG/5ML SOLN; Take 5 mLs (100 mg total) by mouth every 4 (four) hours as needed for cough or to loosen phlegm.  Dispense: 236 mL; Refill: 0  3. Follow up He will keep previously schedule follow up.   Meds ordered this encounter  Medications  . amoxicillin-clavulanate (AUGMENTIN) 875-125 MG tablet    Sig: Take 1 tablet by mouth 2 (two) times daily for 7 days.    Dispense:  14 tablet    Refill:  0  . guaiFENesin (ROBITUSSIN) 100 MG/5ML SOLN    Sig: Take 5 mLs (100 mg total) by mouth every 4 (four) hours as needed for cough or to loosen phlegm.    Dispense:  236 mL    Refill:  0    No orders of the defined types were placed in this encounter.   Referral Orders  No referral(s) requested today    Raliegh Ip,  MSN, FNP-C [facility] [address] [place], [place] [address] [phone]   Problem List Items Addressed This Visit    None    Visit Diagnoses    Acute otitis media, unspecified otitis media type    -  Primary   Relevant Medications   amoxicillin-clavulanate (AUGMENTIN) 875-125 MG tablet   Cough       Relevant Medications   guaiFENesin (ROBITUSSIN) 100 MG/5ML SOLN   Follow up          Meds ordered this encounter  Medications  . amoxicillin-clavulanate (AUGMENTIN) 875-125 MG tablet    Sig: Take 1 tablet by mouth 2 (two) times daily for 7 days.    Dispense:  14 tablet    Refill:  0  . guaiFENesin (ROBITUSSIN) 100 MG/5ML SOLN    Sig: Take 5 mLs (100 mg total) by mouth every 4 (four) hours as needed for cough or to loosen phlegm.    Dispense:  236 mL    Refill:  0    Follow-up: No follow-ups on file.    Kallie Locks, FNP

## 2018-12-23 NOTE — Patient Instructions (Signed)
Viral Respiratory Infection A viral respiratory infection is an illness that affects parts of the body that are used for breathing. These include the lungs, nose, and throat. It is caused by a germ called a virus. Some examples of this kind of infection are:  A cold.  The flu (influenza).  A respiratory syncytial virus (RSV) infection. A person who gets this illness may have the following symptoms:  A stuffy or runny nose.  Yellow or green fluid in the nose.  A cough.  Sneezing.  Tiredness (fatigue).  Achy muscles.  A sore throat.  Sweating or chills.  A fever.  A headache. Follow these instructions at home: Managing pain and congestion  Take over-the-counter and prescription medicines only as told by your doctor.  If you have a sore throat, gargle with salt water. Do this 3-4 times per day or as needed. To make a salt-water mixture, dissolve -1 tsp of salt in 1 cup of warm water. Make sure that all the salt dissolves.  Use nose drops made from salt water. This helps with stuffiness (congestion). It also helps soften the skin around your nose.  Drink enough fluid to keep your pee (urine) pale yellow. General instructions   Rest as much as possible.  Do not drink alcohol.  Do not use any products that have nicotine or tobacco, such as cigarettes and e-cigarettes. If you need help quitting, ask your doctor.  Keep all follow-up visits as told by your doctor. This is important. How is this prevented?   Get a flu shot every year. Ask your doctor when you should get your flu shot.  Do not let other people get your germs. If you are sick: ? Stay home from work or school. ? Wash your hands with soap and water often. Wash your hands after you cough or sneeze. If soap and water are not available, use hand sanitizer.  Avoid contact with people who are sick during cold and flu season. This is in fall and winter. Get help if:  Your symptoms last for 10 days or  longer.  Your symptoms get worse over time.  You have a fever.  You have very bad pain in your face or forehead.  Parts of your jaw or neck become very swollen. Get help right away if:  You feel pain or pressure in your chest.  You have shortness of breath.  You faint or feel like you will faint.  You keep throwing up (vomiting).  You feel confused. Summary  A viral respiratory infection is an illness that affects parts of the body that are used for breathing.  Examples of this illness include a cold, the flu, and respiratory syncytial virus (RSV) infection.  The infection can cause a runny nose, cough, sneezing, sore throat, and fever.  Follow what your doctor tells you about taking medicines, drinking lots of fluid, washing your hands, resting at home, and avoiding people who are sick. This information is not intended to replace advice given to you by your health care provider. Make sure you discuss any questions you have with your health care provider. Document Released: 09/20/2008 Document Revised: 11/18/2017 Document Reviewed: 11/18/2017 Elsevier Interactive Patient Education  2019 Elsevier Inc. Guaifenesin oral solution and syrup What is this medicine? GUAIFENESIN (gwye FEN e sin) is an expectorant. It helps to thin mucous and make coughs more productive. This medicine is used to treat coughs caused by colds or the flu. It is not intended to treat chronic cough  caused by smoking, asthma, emphysema, or heart failure. This medicine may be used for other purposes; ask your health care provider or pharmacist if you have questions. COMMON BRAND NAME(S): Altarussin, Altorant, Cough, Diabetic Tussin EX, Diabetic Tussin Mucus Relief, ElixSure EX, Ganidin NR, GERI-TUSSIN, Guiatuss, Iophen-NR, Miltuss EX, Mucinex Children's, Mucus + Chest Congestion, Mucus Relief Children's, Naldecon, Organidin NR, Q-Tussin, Robafen, Robafen Congestion, Robitussin, Robitussin Mucus + Chest  Congestion, Scot-Tussin Expectorant, Siltussin DAS, Siltussin Diabetic DAS-Na, Siltussin SA What should I tell my health care provider before I take this medicine? They need to know if you have any of these conditions: -diabetes -fever -kidney disease -an unusual or allergic reaction to guaifenesin, other medicines, foods, dyes, or preservatives -pregnant or trying to get pregnant -breast-feeding How should I use this medicine? Take this medicine by mouth. Follow the directions on the prescription label. Use a specially marked spoon or container to measure your dose. Household spoons are not accurate. Take your medicine at regular intervals. Do not take it more often than directed. Talk to your pediatrician regarding the use of this medicine in children. Special care may be needed. Overdosage: If you think you have taken too much of this medicine contact a poison control center or emergency room at once. NOTE: This medicine is only for you. Do not share this medicine with others. What if I miss a dose? If you miss a dose, take it as soon as you can. If it is almost time for your next dose, take only that dose. Do not take double or extra doses. What may interact with this medicine? Interactions are not expected. This list may not describe all possible interactions. Give your health care provider a list of all the medicines, herbs, non-prescription drugs, or dietary supplements you use. Also tell them if you smoke, drink alcohol, or use illegal drugs. Some items may interact with your medicine. What should I watch for while using this medicine? Do not treat a cough for more than 1 week without consulting your doctor or health care professional. If you also have a high fever, skin rash, continuing headache, or sore throat, see your doctor. For best results, drink 6 to 8 glasses water daily while you are taking this medicine. What side effects may I notice from receiving this medicine? Side  effects that you should report to your doctor or health care professional as soon as possible: -allergic reactions like skin rash, itching or hives, swelling of the face, lips, or tongue Side effects that usually do not require medical attention (report to your doctor or health care professional if they continue or are bothersome): -dizziness -headache -stomach upset This list may not describe all possible side effects. Call your doctor for medical advice about side effects. You may report side effects to FDA at 1-800-FDA-1088. Where should I keep my medicine? Keep out of the reach of children. Store at room temperature between 20 and 25 degrees C (68 and 77 degrees F). Do not freeze. Keep container tightly closed. Throw away any unused medicine after the expiration date. NOTE: This sheet is a summary. It may not cover all possible information. If you have questions about this medicine, talk to your doctor, pharmacist, or health care provider.  2019 Elsevier/Gold Standard (2008-02-18 11:48:29) Cough, Adult  A cough helps to clear your throat and lungs. A cough may last only 2-3 weeks (acute), or it may last longer than 8 weeks (chronic). Many different things can cause a cough. A cough may be  a sign of an illness or another medical condition. Follow these instructions at home:  Pay attention to any changes in your cough.  Take medicines only as told by your doctor. ? If you were prescribed an antibiotic medicine, take it as told by your doctor. Do not stop taking it even if you start to feel better. ? Talk with your doctor before you try using a cough medicine.  Drink enough fluid to keep your pee (urine) clear or pale yellow.  If the air is dry, use a cold steam vaporizer or humidifier in your home.  Stay away from things that make you cough at work or at home.  If your cough is worse at night, try using extra pillows to raise your head up higher while you sleep.  Do not smoke, and  try not to be around smoke. If you need help quitting, ask your doctor.  Do not have caffeine.  Do not drink alcohol.  Rest as needed. Contact a doctor if:  You have new problems (symptoms).  You cough up yellow fluid (pus).  Your cough does not get better after 2-3 weeks, or your cough gets worse.  Medicine does not help your cough and you are not sleeping well.  You have pain that gets worse or pain that is not helped with medicine.  You have a fever.  You are losing weight and you do not know why.  You have night sweats. Get help right away if:  You cough up blood.  You have trouble breathing.  Your heartbeat is very fast. This information is not intended to replace advice given to you by your health care provider. Make sure you discuss any questions you have with your health care provider. Document Released: 06/21/2011 Document Revised: 03/15/2016 Document Reviewed: 12/15/2014 Elsevier Interactive Patient Education  2019 Elsevier Inc. Amoxicillin; Clavulanic Acid tablets What is this medicine? AMOXICILLIN; CLAVULANIC ACID (a mox i SIL in; KLAV yoo lan ic AS id) is a penicillin antibiotic. It is used to treat certain kinds of bacterial infections. It will not work for colds, flu, or other viral infections. This medicine may be used for other purposes; ask your health care provider or pharmacist if you have questions. COMMON BRAND NAME(S): Augmentin What should I tell my health care provider before I take this medicine? They need to know if you have any of these conditions: -bowel disease, like colitis -kidney disease -liver disease -mononucleosis -an unusual or allergic reaction to amoxicillin, penicillin, cephalosporin, other antibiotics, clavulanic acid, other medicines, foods, dyes, or preservatives -pregnant or trying to get pregnant -breast-feeding How should I use this medicine? Take this medicine by mouth with a full glass of water. Follow the directions on  the prescription label. Take at the start of a meal. Do not crush or chew. If the tablet has a score line, you may cut it in half at the score line for easier swallowing. Take your medicine at regular intervals. Do not take your medicine more often than directed. Take all of your medicine as directed even if you think you are better. Do not skip doses or stop your medicine early. Talk to your pediatrician regarding the use of this medicine in children. Special care may be needed. Overdosage: If you think you have taken too much of this medicine contact a poison control center or emergency room at once. NOTE: This medicine is only for you. Do not share this medicine with others. What if I miss a dose?  If you miss a dose, take it as soon as you can. If it is almost time for your next dose, take only that dose. Do not take double or extra doses. What may interact with this medicine? -allopurinol -anticoagulants -birth control pills -methotrexate -probenecid This list may not describe all possible interactions. Give your health care provider a list of all the medicines, herbs, non-prescription drugs, or dietary supplements you use. Also tell them if you smoke, drink alcohol, or use illegal drugs. Some items may interact with your medicine. What should I watch for while using this medicine? Tell your doctor or health care professional if your symptoms do not improve. Do not treat diarrhea with over the counter products. Contact your doctor if you have diarrhea that lasts more than 2 days or if it is severe and watery. If you have diabetes, you may get a false-positive result for sugar in your urine. Check with your doctor or health care professional. Birth control pills may not work properly while you are taking this medicine. Talk to your doctor about using an extra method of birth control. What side effects may I notice from receiving this medicine? Side effects that you should report to your doctor  or health care professional as soon as possible: -allergic reactions like skin rash, itching or hives, swelling of the face, lips, or tongue -breathing problems -dark urine -fever or chills, sore throat -redness, blistering, peeling or loosening of the skin, including inside the mouth -seizures -trouble passing urine or change in the amount of urine -unusual bleeding, bruising -unusually weak or tired -white patches or sores in the mouth or throat Side effects that usually do not require medical attention (report to your doctor or health care professional if they continue or are bothersome): -diarrhea -dizziness -headache -nausea, vomiting -stomach upset -vaginal or anal irritation This list may not describe all possible side effects. Call your doctor for medical advice about side effects. You may report side effects to FDA at 1-800-FDA-1088. Where should I keep my medicine? Keep out of the reach of children. Store at room temperature below 25 degrees C (77 degrees F). Keep container tightly closed. Throw away any unused medicine after the expiration date. NOTE: This sheet is a summary. It may not cover all possible information. If you have questions about this medicine, talk to your doctor, pharmacist, or health care provider.  2019 Elsevier/Gold Standard (2008-01-01 12:04:30) Otitis Media, Adult  Otitis media means that the middle ear is red and swollen (inflamed) and full of fluid. The condition usually goes away on its own. Follow these instructions at home:  Take over-the-counter and prescription medicines only as told by your doctor.  If you were prescribed an antibiotic medicine, take it as told by your doctor. Do not stop taking the antibiotic even if you start to feel better.  Keep all follow-up visits as told by your doctor. This is important. Contact a doctor if:  You have bleeding from your nose.  There is a lump on your neck.  You are not getting better in 5  days.  You feel worse instead of better. Get help right away if:  You have pain that is not helped with medicine.  You have swelling, redness, or pain around your ear.  You get a stiff neck.  You cannot move part of your face (paralyzed).  You notice that the bone behind your ear hurts when you touch it.  You get a very bad headache. Summary  Otitis  media means that the middle ear is red, swollen, and full of fluid.  This condition usually goes away on its own. In some cases, treatment may be needed.  If you were prescribed an antibiotic medicine, take it as told by your doctor. This information is not intended to replace advice given to you by your health care provider. Make sure you discuss any questions you have with your health care provider. Document Released: 03/26/2008 Document Revised: 10/29/2016 Document Reviewed: 10/29/2016 Elsevier Interactive Patient Education  2019 [employer].

## 2019-01-02 ENCOUNTER — Ambulatory Visit: Payer: Medicaid Other | Admitting: Family Medicine

## 2019-01-03 ENCOUNTER — Encounter (HOSPITAL_COMMUNITY): Payer: Self-pay | Admitting: Emergency Medicine

## 2019-01-03 ENCOUNTER — Emergency Department (HOSPITAL_COMMUNITY): Payer: Medicaid Other

## 2019-01-03 ENCOUNTER — Emergency Department (HOSPITAL_COMMUNITY)
Admission: EM | Admit: 2019-01-03 | Discharge: 2019-01-03 | Disposition: A | Discharge status: Home / Self Care | Payer: Medicaid Other | Attending: Emergency Medicine | Admitting: Emergency Medicine

## 2019-01-03 ENCOUNTER — Other Ambulatory Visit: Payer: Self-pay

## 2019-01-03 DIAGNOSIS — Z209 Contact with and (suspected) exposure to unspecified communicable disease: Secondary | ICD-10-CM | POA: Diagnosis not present

## 2019-01-03 DIAGNOSIS — J181 Lobar pneumonia, unspecified organism: Secondary | ICD-10-CM

## 2019-01-03 DIAGNOSIS — I10 Essential (primary) hypertension: Secondary | ICD-10-CM | POA: Insufficient documentation

## 2019-01-03 DIAGNOSIS — R05 Cough: Secondary | ICD-10-CM | POA: Diagnosis not present

## 2019-01-03 DIAGNOSIS — Z79899 Other long term (current) drug therapy: Secondary | ICD-10-CM | POA: Insufficient documentation

## 2019-01-03 DIAGNOSIS — Z87891 Personal history of nicotine dependence: Secondary | ICD-10-CM | POA: Insufficient documentation

## 2019-01-03 DIAGNOSIS — J189 Pneumonia, unspecified organism: Secondary | ICD-10-CM | POA: Diagnosis not present

## 2019-01-03 DIAGNOSIS — R110 Nausea: Secondary | ICD-10-CM | POA: Diagnosis not present

## 2019-01-03 MED ORDER — DOXYCYCLINE HYCLATE 100 MG PO TABS: 100.0000 mg | ORAL_TABLET | Freq: Two times a day (BID) | ORAL | 0 refills | Status: AC

## 2019-01-03 MED ORDER — DOXYCYCLINE HYCLATE 100 MG PO TABS
100.0000 mg | ORAL_TABLET | Freq: Once | ORAL | Status: AC
  Administered 2019-01-03: 100 mg via ORAL
  Filled 2019-01-03: qty 1

## 2019-01-03 MED ORDER — ALBUTEROL SULFATE HFA 108 (90 BASE) MCG/ACT IN AERS
2.0000 | INHALATION_SPRAY | Freq: Once | RESPIRATORY_TRACT | Status: AC
  Administered 2019-01-03: 2 via RESPIRATORY_TRACT
  Filled 2019-01-03: qty 6.7

## 2019-01-03 MED ORDER — LEVOFLOXACIN 750 MG PO TABS: 750.0000 mg | ORAL_TABLET | Freq: Once | ORAL | Status: DC

## 2019-01-03 NOTE — ED Notes (Signed)
EMERGENCY CONTACT: Hazael Ramrattan, relation to pt is son phone number [phone]

## 2019-01-03 NOTE — ED Notes (Signed)
EMERGENCY CONTACT: Augustino Leard, relation to pt is father phone number [phone]

## 2019-01-03 NOTE — Discharge Instructions (Addendum)
Dear Manuel Brooks  You came to [place] with cough. We have determined this was caused by possible asthma exacerbation. Here are our recommendations for you at discharge:  Take your albuterol inhaler as needed and avoid triggers that cause your coughing fits.  Take doxycycline 100mg  twice daily for 7 days.  Thank you for choosing [facility].

## 2019-01-03 NOTE — ED Notes (Signed)
Patient transported to X-ray 

## 2019-01-03 NOTE — ED Provider Notes (Signed)
[facility] EMERGENCY DEPARTMENT Provider Note   CSN: [id number] Arrival date & time: 01/03/19  2052    History   Chief Complaint Chief Complaint  Patient presents with  . Cough    HPI Manuel Brooks is a 58 y.o. male w/ PMH of GERD and HTN presenting with complaint of chronic cough. He states he was in his usual state of health until a month ago when he had a car ride with his friend where he was coughed on. He states since then he has had frequent dry hacking coughs. He states he had prior episode similar to this in the past where he was treated with albuterol with symptomatic improvement. He mentions that he saw his primary care provider for this issue recently who treated it as presumed viral URI and recommended OTC treatment. 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 He mentions no fevers, chills, nausea, vomiting, diarrhea. Denies any significant travel history.  Past Medical History:  Diagnosis Date  . Allergy   . GERD (gastroesophageal reflux disease)   . Hypertension   . Leucopenia     Patient Active Problem List   Diagnosis Date Noted  . Acute otitis media 12/23/2018  . Cough 12/23/2018  . Hypertension 05/20/2018  . Right shoulder pain 09/03/2014  . Traumatic tear of supraspinatus tendon of left shoulder 07/30/2014  . Pain, dental 08/06/2013  . Penile bleeding 08/06/2013  . Leukocytopenia, unspecified 09/28/2011    Past Surgical History:  Procedure Laterality Date  . BIOPSY BONE MARROW  09/21/2011      . OTHER SURGICAL HISTORY     was unable to have childern and had surgery so he could have children 1990     Home Medications    Prior to Admission medications   Medication Sig Start Date End Date Taking? Authorizing Provider  fluticasone (FLONASE) 50 MCG/ACT nasal spray Place 2 sprays into both nostrils daily. 11/11/18   Kallie Locks, FNP  guaiFENesin (ROBITUSSIN) 100 MG/5ML SOLN Take 5 mLs (100 mg total) by mouth every 4 (four) hours as needed for cough or to loosen phlegm. 12/23/18   Kallie Locks, FNP  lisinopril (PRINIVIL,ZESTRIL) 5 MG tablet Take 1 tablet (5 mg total) by mouth daily. 11/11/18   Kallie Locks, FNP  nitroGLYCERIN (NITRODUR - DOSED IN MG/24 HR) 0.2 mg/hr patch Apply 1/4 patch to the affected area every 24 hours Patient not taking: Reported on 11/11/2018 10/10/18   Kallie Locks, FNP  omeprazole (PRILOSEC) 20 MG capsule Take 1 capsule (20 mg total) by mouth daily. 10/10/18   Kallie Locks, FNP  pramoxine-hydrocortisone [facility]) 1-1 % rectal cream Place 1 application rectally 2 (two) times daily. 10/10/18   Kallie Locks, FNP  tamsulosin (FLOMAX) 0.4 MG CAPS capsule Take 1 capsule (0.4 mg total) by mouth daily. 10/10/18   Kallie Locks, FNP  Tdap Leda Min) 5-2.5-18.5 LF-MCG/0.5 injection Inject 0.5 mLs into the muscle as directed. Patient not taking: Reported on 10/10/2018 05/21/18   Hoy Register, MD   Family History No family history on file.  Social History Social History   Tobacco Use  . Smoking status: Former Smoker    Packs/day: 0.50    Types: Cigarettes    Start date: 10/22/1988    Last attempt to quit: 10/22/1998    Years since quitting: 20.2  .  Smokeless tobacco: Never Used  Substance Use Topics  . Alcohol use: No  . Drug use: No    Frequency: 1.0 times per week     Allergies   Patient has no known allergies.   Review of Systems Review of Systems  Constitutional: Negative for chills, fatigue and fever.  HENT: Negative for rhinorrhea and sore throat.   Respiratory: Positive for cough. Negative for chest tightness, shortness of breath and wheezing.   Cardiovascular: Negative for chest pain and palpitations.  Gastrointestinal: Negative for constipation, diarrhea, nausea and vomiting.  Neurological: Negative for dizziness, weakness, numbness and  headaches.  All other systems reviewed and are negative.  Physical Exam Updated Vital Signs BP (!) 155/106   Pulse 97   Temp 99.2 F (37.3 C) (Oral)   Resp 16   SpO2 98%   Physical Exam Constitutional:      General: He is not in acute distress.    Appearance: He is normal weight.  HENT:     Nose: Nose normal. No congestion or rhinorrhea.     Mouth/Throat:     Mouth: Mucous membranes are moist.     Pharynx: Oropharynx is clear. No oropharyngeal exudate.  Eyes:     Conjunctiva/sclera: Conjunctivae normal.  Neck:     Musculoskeletal: Normal range of motion and neck supple.  Cardiovascular:     Rate and Rhythm: Normal rate and regular rhythm.     Pulses: Normal pulses.     Heart sounds: Normal heart sounds. No murmur.  Pulmonary:     Effort: Pulmonary effort is normal.     Breath sounds: Normal breath sounds. No wheezing or rales.     Comments: Observed frequent cough fits during exam Abdominal:     General: Abdomen is flat. Bowel sounds are normal.     Tenderness: There is no abdominal tenderness.  Musculoskeletal: Normal range of motion.     Right lower leg: No edema.     Left lower leg: No edema.  Lymphadenopathy:     Cervical: No cervical adenopathy.  Skin:    General: Skin is warm and dry.  Neurological:     Mental Status: He is alert.     ED Treatments / Results  Labs (all labs ordered are listed, but only abnormal results are displayed) Labs Reviewed - No data to display  EKG None  Radiology Dg Chest 2 View  Result Date: 01/03/2019 CLINICAL DATA:  Cough EXAM: CHEST - 2 VIEW COMPARISON:  09/24/2007 FINDINGS: Streaky left basilar opacity. No pleural effusion. Borderline cardiomegaly. No pneumothorax. IMPRESSION: Streaky left basilar opacity may reflect atelectasis or minimal infiltrate. Borderline to mild cardiomegaly Electronically Signed   By: Jasmine Pang M.D.   On: 01/03/2019 22:19    Procedures Procedures (including critical care time)   Medications Ordered in ED Medications  albuterol (PROVENTIL HFA;VENTOLIN HFA) 108 (90 Base) MCG/ACT inhaler 2 puff (has no administration in time range)  levofloxacin (LEVAQUIN) tablet 750 mg (has no administration in time range)     Initial Impression / Assessment and Plan / ED Course  I have reviewed the triage vital signs and the nursing notes.  Pertinent labs & imaging results that were available during my care of the patient were reviewed by me and considered in my medical decision making (see chart for details).  Mr.Nikolaj is 58 Yo M w/ PMh of GERD and HTN presenting with exacerbation of chronic cough after smoke exposure. 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 Will  treat with albuterol and get chest X-ray to rule out pneumonia.  Final Clinical Impressions(s) / ED Diagnoses   Final diagnoses:  Community acquired pneumonia of left lower lobe of lung (HCC)   Chest X-ray shows left basilar opacity. Will treat as community acquired pneumonia and discharge home with 7 days of doxycycline.  ED Discharge Orders    None       Theotis Barrio, MD 01/03/19 2236    Charlynne Pander, MD 01/03/19 [phone]

## 2019-01-03 NOTE — ED Triage Notes (Signed)
Pt BIB EMS from home with cough/cold for 1 month. Seen by PCP, otc meds given. Hx of asthma. While working today he felt a little short of breath.

## 2019-01-03 NOTE — ED Notes (Signed)
Patient verbalizes understanding of discharge instructions. Opportunity for questioning and answers were provided. Armband removed by staff, pt discharged from ED.  

## 2019-01-03 NOTE — ED Notes (Signed)
EMERGENCY CONTACT: Aowd Scot, relation to pt is son phone number [phone] 

## 2019-01-08 MED FILL — LISINOPRIL 5 MG TAB: 5 | 30 days supply | Qty: 30 | Fill #1

## 2019-01-21 ENCOUNTER — Other Ambulatory Visit: Payer: Self-pay

## 2019-01-21 ENCOUNTER — Encounter: Payer: Self-pay | Admitting: Family Medicine

## 2019-01-21 ENCOUNTER — Ambulatory Visit (INDEPENDENT_AMBULATORY_CARE_PROVIDER_SITE_OTHER): Payer: Medicaid Other | Admitting: Family Medicine

## 2019-01-21 VITALS — BP 136/88 | HR 78 | Temp 98.1°F | Ht 63.0 in | Wt 174.0 lb

## 2019-01-21 DIAGNOSIS — Z8701 Personal history of pneumonia (recurrent): Secondary | ICD-10-CM | POA: Insufficient documentation

## 2019-01-21 DIAGNOSIS — R030 Elevated blood-pressure reading, without diagnosis of hypertension: Secondary | ICD-10-CM

## 2019-01-21 DIAGNOSIS — Z09 Encounter for follow-up examination after completed treatment for conditions other than malignant neoplasm: Secondary | ICD-10-CM

## 2019-01-21 DIAGNOSIS — R399 Unspecified symptoms and signs involving the genitourinary system: Secondary | ICD-10-CM | POA: Diagnosis not present

## 2019-01-21 DIAGNOSIS — R05 Cough: Secondary | ICD-10-CM

## 2019-01-21 DIAGNOSIS — H6690 Otitis media, unspecified, unspecified ear: Secondary | ICD-10-CM | POA: Diagnosis not present

## 2019-01-21 DIAGNOSIS — J189 Pneumonia, unspecified organism: Secondary | ICD-10-CM

## 2019-01-21 DIAGNOSIS — K219 Gastro-esophageal reflux disease without esophagitis: Secondary | ICD-10-CM | POA: Insufficient documentation

## 2019-01-21 DIAGNOSIS — R059 Cough, unspecified: Secondary | ICD-10-CM

## 2019-01-21 HISTORY — DX: Pneumonia, unspecified organism: J18.9

## 2019-01-21 MED ORDER — TAMSULOSIN HCL 0.4 MG PO CAPS: 0.4000 mg | ORAL_CAPSULE | Freq: Every day | ORAL | 3 refills | Status: DC

## 2019-01-21 MED ORDER — FLUTICASONE PROPIONATE 50 MCG/ACT NA SUSP: 2.0000 | Freq: Every day | NASAL | 3 refills | Status: DC

## 2019-01-21 MED ORDER — OMEPRAZOLE 20 MG PO CPDR: 20.0000 mg | DELAYED_RELEASE_CAPSULE | Freq: Every day | ORAL | 3 refills | Status: DC

## 2019-01-21 MED ORDER — AMOXICILLIN-POT CLAVULANATE 875-125 MG PO TABS: 1.0000 | ORAL_TABLET | Freq: Two times a day (BID) | ORAL | 0 refills | Status: AC

## 2019-01-21 NOTE — Patient Instructions (Signed)
Amoxicillin; Clavulanic Acid tablets What is this medicine? AMOXICILLIN; CLAVULANIC ACID (a mox i SIL in; KLAV yoo lan ic AS id) is a penicillin antibiotic. It is used to treat certain kinds of bacterial infections. It will not work for colds, flu, or other viral infections. This medicine may be used for other purposes; ask your health care provider or pharmacist if you have questions. COMMON BRAND NAME(S): Augmentin What should I tell my health care provider before I take this medicine? They need to know if you have any of these conditions: -bowel disease, like colitis -kidney disease -liver disease -mononucleosis -an unusual or allergic reaction to amoxicillin, penicillin, cephalosporin, other antibiotics, clavulanic acid, other medicines, foods, dyes, or preservatives -pregnant or trying to get pregnant -breast-feeding How should I use this medicine? Take this medicine by mouth with a full glass of water. Follow the directions on the prescription label. Take at the start of a meal. Do not crush or chew. If the tablet has a score line, you may cut it in half at the score line for easier swallowing. Take your medicine at regular intervals. Do not take your medicine more often than directed. Take all of your medicine as directed even if you think you are better. Do not skip doses or stop your medicine early. Talk to your pediatrician regarding the use of this medicine in children. Special care may be needed. Overdosage: If you think you have taken too much of this medicine contact a poison control center or emergency room at once. NOTE: This medicine is only for you. Do not share this medicine with others. What if I miss a dose? If you miss a dose, take it as soon as you can. If it is almost time for your next dose, take only that dose. Do not take double or extra doses. What may interact with this medicine? -allopurinol -anticoagulants -birth control pills -methotrexate -probenecid This  list may not describe all possible interactions. Give your health care provider a list of all the medicines, herbs, non-prescription drugs, or dietary supplements you use. Also tell them if you smoke, drink alcohol, or use illegal drugs. Some items may interact with your medicine. What should I watch for while using this medicine? Tell your doctor or health care professional if your symptoms do not improve. Do not treat diarrhea with over the counter products. Contact your doctor if you have diarrhea that lasts more than 2 days or if it is severe and watery. If you have diabetes, you may get a false-positive result for sugar in your urine. Check with your doctor or health care professional. Birth control pills may not work properly while you are taking this medicine. Talk to your doctor about using an extra method of birth control. What side effects may I notice from receiving this medicine? Side effects that you should report to your doctor or health care professional as soon as possible: -allergic reactions like skin rash, itching or hives, swelling of the face, lips, or tongue -breathing problems -dark urine -fever or chills, sore throat -redness, blistering, peeling or loosening of the skin, including inside the mouth -seizures -trouble passing urine or change in the amount of urine -unusual bleeding, bruising -unusually weak or tired -white patches or sores in the mouth or throat Side effects that usually do not require medical attention (report to your doctor or health care professional if they continue or are bothersome): -diarrhea -dizziness -headache -nausea, vomiting -stomach upset -vaginal or anal irritation This list may   not describe all possible side effects. Call your doctor for medical advice about side effects. You may report side effects to FDA at 1-800-FDA-1088. Where should I keep my medicine? Keep out of the reach of children. Store at room temperature below 25 degrees  C (77 degrees F). Keep container tightly closed. Throw away any unused medicine after the expiration date. NOTE: This sheet is a summary. It may not cover all possible information. If you have questions about this medicine, talk to your doctor, pharmacist, or health care provider.  2019 Elsevier/Gold Standard (2008-01-01 12:04:30) Otitis Media, Adult  Otitis media means that the middle ear is red and swollen (inflamed) and full of fluid. The condition usually goes away on its own. Follow these instructions at home:  Take over-the-counter and prescription medicines only as told by your doctor.  If you were prescribed an antibiotic medicine, take it as told by your doctor. Do not stop taking the antibiotic even if you start to feel better.  Keep all follow-up visits as told by your doctor. This is important. Contact a doctor if:  You have bleeding from your nose.  There is a lump on your neck.  You are not getting better in 5 days.  You feel worse instead of better. Get help right away if:  You have pain that is not helped with medicine.  You have swelling, redness, or pain around your ear.  You get a stiff neck.  You cannot move part of your face (paralyzed).  You notice that the bone behind your ear hurts when you touch it.  You get a very bad headache. Summary  Otitis media means that the middle ear is red, swollen, and full of fluid.  This condition usually goes away on its own. In some cases, treatment may be needed.  If you were prescribed an antibiotic medicine, take it as told by your doctor. This information is not intended to replace advice given to you by your health care provider. Make sure you discuss any questions you have with your health care provider. Document Released: 03/26/2008 Document Revised: 10/29/2016 Document Reviewed: 10/29/2016 Elsevier Interactive Patient Education  2019 Elsevier Inc.  

## 2019-01-21 NOTE — Progress Notes (Signed)
[facility]  Hospital Follow Up  Subjective:  Patient ID: Artorius Egeland, male    DOB: Nov 26, 1960  Age: 58 y.o. MRN: [id number]  CC:  Chief Complaint  Patient presents with  . Hospitalization Follow-up  . Pneumonia  . Ear Pain    not hurting just keeps opening and closing     HPI Jalan Kina is a 58 year old male who presents for Hospital Up today.  Past Medical History:  Diagnosis Date  . Allergy   . GERD (gastroesophageal reflux disease)   . Hypertension   . Leucopenia    Current Status: Since his last office visit, he has had an ED visit for Community Acquired Pneumonia on 01/03/2019. He is doing well with no complaints. His cough has subsided. He continues to have right ear pain/tenderness/discomfort since. He denies fevers, chills, fatigue, weight loss, and night sweats. He has not had any headaches, visual changes, dizziness, and falls. No chest pain, heart palpitations, cough and shortness of breath reported. No reports of GI problems such as nausea, vomiting, diarrhea, and constipation. He has no reports of blood in stools, dysuria and hematuria. No depression or anxiety reported.   Past Surgical History:  Procedure Laterality Date  . BIOPSY BONE MARROW  09/21/2011      . OTHER SURGICAL HISTORY     was unable to have childern and had surgery so he could have children 1990    No family history on file.  Social History   Socioeconomic History  . Marital status: Married    Spouse name: Not on file  . Number of children: Not on file  . Years of education: Not on file  . Highest education level: Not on file  Occupational History  . Not on file  Social Needs  . Financial resource strain: Not on file  . Food insecurity:    Worry: Not on file    Inability: Not on file  . Transportation needs:    Medical: Not on file    Non-medical: Not on file  Tobacco Use  . Smoking status: Former Smoker    Packs/day: 0.50    Types:  Cigarettes    Start date: 10/22/1988    Last attempt to quit: 10/22/1998    Years since quitting: 20.2  . Smokeless tobacco: Never Used  Substance and Sexual Activity  . Alcohol use: No  . Drug use: No    Frequency: 1.0 times per week  . Sexual activity: Not Currently  Lifestyle  . Physical activity:    Days per week: Not on file    Minutes per session: Not on file  . Stress: Not on file  Relationships  . Social connections:    Talks on phone: Not on file    Gets together: Not on file    Attends religious service: Not on file    Active member of club or organization: Not on file    Attends meetings of clubs or organizations: Not on file    Relationship status: Not on file  . Intimate partner violence:    Fear of current or ex partner: Not on file    Emotionally abused: Not on file    Physically abused: Not on file    Forced sexual activity: Not on file  Other Topics Concern  . Not on file  Social History Narrative  . Not on file    Outpatient Medications Prior to Visit  Medication Sig Dispense Refill  .  lisinopril (PRINIVIL,ZESTRIL) 5 MG tablet Take 1 tablet (5 mg total) by mouth daily. 30 tablet 6  . pramoxine-hydrocortisone (ANALPRAM-HC) 1-1 % rectal cream Place 1 application rectally 2 (two) times daily. 30 g 1  . fluticasone (FLONASE) 50 MCG/ACT nasal spray Place 2 sprays into both nostrils daily. 16 g 3  . omeprazole (PRILOSEC) 20 MG capsule Take 1 capsule (20 mg total) by mouth daily. 30 capsule 1  . nitroGLYCERIN (NITRODUR - DOSED IN MG/24 HR) 0.2 mg/hr patch Apply 1/4 patch to the affected area every 24 hours (Patient not taking: Reported on 11/11/2018) 30 patch 11  . guaiFENesin (ROBITUSSIN) 100 MG/5ML SOLN Take 5 mLs (100 mg total) by mouth every 4 (four) hours as needed for cough or to loosen phlegm. (Patient not taking: Reported on 01/21/2019) 236 mL 0  . tamsulosin (FLOMAX) 0.4 MG CAPS capsule Take 1 capsule (0.4 mg total) by mouth daily. (Patient not taking: Reported  on 01/21/2019) 30 capsule 1  . Tdap (BOOSTRIX) 5-2.5-18.5 LF-MCG/0.5 injection Inject 0.5 mLs into the muscle as directed. (Patient not taking: Reported on 10/10/2018) 0.5 mL 0   No facility-administered medications prior to visit.     No Known Allergies  ROS Review of Systems  Constitutional: Negative.   HENT: Negative.   Eyes: Positive for pain and redness.  Respiratory: Negative.   Cardiovascular: Negative.   Gastrointestinal: Negative.   Endocrine: Negative.   Genitourinary: Negative.   Musculoskeletal: Negative.   Skin: Negative.   Allergic/Immunologic: Negative.   Neurological: Positive for dizziness and headaches.  Hematological: Negative.   Psychiatric/Behavioral: Negative.    Objective:    Physical Exam  Constitutional: He is oriented to person, place, and time. He appears well-developed.  HENT:  Head: Normocephalic and atraumatic.  Bilateral ear pain, erythema, and swelling.   Eyes: Conjunctivae are normal.  Neck: Normal range of motion. Neck supple.  Cardiovascular: Normal rate, normal heart sounds and intact distal pulses.  Pulmonary/Chest: Effort normal and breath sounds normal.  Abdominal: Soft. Bowel sounds are normal.  Musculoskeletal: Normal range of motion.  Neurological: He is alert and oriented to person, place, and time. He has normal reflexes.  Skin: Skin is warm and dry.  Psychiatric: He has a normal mood and affect. His behavior is normal. Judgment and thought content normal.  Nursing note and vitals reviewed.  BP 136/88 (BP Location: Left Arm, Patient Position: Sitting, Cuff Size: Large)   Pulse 78   Temp 98.1 F (36.7 C) (Oral)   Ht 5\' 3"  (1.6 m)   Wt 174 lb (78.9 kg)   SpO2 99%   BMI 30.82 kg/m  Wt Readings from Last 3 Encounters:  01/21/19 174 lb (78.9 kg)  12/23/18 173 lb (78.5 kg)  11/11/18 175 lb (79.4 kg)     Health Maintenance Due  Topic Date Due  . COLONOSCOPY  10/22/2008    There are no preventive care reminders to  display for this patient.  Lab Results  Component Value Date   TSH 0.956 10/10/2018   Lab Results  Component Value Date   WBC 2.9 (L) 10/10/2018   HGB 15.3 10/10/2018   HCT 44.8 10/10/2018   MCV 80 10/10/2018   PLT 277 10/10/2018   Lab Results  Component Value Date   NA 143 10/10/2018   K 4.1 10/10/2018   CO2 26 10/10/2018   GLUCOSE 102 (H) 10/10/2018   BUN 16 10/10/2018   CREATININE 1.01 10/10/2018   BILITOT 0.4 10/10/2018   ALKPHOS 73 10/10/2018  AST 21 10/10/2018   ALT 17 10/10/2018   PROT 7.1 10/10/2018   ALBUMIN 4.4 10/10/2018   CALCIUM 9.6 10/10/2018   Lab Results  Component Value Date   CHOL 192 05/26/2018   Lab Results  Component Value Date   HDL 48 05/26/2018   Lab Results  Component Value Date   LDLCALC 129 (H) 05/26/2018   Lab Results  Component Value Date   TRIG 74 05/26/2018   Lab Results  Component Value Date   CHOLHDL 4.0 05/26/2018   Lab Results  Component Value Date   HGBA1C 5.0 10/10/2018    Assessment & Plan:   1. Hospital discharge follow-up  2. History of pneumonia Stable today.  3. Cough Resolved.   4. Acute otitis media, unspecified otitis media type We will initiate Augmentin today.  - amoxicillin-clavulanate (AUGMENTIN) 875-125 MG tablet; Take 1 tablet by mouth 2 (two) times daily for 7 days.  Dispense: 14 tablet; Refill: 0  5. Gastroesophageal reflux disease, esophagitis presence not specified - omeprazole (PRILOSEC) 20 MG capsule; Take 1 capsule (20 mg total) by mouth daily.  Dispense: 30 capsule; Refill: 3  6. Lower urinary tract symptoms - tamsulosin (FLOMAX) 0.4 MG CAPS capsule; Take 1 capsule (0.4 mg total) by mouth daily.  Dispense: 30 capsule; Refill: 3  7. Follow up He will follow up in 3 months.  - fluticasone (FLONASE) 50 MCG/ACT nasal spray; Place 2 sprays into both nostrils daily.  Dispense: 16 g; Refill: 3 Meds ordered this encounter  Medications  . fluticasone (FLONASE) 50 MCG/ACT nasal spray     Sig: Place 2 sprays into both nostrils daily.    Dispense:  16 g    Refill:  3  . omeprazole (PRILOSEC) 20 MG capsule    Sig: Take 1 capsule (20 mg total) by mouth daily.    Dispense:  30 capsule    Refill:  3  . tamsulosin (FLOMAX) 0.4 MG CAPS capsule    Sig: Take 1 capsule (0.4 mg total) by mouth daily.    Dispense:  30 capsule    Refill:  3  . amoxicillin-clavulanate (AUGMENTIN) 875-125 MG tablet    Sig: Take 1 tablet by mouth 2 (two) times daily for 7 days.    Dispense:  14 tablet    Refill:  0    No orders of the defined types were placed in this encounter.   Referral Orders  No referral(s) requested today   Raliegh Ip,  MSN, FNP-C [facility] [address] [place], [place] [address] [phone]    Problem List Items Addressed This Visit      Nervous and Auditory   Acute otitis media   Relevant Medications   amoxicillin-clavulanate (AUGMENTIN) 875-125 MG tablet     Other   Cough    Other Visit Diagnoses    Hospital discharge follow-up    -  Primary   History of pneumonia       Elevated blood pressure reading       Relevant Medications   fluticasone (FLONASE) 50 MCG/ACT nasal spray   Gastroesophageal reflux disease, esophagitis presence not specified       Relevant Medications   omeprazole (PRILOSEC) 20 MG capsule   Lower urinary tract symptoms       Relevant Medications   tamsulosin (FLOMAX) 0.4 MG CAPS capsule   Follow up          Meds ordered this encounter  Medications  .  fluticasone (FLONASE) 50 MCG/ACT nasal spray    Sig: Place 2 sprays into both nostrils daily.    Dispense:  16 g    Refill:  3  . omeprazole (PRILOSEC) 20 MG capsule    Sig: Take 1 capsule (20 mg total) by mouth daily.    Dispense:  30 capsule    Refill:  3  . tamsulosin (FLOMAX) 0.4 MG CAPS capsule    Sig: Take 1 capsule (0.4 mg total) by mouth daily.    Dispense:  30 capsule    Refill:  3  . amoxicillin-clavulanate (AUGMENTIN)  875-125 MG tablet    Sig: Take 1 tablet by mouth 2 (two) times daily for 7 days.    Dispense:  14 tablet    Refill:  0    Follow-up: No follow-ups on file.    Kallie Locks, FNP

## 2019-01-21 NOTE — Progress Notes (Signed)
[id number] 

## 2019-02-02 MED FILL — FLUTICASONE PROP 50 MCG SPR: 50 | 30 days supply | Qty: 16 | Fill #1

## 2019-02-02 MED FILL — LISINOPRIL 5 MG TAB: 5 | 30 days supply | Qty: 30 | Fill #2

## 2019-03-09 MED FILL — LISINOPRIL 5 MG TAB: 5 | 30 days supply | Qty: 30 | Fill #3

## 2019-04-10 MED FILL — LISINOPRIL 5 MG TAB: 5 | 30 days supply | Qty: 30 | Fill #4

## 2019-04-20 MED FILL — FLUTICASONE PROP 50 MCG SPR: 50 | 30 days supply | Qty: 16 | Fill #2

## 2019-04-22 DIAGNOSIS — R0602 Shortness of breath: Secondary | ICD-10-CM

## 2019-04-22 HISTORY — DX: Shortness of breath: R06.02

## 2019-05-12 ENCOUNTER — Ambulatory Visit (INDEPENDENT_AMBULATORY_CARE_PROVIDER_SITE_OTHER): Payer: Medicaid Other | Admitting: Family Medicine

## 2019-05-12 ENCOUNTER — Other Ambulatory Visit: Payer: Self-pay

## 2019-05-12 ENCOUNTER — Encounter: Payer: Self-pay | Admitting: Family Medicine

## 2019-05-12 VITALS — BP 136/88 | HR 70 | Temp 97.9°F | Ht 63.0 in | Wt 174.0 lb

## 2019-05-12 DIAGNOSIS — H6690 Otitis media, unspecified, unspecified ear: Secondary | ICD-10-CM | POA: Diagnosis not present

## 2019-05-12 DIAGNOSIS — R0600 Dyspnea, unspecified: Secondary | ICD-10-CM | POA: Insufficient documentation

## 2019-05-12 DIAGNOSIS — R399 Unspecified symptoms and signs involving the genitourinary system: Secondary | ICD-10-CM

## 2019-05-12 DIAGNOSIS — R059 Cough, unspecified: Secondary | ICD-10-CM

## 2019-05-12 DIAGNOSIS — R05 Cough: Secondary | ICD-10-CM

## 2019-05-12 DIAGNOSIS — K219 Gastro-esophageal reflux disease without esophagitis: Secondary | ICD-10-CM | POA: Diagnosis not present

## 2019-05-12 DIAGNOSIS — Z8701 Personal history of pneumonia (recurrent): Secondary | ICD-10-CM | POA: Diagnosis not present

## 2019-05-12 DIAGNOSIS — R0609 Other forms of dyspnea: Secondary | ICD-10-CM | POA: Diagnosis not present

## 2019-05-12 DIAGNOSIS — Z09 Encounter for follow-up examination after completed treatment for conditions other than malignant neoplasm: Secondary | ICD-10-CM | POA: Diagnosis not present

## 2019-05-12 DIAGNOSIS — I10 Essential (primary) hypertension: Secondary | ICD-10-CM

## 2019-05-12 LAB — POCT URINALYSIS DIP (MANUAL ENTRY)
Bilirubin, UA: NEGATIVE
Blood, UA: NEGATIVE
Glucose, UA: NEGATIVE mg/dL
Ketones, POC UA: NEGATIVE mg/dL
Leukocytes, UA: NEGATIVE
Nitrite, UA: NEGATIVE
Protein Ur, POC: NEGATIVE mg/dL
Spec Grav, UA: >=1.03 — AB (ref 1.010–1.025)
Urobilinogen, UA: 0.2 E.U./dL
pH, UA: 5.5 (ref 5.0–8.0)

## 2019-05-12 MED ORDER — HYDROCORTISONE (PERIANAL) 2.5 % EX CREA: 1.0000 "application " | TOPICAL_CREAM | Freq: Two times a day (BID) | CUTANEOUS | 3 refills | Status: DC

## 2019-05-12 MED ORDER — OMEPRAZOLE 20 MG PO CPDR: 20.0000 mg | DELAYED_RELEASE_CAPSULE | Freq: Every day | ORAL | 3 refills | Status: DC

## 2019-05-12 MED ORDER — NITROGLYCERIN 0.2 MG/HR TD PT24: MEDICATED_PATCH | TRANSDERMAL | 11 refills | Status: DC

## 2019-05-12 MED ORDER — ALBUTEROL SULFATE HFA 108 (90 BASE) MCG/ACT IN AERS: 2.0000 | INHALATION_SPRAY | Freq: Four times a day (QID) | RESPIRATORY_TRACT | 6 refills | Status: DC | PRN

## 2019-05-12 MED ORDER — LISINOPRIL 5 MG PO TABS: 5.0000 mg | ORAL_TABLET | Freq: Every day | ORAL | 6 refills | Status: DC

## 2019-05-12 MED ORDER — TAMSULOSIN HCL 0.4 MG PO CAPS: 0.4000 mg | ORAL_CAPSULE | Freq: Every day | ORAL | 3 refills | Status: DC

## 2019-05-12 NOTE — Progress Notes (Signed)
[facility]   Established Patient Office Visit  Subjective:  Patient ID: Manuel Brooks, male    DOB: 07/07/1961  Age: 58 y.o. MRN: [id number]  CC:  Chief Complaint  Patient presents with  . Follow-up    Chronic condition     HPI Manuel Palmerli Beverly presents is a 58 year old male who presents for Follow Up today.   Past Medical History:  Diagnosis Date  . Allergy   . CAP (community acquired pneumonia) 01/2019  . Exertional shortness of breath 04/2019  . GERD (gastroesophageal reflux disease)   . Hypertension   . Leucopenia    Current Status: Since his last office visit, he is doing well with no complaints. He denies visual changes, chest pain, cough, shortness of breath, heart palpitations, and falls. He has occasional headaches and dizziness with position changes. Denies severe headaches, confusion, seizures, double vision, and blurred vision, nausea and vomiting.  He denies fevers, chills, fatigue, recent infections, weight loss, and night sweats. He has not had any and falls. No chest pain, heart palpitations, cough and shortness of breath reported. No reports of GI problems such as diarrhea, and constipation. She has no reports of blood in stools, dysuria and hematuria. No depression or anxiety reported. He denies pain today.   Past Surgical History:  Procedure Laterality Date  . BIOPSY BONE MARROW  09/21/2011      . OTHER SURGICAL HISTORY     was unable to have childern and had surgery so he could have children 1990    History reviewed. No pertinent family history.  Social History   Socioeconomic History  . Marital status: Married    Spouse name: Not on file  . Number of children: Not on file  . Years of education: Not on file  . Highest education level: Not on file  Occupational History  . Not on file  Social Needs  . Financial resource strain: Not on file  . Food insecurity    Worry: Not on file    Inability: Not on file  .  Transportation needs    Medical: Not on file    Non-medical: Not on file  Tobacco Use  . Smoking status: Former Smoker    Packs/day: 0.50    Types: Cigarettes    Start date: 10/22/1988    Quit date: 10/22/1998    Years since quitting: 20.5  . Smokeless tobacco: Never Used  Substance and Sexual Activity  . Alcohol use: No  . Drug use: No    Frequency: 1.0 times per week  . Sexual activity: Not Currently  Lifestyle  . Physical activity    Days per week: Not on file    Minutes per session: Not on file  . Stress: Not on file  Relationships  . Social Musicianconnections    Talks on phone: Not on file    Gets together: Not on file    Attends religious service: Not on file    Active member of club or organization: Not on file    Attends meetings of clubs or organizations: Not on file    Relationship status: Not on file  . Intimate partner violence    Fear of current or ex partner: Not on file    Emotionally abused: Not on file    Physically abused: Not on file    Forced sexual activity: Not on file  Other Topics Concern  . Not on file  Social History Narrative  .  Not on file    Outpatient Medications Prior to Visit  Medication Sig Dispense Refill  . fluticasone (FLONASE) 50 MCG/ACT nasal spray Place 2 sprays into both nostrils daily. 16 g 3  . lisinopril (PRINIVIL,ZESTRIL) 5 MG tablet Take 1 tablet (5 mg total) by mouth daily. 30 tablet 6  . nitroGLYCERIN (NITRODUR - DOSED IN MG/24 HR) 0.2 mg/hr patch Apply 1/4 patch to the affected area every 24 hours 30 patch 11  . omeprazole (PRILOSEC) 20 MG capsule Take 1 capsule (20 mg total) by mouth daily. 30 capsule 3  . pramoxine-hydrocortisone (ANALPRAM-HC) 1-1 % rectal cream Place 1 application rectally 2 (two) times daily. 30 g 1  . tamsulosin (FLOMAX) 0.4 MG CAPS capsule Take 1 capsule (0.4 mg total) by mouth daily. 30 capsule 3   No facility-administered medications prior to visit.     No Known Allergies  ROS Review of Systems   Constitutional: Negative.   HENT: Negative.   Eyes: Negative.   Respiratory: Negative.   Cardiovascular: Negative.   Gastrointestinal: Negative.   Endocrine: Negative.   Genitourinary: Negative.   Musculoskeletal: Negative.   Skin: Negative.   Allergic/Immunologic: Negative.   Neurological: Positive for dizziness (occasional ) and headaches (occasional ).  Hematological: Negative.   Psychiatric/Behavioral: Negative.    Objective:    Physical Exam  Constitutional: He is oriented to person, place, and time. He appears well-developed and well-nourished.  HENT:  Head: Normocephalic and atraumatic.  Eyes: Conjunctivae are normal.  Neck: Normal range of motion. Neck supple.  Cardiovascular: Normal rate, regular rhythm, normal heart sounds and intact distal pulses.  Pulmonary/Chest: Effort normal and breath sounds normal.  Abdominal: Soft. Bowel sounds are normal.  Musculoskeletal: Normal range of motion.  Neurological: He is alert and oriented to person, place, and time. He has normal reflexes.  Skin: Skin is warm and dry.  Psychiatric: He has a normal mood and affect. His behavior is normal. Judgment and thought content normal.  Nursing note and vitals reviewed.   BP 136/88 (BP Location: Left Arm, Patient Position: Sitting, Cuff Size: Small)   Pulse 70   Temp 97.9 F (36.6 C) (Oral)   Ht 5\' 3"  (1.6 m)   Wt 174 lb (78.9 kg)   SpO2 97%   BMI 30.82 kg/m  Wt Readings from Last 3 Encounters:  05/12/19 174 lb (78.9 kg)  01/21/19 174 lb (78.9 kg)  12/23/18 173 lb (78.5 kg)     Health Maintenance Due  Topic Date Due  . COLONOSCOPY  10/22/2008    There are no preventive care reminders to display for this patient.  Lab Results  Component Value Date   TSH 0.956 10/10/2018   Lab Results  Component Value Date   WBC 2.9 (L) 10/10/2018   HGB 15.3 10/10/2018   HCT 44.8 10/10/2018   MCV 80 10/10/2018   PLT 277 10/10/2018   Lab Results  Component Value Date   NA 143  10/10/2018   K 4.1 10/10/2018   CO2 26 10/10/2018   GLUCOSE 102 (H) 10/10/2018   BUN 16 10/10/2018   CREATININE 1.01 10/10/2018   BILITOT 0.4 10/10/2018   ALKPHOS 73 10/10/2018   AST 21 10/10/2018   ALT 17 10/10/2018   PROT 7.1 10/10/2018   ALBUMIN 4.4 10/10/2018   CALCIUM 9.6 10/10/2018   Lab Results  Component Value Date   CHOL 192 05/26/2018   Lab Results  Component Value Date   HDL 48 05/26/2018   Lab Results  Component Value Date   LDLCALC 129 (H) 05/26/2018   Lab Results  Component Value Date   TRIG 74 05/26/2018   Lab Results  Component Value Date   CHOLHDL 4.0 05/26/2018   Lab Results  Component Value Date   HGBA1C 5.0 10/10/2018   Assessment & Plan:   1. Essential hypertension The current medical regimen is effective; blood pressure is stable at 136/88 today; continue present plan and medications as prescribed. She will continue to decrease high sodium intake, excessive alcohol intake, increase potassium intake, smoking cessation, and increase physical activity of at least 30 minutes of cardio activity daily. She will continue to follow Heart Healthy or DASH diet. - lisinopril (ZESTRIL) 5 MG tablet; Take 1 tablet (5 mg total) by mouth daily.  Dispense: 30 tablet; Refill: 6 - nitroGLYCERIN (NITRODUR - DOSED IN MG/24 HR) 0.2 mg/hr patch; Apply 1/4 patch to the affected area every 24 hours  Dispense: 30 patch; Refill: 11  2. History of pneumonia No signs and symptoms of recurrence noted or reported.   3. Acute otitis media, unspecified otitis media type Resolved.   4. Dyspnea on exertion - albuterol (VENTOLIN HFA) 108 (90 Base) MCG/ACT inhaler; Inhale 2 puffs into the lungs every 6 (six) hours as needed for wheezing or shortness of breath.  Dispense: 6.7 g; Refill: 6  5. Cough - albuterol (VENTOLIN HFA) 108 (90 Base) MCG/ACT inhaler; Inhale 2 puffs into the lungs every 6 (six) hours as needed for wheezing or shortness of breath.  Dispense: 6.7 g; Refill: 6   6. Gastroesophageal reflux disease, esophagitis presence not specified - omeprazole (PRILOSEC) 20 MG capsule; Take 1 capsule (20 mg total) by mouth daily.  Dispense: 30 capsule; Refill: 3  7. Lower urinary tract symptoms - tamsulosin (FLOMAX) 0.4 MG CAPS capsule; Take 1 capsule (0.4 mg total) by mouth daily.  Dispense: 30 capsule; Refill: 3  8. Follow up He will follow up in 6 months.  - POCT urinalysis dipstick  Meds ordered this encounter  Medications  . albuterol (VENTOLIN HFA) 108 (90 Base) MCG/ACT inhaler    Sig: Inhale 2 puffs into the lungs every 6 (six) hours as needed for wheezing or shortness of breath.    Dispense:  6.7 g    Refill:  6  . lisinopril (ZESTRIL) 5 MG tablet    Sig: Take 1 tablet (5 mg total) by mouth daily.    Dispense:  30 tablet    Refill:  6  . nitroGLYCERIN (NITRODUR - DOSED IN MG/24 HR) 0.2 mg/hr patch    Sig: Apply 1/4 patch to the affected area every 24 hours    Dispense:  30 patch    Refill:  11  . omeprazole (PRILOSEC) 20 MG capsule    Sig: Take 1 capsule (20 mg total) by mouth daily.    Dispense:  30 capsule    Refill:  3  . tamsulosin (FLOMAX) 0.4 MG CAPS capsule    Sig: Take 1 capsule (0.4 mg total) by mouth daily.    Dispense:  30 capsule    Refill:  3  . hydrocortisone (ANUSOL-HC) 2.5 % rectal cream    Sig: Place 1 application rectally 2 (two) times daily.    Dispense:  30 g    Refill:  3    Orders Placed This Encounter  Procedures  . POCT urinalysis dipstick    Referral Orders  No referral(s) requested today    Raliegh IpNatalie Adric Wrede,  MSN, FNP-BC [facility] [address]  [place], [place] [address] [phone] fax    Problem List Items Addressed This Visit      Cardiovascular and Mediastinum   Hypertension - Primary   Relevant Medications   lisinopril (ZESTRIL) 5 MG tablet   nitroGLYCERIN (NITRODUR - DOSED IN MG/24 HR) 0.2 mg/hr patch      Digestive   Gastroesophageal reflux disease   Relevant Medications   omeprazole (PRILOSEC) 20 MG capsule     Nervous and Auditory   Acute otitis media     Other   Cough   Relevant Medications   albuterol (VENTOLIN HFA) 108 (90 Base) MCG/ACT inhaler   History of pneumonia    Other Visit Diagnoses    Dyspnea on exertion       Relevant Medications   albuterol (VENTOLIN HFA) 108 (90 Base) MCG/ACT inhaler   Lower urinary tract symptoms       Relevant Medications   tamsulosin (FLOMAX) 0.4 MG CAPS capsule   Follow up       Relevant Orders   POCT urinalysis dipstick (Completed)      Meds ordered this encounter  Medications  . albuterol (VENTOLIN HFA) 108 (90 Base) MCG/ACT inhaler    Sig: Inhale 2 puffs into the lungs every 6 (six) hours as needed for wheezing or shortness of breath.    Dispense:  6.7 g    Refill:  6  . lisinopril (ZESTRIL) 5 MG tablet    Sig: Take 1 tablet (5 mg total) by mouth daily.    Dispense:  30 tablet    Refill:  6  . nitroGLYCERIN (NITRODUR - DOSED IN MG/24 HR) 0.2 mg/hr patch    Sig: Apply 1/4 patch to the affected area every 24 hours    Dispense:  30 patch    Refill:  11  . omeprazole (PRILOSEC) 20 MG capsule    Sig: Take 1 capsule (20 mg total) by mouth daily.    Dispense:  30 capsule    Refill:  3  . tamsulosin (FLOMAX) 0.4 MG CAPS capsule    Sig: Take 1 capsule (0.4 mg total) by mouth daily.    Dispense:  30 capsule    Refill:  3  . hydrocortisone (ANUSOL-HC) 2.5 % rectal cream    Sig: Place 1 application rectally 2 (two) times daily.    Dispense:  30 g    Refill:  3    Follow-up: Return in about 6 months (around 11/12/2019).    Kallie LocksNatalie M Ysabela Keisler, FNP

## 2019-06-17 MED FILL — FLUTICASONE PROP 50 MCG SPR: 50 | 30 days supply | Qty: 16 | Fill #3

## 2019-07-27 ENCOUNTER — Encounter: Payer: Self-pay | Admitting: Family Medicine

## 2019-07-27 ENCOUNTER — Ambulatory Visit (INDEPENDENT_AMBULATORY_CARE_PROVIDER_SITE_OTHER): Payer: Medicaid Other | Admitting: Family Medicine

## 2019-07-27 ENCOUNTER — Other Ambulatory Visit: Payer: Self-pay

## 2019-07-27 VITALS — BP 122/83 | HR 68 | Temp 97.8°F | Ht 63.0 in | Wt 171.4 lb

## 2019-07-27 DIAGNOSIS — R399 Unspecified symptoms and signs involving the genitourinary system: Secondary | ICD-10-CM | POA: Diagnosis not present

## 2019-07-27 DIAGNOSIS — I10 Essential (primary) hypertension: Secondary | ICD-10-CM | POA: Diagnosis not present

## 2019-07-27 DIAGNOSIS — Z09 Encounter for follow-up examination after completed treatment for conditions other than malignant neoplasm: Secondary | ICD-10-CM

## 2019-07-27 DIAGNOSIS — Z9189 Other specified personal risk factors, not elsewhere classified: Secondary | ICD-10-CM

## 2019-07-27 DIAGNOSIS — Z789 Other specified health status: Secondary | ICD-10-CM

## 2019-07-27 DIAGNOSIS — K219 Gastro-esophageal reflux disease without esophagitis: Secondary | ICD-10-CM | POA: Diagnosis not present

## 2019-07-27 DIAGNOSIS — Z758 Other problems related to medical facilities and other health care: Secondary | ICD-10-CM

## 2019-07-27 DIAGNOSIS — Z889 Allergy status to unspecified drugs, medicaments and biological substances status: Secondary | ICD-10-CM

## 2019-07-27 LAB — POCT URINALYSIS DIPSTICK
Bilirubin, UA: NEGATIVE
Glucose, UA: NEGATIVE
Ketones, UA: NEGATIVE
Leukocytes, UA: NEGATIVE
Nitrite, UA: NEGATIVE
Protein, UA: NEGATIVE
Spec Grav, UA: 1.025 (ref 1.010–1.025)
Urobilinogen, UA: 0.2 E.U./dL
pH, UA: 5.5 (ref 5.0–8.0)

## 2019-07-27 MED ORDER — OMEPRAZOLE 20 MG PO CPDR: 20.0000 mg | DELAYED_RELEASE_CAPSULE | Freq: Every day | ORAL | 3 refills | Status: DC

## 2019-07-27 MED ORDER — CETIRIZINE HCL 10 MG PO TABS: 10.0000 mg | ORAL_TABLET | Freq: Every day | ORAL | 11 refills | Status: DC

## 2019-07-27 MED ORDER — TAMSULOSIN HCL 0.4 MG PO CAPS: 0.4000 mg | ORAL_CAPSULE | Freq: Every day | ORAL | 3 refills | Status: DC

## 2019-07-27 MED ORDER — FLUTICASONE PROPIONATE 50 MCG/ACT NA SUSP: 2.0000 | Freq: Every day | NASAL | 3 refills | Status: DC

## 2019-07-27 MED ORDER — LISINOPRIL 5 MG PO TABS: 5.0000 mg | ORAL_TABLET | Freq: Every day | ORAL | 6 refills | Status: DC

## 2019-07-27 MED FILL — LISINOPRIL 5 MG TABLET: 5 | 30 days supply | Qty: 30 | Fill #0

## 2019-07-27 MED FILL — FLUTICASONE PROP 50 MCG SPR: 50 | 30 days supply | Qty: 16 | Fill #0

## 2019-07-27 MED FILL — TAMSULOSIN HCL 0.4 MG CAP: 0.4 | 30 days supply | Qty: 30 | Fill #0

## 2019-07-27 MED FILL — OMEPRAZOLE 20 MG CAP: 20 | 30 days supply | Qty: 30 | Fill #0

## 2019-07-27 NOTE — Progress Notes (Signed)
[facility]    Established Patient Office Visit  Subjective:  Patient ID: Manuel Brooks, male    DOB: 12/22/1960  Age: 58 y.o. MRN: [id number]  CC:  Chief Complaint  Patient presents with  . Follow-up    mid abdomial pain, frequent voids for one mth  . translator    #[id number]    HPI Manuel Brooks is a 58 year old male who presents for follow up today.   Past Medical History:  Diagnosis Date  . Allergy   . CAP (community acquired pneumonia) 01/2019  . Exertional shortness of breath 04/2019  . GERD (gastroesophageal reflux disease)   . Hypertension   . Leucopenia    Current Status: Since his last office visit, he has c/o abdominal pain with reflux X 1 months. He is taking OTC medication for relief. He denies visual changes, chest pain, cough, shortness of breath, heart palpitations, and falls. He has occasional headaches and dizziness with position changes. Denies severe headaches, confusion, seizures, double vision, and blurred vision, nausea and vomiting. He denies fevers, chills, fatigue, recent infections, weight loss, and night sweats. No reports of GI problems such as diarrhea, and constipation. He has no reports of blood in stools, dysuria and hematuria. No depression or anxiety, and denies suicidal ideations, homicidal ideations, or auditory hallucinations. He denies pain today.   Past Surgical History:  Procedure Laterality Date  . BIOPSY BONE MARROW  09/21/2011      . OTHER SURGICAL HISTORY     was unable to have childern and had surgery so he could have children 1990    History reviewed. No pertinent family history.  Social History   Socioeconomic History  . Marital status: Married    Spouse name: Not on file  . Number of children: Not on file  . Years of education: Not on file  . Highest education level: Not on file  Occupational History  . Not on file  Social Needs  . Financial resource strain: Not on file  . Food  insecurity    Worry: Not on file    Inability: Not on file  . Transportation needs    Medical: Not on file    Non-medical: Not on file  Tobacco Use  . Smoking status: Former Smoker    Packs/day: 0.50    Types: Cigarettes    Start date: 10/22/1988    Quit date: 10/22/1998    Years since quitting: 20.7  . Smokeless tobacco: Never Used  Substance and Sexual Activity  . Alcohol use: No  . Drug use: No    Frequency: 1.0 times per week  . Sexual activity: Not Currently  Lifestyle  . Physical activity    Days per week: Not on file    Minutes per session: Not on file  . Stress: Not on file  Relationships  . Social Musician on phone: Not on file    Gets together: Not on file    Attends religious service: Not on file    Active member of club or organization: Not on file    Attends meetings of clubs or organizations: Not on file    Relationship status: Not on file  . Intimate partner violence    Fear of current or ex partner: Not on file    Emotionally abused: Not on file    Physically abused: Not on file    Forced sexual activity: Not on file  Other Topics  Concern  . Not on file  Social History Narrative  . Not on file    Outpatient Medications Prior to Visit  Medication Sig Dispense Refill  . albuterol (VENTOLIN HFA) 108 (90 Base) MCG/ACT inhaler Inhale 2 puffs into the lungs every 6 (six) hours as needed for wheezing or shortness of breath. 6.7 g 6  . hydrocortisone (ANUSOL-HC) 2.5 % rectal cream Place 1 application rectally 2 (two) times daily. 30 g 3  . fluticasone (FLONASE) 50 MCG/ACT nasal spray Place 2 sprays into both nostrils daily. 16 g 3  . lisinopril (ZESTRIL) 5 MG tablet Take 1 tablet (5 mg total) by mouth daily. 30 tablet 6  . nitroGLYCERIN (NITRODUR - DOSED IN MG/24 HR) 0.2 mg/hr patch Apply 1/4 patch to the affected area every 24 hours (Patient not taking: Reported on 07/27/2019) 30 patch 11  . omeprazole (PRILOSEC) 20 MG capsule Take 1 capsule (20 mg  total) by mouth daily. (Patient not taking: Reported on 07/27/2019) 30 capsule 3  . tamsulosin (FLOMAX) 0.4 MG CAPS capsule Take 1 capsule (0.4 mg total) by mouth daily. (Patient not taking: Reported on 07/27/2019) 30 capsule 3   No facility-administered medications prior to visit.     No Known Allergies  ROS Review of Systems  Constitutional: Negative.   HENT: Negative.   Eyes: Negative.   Respiratory: Negative.   Cardiovascular: Negative.   Gastrointestinal: Negative.   Endocrine: Negative.   Genitourinary: Positive for difficulty urinating.  Musculoskeletal: Positive for arthralgias (generalized).  Skin: Negative.   Allergic/Immunologic: Negative.   Neurological: Positive for dizziness (occasional ) and headaches (occasional ).  Hematological: Negative.   Psychiatric/Behavioral: Negative.       Objective:    Physical Exam  Constitutional: He is oriented to person, place, and time. He appears well-developed and well-nourished.  HENT:  Head: Normocephalic and atraumatic.  Eyes: Conjunctivae are normal.  Neck: Normal range of motion. Neck supple.  Cardiovascular: Normal rate, regular rhythm, normal heart sounds and intact distal pulses.  Pulmonary/Chest: Effort normal and breath sounds normal.  Abdominal: Soft. Bowel sounds are normal.  Musculoskeletal: Normal range of motion.  Neurological: He is alert and oriented to person, place, and time. He has normal reflexes.  Skin: Skin is warm and dry.  Psychiatric: He has a normal mood and affect. His behavior is normal. Judgment and thought content normal.  Nursing note and vitals reviewed.   BP 122/83 (BP Location: Left Arm, Patient Position: Sitting, Cuff Size: Normal)   Pulse 68   Temp 97.8 F (36.6 C) (Oral)   Ht 5\' 3"  (1.6 m)   Wt 171 lb 6.4 oz (77.7 kg)   BMI 30.36 kg/m  Wt Readings from Last 3 Encounters:  07/27/19 171 lb 6.4 oz (77.7 kg)  05/12/19 174 lb (78.9 kg)  01/21/19 174 lb (78.9 kg)     Health  Maintenance Due  Topic Date Due  . COLONOSCOPY  10/22/2008  . INFLUENZA VACCINE  05/23/2019    There are no preventive care reminders to display for this patient.  Lab Results  Component Value Date   TSH 0.956 10/10/2018   Lab Results  Component Value Date   WBC 2.9 (L) 10/10/2018   HGB 15.3 10/10/2018   HCT 44.8 10/10/2018   MCV 80 10/10/2018   PLT 277 10/10/2018   Lab Results  Component Value Date   NA 143 10/10/2018   K 4.1 10/10/2018   CO2 26 10/10/2018   GLUCOSE 102 (H) 10/10/2018  BUN 16 10/10/2018   CREATININE 1.01 10/10/2018   BILITOT 0.4 10/10/2018   ALKPHOS 73 10/10/2018   AST 21 10/10/2018   ALT 17 10/10/2018   PROT 7.1 10/10/2018   ALBUMIN 4.4 10/10/2018   CALCIUM 9.6 10/10/2018   Lab Results  Component Value Date   CHOL 192 05/26/2018   Lab Results  Component Value Date   HDL 48 05/26/2018   Lab Results  Component Value Date   LDLCALC 129 (H) 05/26/2018   Lab Results  Component Value Date   TRIG 74 05/26/2018   Lab Results  Component Value Date   CHOLHDL 4.0 05/26/2018   Lab Results  Component Value Date   HGBA1C 5.0 10/10/2018   Assessment & Plan:   1. Essential hypertension The current medical regimen is effective; blood pressure is stable at 122/83 today; continue present plan and medications as prescribed. He will continue to take medications as prescribed, to decrease high sodium intake, excessive alcohol intake, increase potassium intake, smoking cessation, and increase physical activity of at least 30 minutes of cardio activity daily. He will continue to follow Heart Healthy or DASH diet. - POCT urinalysis dipstick - lisinopril (ZESTRIL) 5 MG tablet; Take 1 tablet (5 mg total) by mouth daily.  Dispense: 30 tablet; Refill: 6  2. Language barrier  3. Gastroesophageal reflux disease - omeprazole (PRILOSEC) 20 MG capsule; Take 1 capsule (20 mg total) by mouth daily.  Dispense: 30 capsule; Refill: 3  4. History of multiple  allergies - fluticasone (FLONASE) 50 MCG/ACT nasal spray; Place 2 sprays into both nostrils daily.  Dispense: 16 g; Refill: 3 - cetirizine (ZYRTEC) 10 MG tablet; Take 1 tablet (10 mg total) by mouth daily.  Dispense: 30 tablet; Refill: 11  5. Lower urinary tract symptoms - tamsulosin (FLOMAX) 0.4 MG CAPS capsule; Take 1 capsule (0.4 mg total) by mouth daily.  Dispense: 30 capsule; Refill: 3  6. Follow up Follow up in 2 months for labs only. Follow up in 6 months for office visit.  Meds ordered this encounter  Medications  . omeprazole (PRILOSEC) 20 MG capsule    Sig: Take 1 capsule (20 mg total) by mouth daily.    Dispense:  30 capsule    Refill:  3  . fluticasone (FLONASE) 50 MCG/ACT nasal spray    Sig: Place 2 sprays into both nostrils daily.    Dispense:  16 g    Refill:  3  . cetirizine (ZYRTEC) 10 MG tablet    Sig: Take 1 tablet (10 mg total) by mouth daily.    Dispense:  30 tablet    Refill:  11  . lisinopril (ZESTRIL) 5 MG tablet    Sig: Take 1 tablet (5 mg total) by mouth daily.    Dispense:  30 tablet    Refill:  6  . tamsulosin (FLOMAX) 0.4 MG CAPS capsule    Sig: Take 1 capsule (0.4 mg total) by mouth daily.    Dispense:  30 capsule    Refill:  3    Orders Placed This Encounter  Procedures  . POCT urinalysis dipstick    Referral Orders  No referral(s) requested today    Kathe Becton,  MSN, FNP-BC [address] [place], [place] [address] [phone] fax   Problem List Items Addressed This Visit      Cardiovascular and Mediastinum   Hypertension - Primary   Relevant Medications  lisinopril (ZESTRIL) 5 MG tablet   Other Relevant Orders   POCT urinalysis dipstick (Completed)     Digestive   Gastroesophageal reflux disease   Relevant Medications   omeprazole (PRILOSEC) 20 MG capsule    Other Visit Diagnoses    Language barrier       History of  multiple allergies       Relevant Medications   fluticasone (FLONASE) 50 MCG/ACT nasal spray   cetirizine (ZYRTEC) 10 MG tablet   Lower urinary tract symptoms       Relevant Medications   tamsulosin (FLOMAX) 0.4 MG CAPS capsule   Follow up          Meds ordered this encounter  Medications  . omeprazole (PRILOSEC) 20 MG capsule    Sig: Take 1 capsule (20 mg total) by mouth daily.    Dispense:  30 capsule    Refill:  3  . fluticasone (FLONASE) 50 MCG/ACT nasal spray    Sig: Place 2 sprays into both nostrils daily.    Dispense:  16 g    Refill:  3  . cetirizine (ZYRTEC) 10 MG tablet    Sig: Take 1 tablet (10 mg total) by mouth daily.    Dispense:  30 tablet    Refill:  11  . lisinopril (ZESTRIL) 5 MG tablet    Sig: Take 1 tablet (5 mg total) by mouth daily.    Dispense:  30 tablet    Refill:  6  . tamsulosin (FLOMAX) 0.4 MG CAPS capsule    Sig: Take 1 capsule (0.4 mg total) by mouth daily.    Dispense:  30 capsule    Refill:  3    Follow-up: No follow-ups on file.    Kallie LocksNatalie M Raeya Merritts, FNP

## 2019-07-28 DIAGNOSIS — R399 Unspecified symptoms and signs involving the genitourinary system: Secondary | ICD-10-CM | POA: Insufficient documentation

## 2019-07-28 DIAGNOSIS — Z9189 Other specified personal risk factors, not elsewhere classified: Secondary | ICD-10-CM | POA: Insufficient documentation

## 2019-07-28 DIAGNOSIS — Z889 Allergy status to unspecified drugs, medicaments and biological substances status: Secondary | ICD-10-CM | POA: Insufficient documentation

## 2019-09-01 MED FILL — OMEPRAZOLE 20 MG CAP: 20 | 30 days supply | Qty: 30 | Fill #1

## 2019-09-01 MED FILL — TAMSULOSIN HCL 0.4 MG CAP: 0.4 | 30 days supply | Qty: 30 | Fill #1

## 2019-09-01 MED FILL — LISINOPRIL 5 MG TABLET: 5 | 30 days supply | Qty: 30 | Fill #1

## 2019-09-01 MED FILL — FLUTICASONE PROP 50 MCG SPR: 50 | 30 days supply | Qty: 16 | Fill #1

## 2019-09-25 ENCOUNTER — Other Ambulatory Visit: Payer: Medicaid Other

## 2019-10-05 MED FILL — TAMSULOSIN HCL 0.4 MG CAP: 0.4 | 30 days supply | Qty: 30 | Fill #2

## 2019-10-05 MED FILL — LISINOPRIL 5 MG TABLET: 5 | 30 days supply | Qty: 30 | Fill #2

## 2019-10-05 MED FILL — OMEPRAZOLE 20 MG CAP: 20 | 30 days supply | Qty: 30 | Fill #2

## 2019-10-05 MED FILL — FLUTICASONE PROP 50 MCG SPR: 50 | 30 days supply | Qty: 16 | Fill #2

## 2019-10-06 MED FILL — ALBUTEROL SULFATE HFA 108 (: 108 (90 BAS | 25 days supply | Qty: 18 | Fill #0

## 2019-10-16 ENCOUNTER — Encounter (HOSPITAL_COMMUNITY): Payer: Self-pay

## 2019-10-16 ENCOUNTER — Emergency Department (HOSPITAL_COMMUNITY)
Admission: EM | Admit: 2019-10-16 | Discharge: 2019-10-16 | Disposition: A | Discharge status: Home / Self Care | Payer: Medicaid Other | Attending: Emergency Medicine | Admitting: Emergency Medicine

## 2019-10-16 ENCOUNTER — Other Ambulatory Visit: Payer: Self-pay

## 2019-10-16 DIAGNOSIS — I10 Essential (primary) hypertension: Secondary | ICD-10-CM | POA: Diagnosis not present

## 2019-10-16 DIAGNOSIS — U071 COVID-19: Secondary | ICD-10-CM | POA: Diagnosis not present

## 2019-10-16 DIAGNOSIS — Z87891 Personal history of nicotine dependence: Secondary | ICD-10-CM | POA: Diagnosis not present

## 2019-10-16 DIAGNOSIS — Z79899 Other long term (current) drug therapy: Secondary | ICD-10-CM | POA: Diagnosis not present

## 2019-10-16 DIAGNOSIS — R112 Nausea with vomiting, unspecified: Secondary | ICD-10-CM | POA: Diagnosis not present

## 2019-10-16 DIAGNOSIS — R1013 Epigastric pain: Secondary | ICD-10-CM | POA: Diagnosis not present

## 2019-10-16 LAB — COMPREHENSIVE METABOLIC PANEL
ALT: 31 U/L (ref 0–44)
AST: 45 U/L — ABNORMAL HIGH (ref 15–41)
Albumin: 4 g/dL (ref 3.5–5.0)
Alkaline Phosphatase: 53 U/L (ref 38–126)
Anion gap: 12 (ref 5–15)
BUN: 11 mg/dL (ref 6–20)
CO2: 23 mmol/L (ref 22–32)
Calcium: 8.6 mg/dL — ABNORMAL LOW (ref 8.9–10.3)
Chloride: 96 mmol/L — ABNORMAL LOW (ref 98–111)
Creatinine, Ser: 1.06 mg/dL (ref 0.61–1.24)
GFR calc Af Amer: >60 mL/min (ref >60)
GFR calc non Af Amer: >60 mL/min (ref >60)
Glucose, Bld: 108 mg/dL — ABNORMAL HIGH (ref 70–99)
Potassium: 3.7 mmol/L (ref 3.5–5.1)
Sodium: 131 mmol/L — ABNORMAL LOW (ref 135–145)
Total Bilirubin: 0.6 mg/dL (ref 0.3–1.2)
Total Protein: 8 g/dL (ref 6.5–8.1)

## 2019-10-16 LAB — CBC WITH DIFFERENTIAL/PLATELET
Abs Immature Granulocytes: 0.01 10*3/uL (ref 0.00–0.07)
Basophils Absolute: 0 10*3/uL (ref 0.0–0.1)
Basophils Relative: 0 %
Eosinophils Absolute: 0 10*3/uL (ref 0.0–0.5)
Eosinophils Relative: 0 %
HCT: 45.2 % (ref 39.0–52.0)
Hemoglobin: 14.9 g/dL (ref 13.0–17.0)
Immature Granulocytes: 1 %
Lymphocytes Relative: 39 %
Lymphs Abs: 0.5 10*3/uL — ABNORMAL LOW (ref 0.7–4.0)
MCH: 26.9 pg (ref 26.0–34.0)
MCHC: 33 g/dL (ref 30.0–36.0)
MCV: 81.6 fL (ref 80.0–100.0)
Monocytes Absolute: 0.2 10*3/uL (ref 0.1–1.0)
Monocytes Relative: 19 %
Neutro Abs: 0.5 10*3/uL — ABNORMAL LOW (ref 1.7–7.7)
Neutrophils Relative %: 41 %
Platelets: 228 10*3/uL (ref 150–400)
RBC: 5.54 MIL/uL (ref 4.22–5.81)
RDW: 13.2 % (ref 11.5–15.5)
WBC: 1.2 10*3/uL — CL (ref 4.0–10.5)
nRBC: 0 % (ref 0.0–0.2)

## 2019-10-16 LAB — POC SARS CORONAVIRUS 2 AG -  ED: SARS Coronavirus 2 Ag: POSITIVE — AB

## 2019-10-16 LAB — LIPASE, BLOOD: Lipase: 50 U/L (ref 11–51)

## 2019-10-16 MED ORDER — ALUM & MAG HYDROXIDE-SIMETH 200-200-20 MG/5ML PO SUSP
30.0000 mL | Freq: Once | ORAL | Status: AC
  Administered 2019-10-16: 30 mL via ORAL
  Filled 2019-10-16: qty 30

## 2019-10-16 MED ORDER — FAMOTIDINE 20 MG PO TABS: 20.0000 mg | ORAL_TABLET | Freq: Two times a day (BID) | ORAL | 0 refills | Status: DC

## 2019-10-16 MED ORDER — ONDANSETRON 4 MG PO TBDP: 4.0000 mg | ORAL_TABLET | Freq: Three times a day (TID) | ORAL | 0 refills | Status: DC | PRN

## 2019-10-16 MED ORDER — ONDANSETRON HCL 4 MG/2ML IJ SOLN
4.0000 mg | Freq: Once | INTRAMUSCULAR | Status: AC
  Administered 2019-10-16: 10:00:00 4 mg via INTRAVENOUS
  Filled 2019-10-16: qty 2

## 2019-10-16 MED ORDER — FAMOTIDINE IN NACL 20-0.9 MG/50ML-% IV SOLN
20.0000 mg | Freq: Once | INTRAVENOUS | Status: AC
  Administered 2019-10-16: 10:00:00 20 mg via INTRAVENOUS
  Filled 2019-10-16: qty 50

## 2019-10-16 NOTE — Discharge Instructions (Signed)
Your Covid test was POSITIVE today.     Person Under Monitoring Name: Manuel Brooks  Location: [address] [place] [address]   Infection Prevention Recommendations for Individuals Confirmed to have, or Being Evaluated for, 2019 Novel Coronavirus (COVID-19) Infection Who Receive Care at Home  Individuals who are confirmed to have, or are being evaluated for, COVID-19 should follow the prevention steps below until a healthcare provider or local or state health department says they can return to normal activities.  Stay home except to get medical care You should restrict activities outside your home, except for getting medical care. Do not go to work, school, or public areas, and do not use public transportation or taxis.  Call ahead before visiting your doctor Before your medical appointment, call the healthcare provider and tell them that you have, or are being evaluated for, COVID-19 infection. This will help the healthcare provider's office take steps to keep other people from getting infected. Ask your healthcare provider to call the local or state health department.  Monitor your symptoms Seek prompt medical attention if your illness is worsening (e.g., difficulty breathing). Before going to your medical appointment, call the healthcare provider and tell them that you have, or are being evaluated for, COVID-19 infection. Ask your healthcare provider to call the local or state health department.  Wear a facemask You should wear a facemask that covers your nose and mouth when you are in the same room with other people and when you visit a healthcare provider. People who live with or visit you should also wear a facemask while they are in the same room with you.  Separate yourself from other people in your home As much as possible, you should stay in a different room from other people in your home. Also, you should use a separate bathroom, if available.  Avoid sharing  household items You should not share dishes, drinking glasses, cups, eating utensils, towels, bedding, or other items with other people in your home. After using these items, you should wash them thoroughly with soap and water.  Cover your coughs and sneezes Cover your mouth and nose with a tissue when you cough or sneeze, or you can cough or sneeze into your sleeve. Throw used tissues in a lined trash can, and immediately wash your hands with soap and water for at least 20 seconds or use an alcohol-based hand rub.  Wash your [employer] Corporationhands Wash your hands often and thoroughly with soap and water for at least 20 seconds. You can use an alcohol-based hand sanitizer if soap and water are not available and if your hands are not visibly dirty. Avoid touching your eyes, nose, and mouth with unwashed hands.   Prevention Steps for Caregivers and Household Members of Individuals Confirmed to have, or Being Evaluated for, COVID-19 Infection Being Cared for in the Home  If you live with, or provide care at home for, a person confirmed to have, or being evaluated for, COVID-19 infection please follow these guidelines to prevent infection:  Follow healthcare provider's instructions Make sure that you understand and can help the patient follow any healthcare provider instructions for all care.  Provide for the patient's basic needs You should help the patient with basic needs in the home and provide support for getting groceries, prescriptions, and other personal needs.  Monitor the patient's symptoms If they are getting sicker, call his or her medical provider and tell them that the patient has, or is being evaluated for, COVID-19 infection.  This will help the healthcare provider's office take steps to keep other people from getting infected. Ask the healthcare provider to call the local or state health department.  Limit the number of people who have contact with the patient If possible, have only one  caregiver for the patient. Other household members should stay in another home or place of residence. If this is not possible, they should stay in another room, or be separated from the patient as much as possible. Use a separate bathroom, if available. Restrict visitors who do not have an essential need to be in the home.  Keep older adults, very young children, and other sick people away from the patient Keep older adults, very young children, and those who have compromised immune systems or chronic health conditions away from the patient. This includes people with chronic heart, lung, or kidney conditions, diabetes, and cancer.  Ensure good ventilation Make sure that shared spaces in the home have good air flow, such as from an air conditioner or an opened window, weather permitting.  Wash your hands often Wash your hands often and thoroughly with soap and water for at least 20 seconds. You can use an alcohol based hand sanitizer if soap and water are not available and if your hands are not visibly dirty. Avoid touching your eyes, nose, and mouth with unwashed hands. Use disposable paper towels to dry your hands. If not available, use dedicated cloth towels and replace them when they become wet.  Wear a facemask and gloves Wear a disposable facemask at all times in the room and gloves when you touch or have contact with the patient's blood, body fluids, and/or secretions or excretions, such as sweat, saliva, sputum, nasal mucus, vomit, urine, or feces.  Ensure the mask fits over your nose and mouth tightly, and do not touch it during use. Throw out disposable facemasks and gloves after using them. Do not reuse. Wash your hands immediately after removing your facemask and gloves. If your personal clothing becomes contaminated, carefully remove clothing and launder. Wash your hands after handling contaminated clothing. Place all used disposable facemasks, gloves, and other waste in a lined  container before disposing them with other household waste. Remove gloves and wash your hands immediately after handling these items.  Do not share dishes, glasses, or other household items with the patient Avoid sharing household items. You should not share dishes, drinking glasses, cups, eating utensils, towels, bedding, or other items with a patient who is confirmed to have, or being evaluated for, COVID-19 infection. After the person uses these items, you should wash them thoroughly with soap and water.  Wash laundry thoroughly Immediately remove and wash clothes or bedding that have blood, body fluids, and/or secretions or excretions, such as sweat, saliva, sputum, nasal mucus, vomit, urine, or feces, on them. Wear gloves when handling laundry from the patient. Read and follow directions on labels of laundry or clothing items and detergent. In general, wash and dry with the warmest temperatures recommended on the label.  Clean all areas the individual has used often Clean all touchable surfaces, such as counters, tabletops, doorknobs, bathroom fixtures, toilets, phones, keyboards, tablets, and bedside tables, every day. Also, clean any surfaces that may have blood, body fluids, and/or secretions or excretions on them. Wear gloves when cleaning surfaces the patient has come in contact with. Use a diluted bleach solution (e.g., dilute bleach with 1 part bleach and 10 parts water) or a household disinfectant with a label that says  EPA-registered for coronaviruses. To make a bleach solution at home, add 1 tablespoon of bleach to 1 quart (4 cups) of water. For a larger supply, add  cup of bleach to 1 gallon (16 cups) of water. Read labels of cleaning products and follow recommendations provided on product labels. Labels contain instructions for safe and effective use of the cleaning product including precautions you should take when applying the product, such as wearing gloves or eye protection and  making sure you have good ventilation during use of the product. Remove gloves and wash hands immediately after cleaning.  Monitor yourself for signs and symptoms of illness Caregivers and household members are considered close contacts, should monitor their health, and will be asked to limit movement outside of the home to the extent possible. Follow the monitoring steps for close contacts listed on the symptom monitoring form.   ? If you have additional questions, contact your local health department or call the epidemiologist on call at [phone] (available 24/7). ? This guidance is subject to change. For the most up-to-date guidance from [facility], please refer to their website: [email or web address]

## 2019-10-16 NOTE — ED Triage Notes (Addendum)
Pt states he tested positive for COVID 9 days ago. Pt states that at that time, he had a fever, headache, and abd pain. Pt states fever and headache has gone away, but he has epigastric pain. Pt does not think that he has COVID though, as his wife and coworker were negative. Pt believes test was inaccurate. Pt endorses loss of appetite, emesis x 2, and diarrhea.

## 2019-10-16 NOTE — ED Provider Notes (Signed)
[facility]-EMERGENCY DEPT Provider Note   CSN: [id number] Arrival date & time: 10/16/19  0818     History Chief Complaint  Patient presents with  . COVID+  . Abdominal Pain    Manuel Palmerli Michaelangelo is a 58 y.o. male with a history of reflux presenting to emergency department with abdominal and epigastric pain.  The patient reports that he tested positive for Covid approximately 9 days ago.  At that time he was having fevers and headaches and mild epigastric pain.  He said the fever and headaches resolved about 5 days ago, but now he is worsening epigastric pain.  He said he felt nauseated and vomited this morning.  He believes is related to the medicines that he has been taking.  Someone told him to take zinc and vitamin D and vitamin C for his Covid disease, and he believes that all these pills are making her stomach upset.  He was also started on antihistamines for some nasal congestion.  Reports his pain is epigastric and burning in sensation.  He has had pain here before but not this intense.  He does feel nauseated.  No diarrhea.  Single episode of nonbloody emesis today.  Otherwise he feels well.  No hx of abdominal surgeries  NKDA  HPI     Past Medical History:  Diagnosis Date  . Allergy   . CAP (community acquired pneumonia) 01/2019  . Exertional shortness of breath 04/2019  . GERD (gastroesophageal reflux disease)   . Hypertension   . Leucopenia     Patient Active Problem List   Diagnosis Date Noted  . History of multiple allergies 07/28/2019  . Lower urinary tract symptoms 07/28/2019  . Dyspnea on exertion 05/12/2019  . History of pneumonia 01/21/2019  . Gastroesophageal reflux disease 01/21/2019  . Acute otitis media 12/23/2018  . Cough 12/23/2018  . Hypertension 05/20/2018  . Right shoulder pain 09/03/2014  . Traumatic tear of supraspinatus tendon of left shoulder 07/30/2014  . Pain, dental 08/06/2013  . Penile bleeding 08/06/2013  .  Leukocytopenia, unspecified 09/28/2011    Past Surgical History:  Procedure Laterality Date  . BIOPSY BONE MARROW  09/21/2011      . OTHER SURGICAL HISTORY     was unable to have childern and had surgery so he could have children 1990       History reviewed. No pertinent family history.  Social History   Tobacco Use  . Smoking status: Former Smoker    Packs/day: 0.50    Types: Cigarettes    Start date: 10/22/1988    Quit date: 10/22/1998    Years since quitting: 20.9  . Smokeless tobacco: Never Used  Substance Use Topics  . Alcohol use: No  . Drug use: No    Frequency: 1.0 times per week    Home Medications Prior to Admission medications   Medication Sig Start Date End Date Taking? Authorizing Provider  albuterol (VENTOLIN HFA) 108 (90 Base) MCG/ACT inhaler Inhale 2 puffs into the lungs every 6 (six) hours as needed for wheezing or shortness of breath. 05/12/19   Kallie LocksStroud, Natalie M, FNP  cetirizine (ZYRTEC) 10 MG tablet Take 1 tablet (10 mg total) by mouth daily. 07/27/19   Kallie LocksStroud, Natalie M, FNP  famotidine (PEPCID) 20 MG tablet Take 1 tablet (20 mg total) by mouth 2 (two) times daily. 10/16/19 11/15/19  Terald Sleeperrifan, Letishia Elliott J, MD  fluticasone (FLONASE) 50 MCG/ACT nasal spray Place 2 sprays into both nostrils daily. 07/27/19   Bradly ChrisStroud,  Ellie Lunch, FNP  hydrocortisone (ANUSOL-HC) 2.5 % rectal cream Place 1 application rectally 2 (two) times daily. 05/12/19   Azzie Glatter, FNP  lisinopril (ZESTRIL) 5 MG tablet Take 1 tablet (5 mg total) by mouth daily. 07/27/19   Azzie Glatter, FNP  nitroGLYCERIN (NITRODUR - DOSED IN MG/24 HR) 0.2 mg/hr patch Apply 1/4 patch to the affected area every 24 hours Patient not taking: Reported on 07/27/2019 05/12/19   Azzie Glatter, FNP  omeprazole (PRILOSEC) 20 MG capsule Take 1 capsule (20 mg total) by mouth daily. 07/27/19   Azzie Glatter, FNP  ondansetron (ZOFRAN ODT) 4 MG disintegrating tablet Take 1 tablet (4 mg total) by mouth every 8 (eight)  hours as needed for up to 15 doses for nausea or vomiting. 10/16/19   Wyvonnia Dusky, MD  tamsulosin (FLOMAX) 0.4 MG CAPS capsule Take 1 capsule (0.4 mg total) by mouth daily. 07/27/19   Azzie Glatter, FNP    Allergies    Patient has no known allergies.  Review of Systems   Review of Systems  Constitutional: Negative for chills and fever.  HENT: Positive for congestion.   Eyes: Negative for photophobia and visual disturbance.  Respiratory: Negative for cough and shortness of breath.   Cardiovascular: Negative for chest pain and palpitations.  Gastrointestinal: Positive for abdominal pain, nausea and vomiting. Negative for constipation and diarrhea.  Genitourinary: Negative for dysuria and hematuria.  Musculoskeletal: Negative for arthralgias and myalgias.  Skin: Negative for pallor and rash.  Neurological: Negative for syncope and headaches.  Psychiatric/Behavioral: Negative for agitation and confusion.  All other systems reviewed and are negative.   Physical Exam Updated Vital Signs BP 124/89   Pulse 77   Temp 97.8 F (36.6 C) (Oral)   Resp 18   SpO2 95%   Physical Exam Vitals and nursing note reviewed.  Constitutional:      Appearance: He is well-developed.  HENT:     Head: Normocephalic and atraumatic.  Eyes:     Conjunctiva/sclera: Conjunctivae normal.  Cardiovascular:     Rate and Rhythm: Normal rate and regular rhythm.     Heart sounds: No murmur.  Pulmonary:     Effort: Pulmonary effort is normal. No respiratory distress.     Breath sounds: Normal breath sounds.  Abdominal:     Palpations: Abdomen is soft.     Tenderness: There is abdominal tenderness in the epigastric area. There is no guarding or rebound. Negative signs include Murphy's sign and McBurney's sign.  Musculoskeletal:     Cervical back: Neck supple.  Skin:    General: Skin is warm and dry.  Neurological:     General: No focal deficit present.     Mental Status: He is alert and oriented  to person, place, and time.  Psychiatric:        Mood and Affect: Mood normal.        Behavior: Behavior normal.     ED Results / Procedures / Treatments   Labs (all labs ordered are listed, but only abnormal results are displayed) Labs Reviewed  COMPREHENSIVE METABOLIC PANEL - Abnormal; Notable for the following components:      Result Value   Sodium 131 (*)    Chloride 96 (*)    Glucose, Bld 108 (*)    Calcium 8.6 (*)    AST 45 (*)    All other components within normal limits  CBC WITH DIFFERENTIAL/PLATELET - Abnormal; Notable for the following components:  WBC 1.2 (*)    Neutro Abs 0.5 (*)    Lymphs Abs 0.5 (*)    All other components within normal limits  POC SARS CORONAVIRUS 2 AG -  ED - Abnormal; Notable for the following components:   SARS Coronavirus 2 Ag POSITIVE (*)    All other components within normal limits  LIPASE, BLOOD    EKG None  Radiology No results found.  Procedures Procedures (including critical care time)  Medications Ordered in ED Medications  ondansetron (ZOFRAN) injection 4 mg (4 mg Intravenous Given 10/16/19 0951)  famotidine (PEPCID) IVPB 20 mg premix (0 mg Intravenous Stopped 10/16/19 1001)  alum & mag hydroxide-simeth (MAALOX/MYLANTA) 200-200-20 MG/5ML suspension 30 mL (30 mLs Oral Given 10/16/19 7654)    ED Course  I have reviewed the triage vital signs and the nursing notes.  Pertinent labs & imaging results that were available during my care of the patient were reviewed by me and considered in my medical decision making (see chart for details).  58 year old male with a history reflux presenting with epigastric pain and vomiting worsening this morning.  He reports he had a history of Covid diagnosis, although he strongly believes this was a false positive, as his wife whom he lives with has been tested twice and has been negative for Covid.  Said he originally had fevers and headaches but these resolved.  Now his epigastric pain is  bothering him.  He is taking a large number of pillows which I do not believe he truly needs.  Advised him to stop the zinc and vitamin C and vitamin D as this may be upsetting his stomach.  We will try to give him a GI cocktail here as well as some IV Pepcid and fluids.  Does report very poor p.o. intake so we will check his electrolyte levels.  We will send a rapid Covid test.  He is otherwise extremely well-appearing on exam and has a fairly benign abdominal exam.  I do not believe he is an acute surgical abdomen.  He does not appear septic.  Daviel Allegretto was evaluated in Emergency Department on 10/16/2019 for the symptoms described in the history of present illness. He was evaluated in the context of the global COVID-19 pandemic, which necessitated consideration that the patient might be at risk for infection with the SARS-CoV-2 virus that causes COVID-19. Institutional protocols and algorithms that pertain to the evaluation of patients at risk for COVID-19 are in a state of rapid change based on information released by regulatory bodies including the CDC and federal and state organizations. These policies and algorithms were followed during the patient's care in the ED.  Final Clinical Impression(s) / ED Diagnoses Final diagnoses:  COVID-19    Rx / DC Orders ED Discharge Orders         Ordered    ondansetron (ZOFRAN ODT) 4 MG disintegrating tablet  Every 8 hours PRN     10/16/19 1156    famotidine (PEPCID) 20 MG tablet  2 times daily     10/16/19 1156           Terald Sleeper, MD 10/16/19 1450

## 2019-10-17 ENCOUNTER — Other Ambulatory Visit: Payer: Self-pay

## 2019-10-17 ENCOUNTER — Emergency Department (HOSPITAL_COMMUNITY)
Admission: EM | Admit: 2019-10-17 | Discharge: 2019-10-17 | Disposition: A | Discharge status: Home / Self Care | Payer: Medicaid Other | Attending: Emergency Medicine | Admitting: Emergency Medicine

## 2019-10-17 ENCOUNTER — Encounter (HOSPITAL_COMMUNITY): Payer: Self-pay

## 2019-10-17 DIAGNOSIS — I10 Essential (primary) hypertension: Secondary | ICD-10-CM | POA: Diagnosis not present

## 2019-10-17 DIAGNOSIS — R5383 Other fatigue: Secondary | ICD-10-CM | POA: Diagnosis not present

## 2019-10-17 DIAGNOSIS — Z79899 Other long term (current) drug therapy: Secondary | ICD-10-CM | POA: Diagnosis not present

## 2019-10-17 DIAGNOSIS — R197 Diarrhea, unspecified: Secondary | ICD-10-CM | POA: Diagnosis not present

## 2019-10-17 DIAGNOSIS — R1013 Epigastric pain: Secondary | ICD-10-CM

## 2019-10-17 DIAGNOSIS — U071 COVID-19: Secondary | ICD-10-CM | POA: Insufficient documentation

## 2019-10-17 DIAGNOSIS — Z87891 Personal history of nicotine dependence: Secondary | ICD-10-CM | POA: Diagnosis not present

## 2019-10-17 DIAGNOSIS — R112 Nausea with vomiting, unspecified: Secondary | ICD-10-CM | POA: Diagnosis not present

## 2019-10-17 MED ORDER — AEROCHAMBER PLUS FLO-VU MEDIUM MISC: 1.0000 | Freq: Once | Status: DC

## 2019-10-17 MED ORDER — SUCRALFATE 1 G PO TABS: 1.0000 g | ORAL_TABLET | Freq: Three times a day (TID) | ORAL | 0 refills | Status: DC

## 2019-10-17 MED ORDER — PROMETHAZINE-DM 6.25-15 MG/5ML PO SYRP: 5.0000 mL | ORAL_SOLUTION | Freq: Four times a day (QID) | ORAL | 0 refills | Status: DC | PRN

## 2019-10-17 MED ORDER — FLUTICASONE PROPIONATE 50 MCG/ACT NA SUSP: 2.0000 | Freq: Every day | NASAL | 0 refills | Status: DC

## 2019-10-17 MED ORDER — SUCRALFATE 1 GM/10ML PO SUSP
0.5000 g | Freq: Three times a day (TID) | ORAL | Status: DC
  Administered 2019-10-17: 0.5 g via ORAL
  Filled 2019-10-17: qty 10

## 2019-10-17 MED ORDER — ALBUTEROL SULFATE HFA 108 (90 BASE) MCG/ACT IN AERS
2.0000 | INHALATION_SPRAY | RESPIRATORY_TRACT | Status: DC | PRN
  Administered 2019-10-17: 2 via RESPIRATORY_TRACT
  Filled 2019-10-17: qty 6.7

## 2019-10-17 NOTE — Discharge Instructions (Addendum)
1. Medications: Carafate for abdominal pain, albuterol for shortness of breath, promethazine for cough, Flonase for nasal congestion, alternate tylenol and ibuprofen for fever control, continue usual home medications 2. Treatment: rest, drink plenty of fluids, isolate for the next 10 days 3. Follow Up: Please followup with your primary doctor if your symptoms are not improving after 10-14 days; Please return to the ER for high fevers, persistent vomiting, shortness of breath or other concerns.

## 2019-10-17 NOTE — ED Triage Notes (Addendum)
Pt coming in c/o 2 episodes of emesis since being seen earlier. Pt also c/o nasal congestion which is why he isn't getting any sleep. Dx with covid. Brought medication that he was prescribed earlier with him.   Translator Nael [phone]

## 2019-10-17 NOTE — ED Provider Notes (Signed)
[facility]-EMERGENCY DEPT Provider Note   CSN: [id number] Arrival date & time: 10/17/19  0129     History Chief Complaint  Patient presents with  . Covid +    Manuel Brooks is a 58 y.o. male with a hx of GERD, hypertension presents to the Emergency Department complaining of gradual, persistent, progressively worsening burning in his epigastrium, vomiting and nasal congestion. Patient denies chest pain or shortness of breath. He does report severe cough. Additionally he complains about nasal congestion. Patient reports he tested positive for Covid approximately 10 days ago. Initially he had some fevers but these have resolved. Patient has had some intermittent nonbloody and nonbilious emesis along with persistent watery stools for the last several days.  Patient evaluated for similar symptoms approximately 24 hours ago. At that time he reported taking zinc, vitamin D and vitamin C for his disease but he finds that his stomach is more upset when he takes them. He also has been taking antihistamines for his nasal congestion without significant improvement. Records reviewed. Labs were reassuring. Mild hyponatremia was noted and patient was found to be Covid positive.    Patient reports he felt some better after discharge home and filled his Pepcid and Zofran prescriptions. He reports that he subsequently had difficulty sleeping due to his nasal congestion. Patient also reports 2 episodes of nonbloody nonbilious emesis since his discharge yesterday. Since that time he has been able to drink water and eat fruit without difficulty. Patient reports his last episode of emesis was approximately 1 hour prior to arrival. Patient reports the medications he was discharged home with have been helping but his symptoms have not disappeared. He states he has returned for additional medication to resolve his symptoms. He denies worsening of his symptoms or new symptoms.  The history is provided by  the patient and medical records. No language interpreter was used.       Past Medical History:  Diagnosis Date  . Allergy   . CAP (community acquired pneumonia) 01/2019  . Exertional shortness of breath 04/2019  . GERD (gastroesophageal reflux disease)   . Hypertension   . Leucopenia     Patient Active Problem List   Diagnosis Date Noted  . History of multiple allergies 07/28/2019  . Lower urinary tract symptoms 07/28/2019  . Dyspnea on exertion 05/12/2019  . History of pneumonia 01/21/2019  . Gastroesophageal reflux disease 01/21/2019  . Acute otitis media 12/23/2018  . Cough 12/23/2018  . Hypertension 05/20/2018  . Right shoulder pain 09/03/2014  . Traumatic tear of supraspinatus tendon of left shoulder 07/30/2014  . Pain, dental 08/06/2013  . Penile bleeding 08/06/2013  . Leukocytopenia, unspecified 09/28/2011    Past Surgical History:  Procedure Laterality Date  . BIOPSY BONE MARROW  09/21/2011      . OTHER SURGICAL HISTORY     was unable to have childern and had surgery so he could have children 1990       No family history on file.  Social History   Tobacco Use  . Smoking status: Former Smoker    Packs/day: 0.50    Types: Cigarettes    Start date: 10/22/1988    Quit date: 10/22/1998    Years since quitting: 21.0  . Smokeless tobacco: Never Used  Substance Use Topics  . Alcohol use: No  . Drug use: No    Frequency: 1.0 times per week    Home Medications Prior to Admission medications   Medication Sig Start Date End  Date Taking? Authorizing Provider  albuterol (VENTOLIN HFA) 108 (90 Base) MCG/ACT inhaler Inhale 2 puffs into the lungs every 6 (six) hours as needed for wheezing or shortness of breath. 05/12/19   Kallie LocksStroud, Natalie M, FNP  cetirizine (ZYRTEC) 10 MG tablet Take 1 tablet (10 mg total) by mouth daily. 07/27/19   Kallie LocksStroud, Natalie M, FNP  famotidine (PEPCID) 20 MG tablet Take 1 tablet (20 mg total) by mouth 2 (two) times daily. 10/16/19 11/15/19   Terald Sleeperrifan, Matthew J, MD  fluticasone (FLONASE) 50 MCG/ACT nasal spray Place 2 sprays into both nostrils daily. 10/17/19   Alli Jasmer, Dahlia ClientHannah, PA-C  hydrocortisone (ANUSOL-HC) 2.5 % rectal cream Place 1 application rectally 2 (two) times daily. 05/12/19   Kallie LocksStroud, Natalie M, FNP  lisinopril (ZESTRIL) 5 MG tablet Take 1 tablet (5 mg total) by mouth daily. 07/27/19   Kallie LocksStroud, Natalie M, FNP  nitroGLYCERIN (NITRODUR - DOSED IN MG/24 HR) 0.2 mg/hr patch Apply 1/4 patch to the affected area every 24 hours Patient not taking: Reported on 07/27/2019 05/12/19   Kallie LocksStroud, Natalie M, FNP  omeprazole (PRILOSEC) 20 MG capsule Take 1 capsule (20 mg total) by mouth daily. 07/27/19   Kallie LocksStroud, Natalie M, FNP  ondansetron (ZOFRAN ODT) 4 MG disintegrating tablet Take 1 tablet (4 mg total) by mouth every 8 (eight) hours as needed for up to 15 doses for nausea or vomiting. 10/16/19   Terald Sleeperrifan, Matthew J, MD  promethazine-dextromethorphan (PROMETHAZINE-DM) 6.25-15 MG/5ML syrup Take 5 mLs by mouth 4 (four) times daily as needed for cough. 10/17/19   Tasman Zapata, Dahlia ClientHannah, PA-C  sucralfate (CARAFATE) 1 g tablet Take 1 tablet (1 g total) by mouth 4 (four) times daily -  with meals and at bedtime. 10/17/19   Jerolyn Flenniken, Dahlia ClientHannah, PA-C  tamsulosin (FLOMAX) 0.4 MG CAPS capsule Take 1 capsule (0.4 mg total) by mouth daily. 07/27/19   Kallie LocksStroud, Natalie M, FNP    Allergies    Patient has no known allergies.  Review of Systems   Review of Systems  Constitutional: Positive for fatigue. Negative for appetite change, chills and fever.  HENT: Positive for congestion. Negative for ear discharge, ear pain, mouth sores, postnasal drip, rhinorrhea, sinus pressure, sinus pain and sore throat.   Eyes: Negative for visual disturbance.  Respiratory: Positive for cough. Negative for chest tightness, shortness of breath, wheezing and stridor.   Cardiovascular: Negative for chest pain, palpitations and leg swelling.  Gastrointestinal: Positive for  abdominal pain. Negative for diarrhea, nausea and vomiting.  Genitourinary: Negative for dysuria, frequency, hematuria and urgency.  Musculoskeletal: Negative for arthralgias, back pain, myalgias and neck stiffness.  Skin: Negative for rash.  Neurological: Negative for syncope, light-headedness, numbness and headaches.  Hematological: Negative for adenopathy.  Psychiatric/Behavioral: The patient is not nervous/anxious.   All other systems reviewed and are negative.   Physical Exam Updated Vital Signs BP (!) 139/94 (BP Location: Right Arm)   Pulse 92   Temp 98.5 F (36.9 C) (Oral)   Resp (!) 21   Ht 5\' 6"  (1.676 m)   Wt 81.6 kg   SpO2 95%   BMI 29.05 kg/m   Physical Exam Vitals and nursing note reviewed.  Constitutional:      General: He is not in acute distress.    Appearance: He is well-developed. He is not diaphoretic.  HENT:     Head: Normocephalic and atraumatic.     Right Ear: External ear normal.     Left Ear: External ear normal.     Nose: Mucosal  edema present. No rhinorrhea.     Right Sinus: No maxillary sinus tenderness or frontal sinus tenderness.     Left Sinus: No maxillary sinus tenderness or frontal sinus tenderness.     Mouth/Throat:     Mouth: Mucous membranes are not pale and not cyanotic.     Pharynx: Uvula midline. No oropharyngeal exudate or posterior oropharyngeal erythema.     Tonsils: No tonsillar abscesses.  Eyes:     General: No scleral icterus.    Conjunctiva/sclera: Conjunctivae normal.  Cardiovascular:     Rate and Rhythm: Normal rate and regular rhythm.     Pulses: Normal pulses.          Radial pulses are 2+ on the right side and 2+ on the left side.  Pulmonary:     Effort: Pulmonary effort is normal. No tachypnea, accessory muscle usage, prolonged expiration, respiratory distress or retractions.     Breath sounds: Normal breath sounds. No stridor. No wheezing.     Comments: Equal chest rise. No increased work of breathing. Frequent  dry cough Abdominal:     General: There is no distension.     Palpations: Abdomen is soft.     Tenderness: There is no abdominal tenderness. There is no guarding or rebound.  Musculoskeletal:        General: No tenderness. Normal range of motion.     Cervical back: Full passive range of motion without pain and normal range of motion.     Right lower leg: No edema.     Left lower leg: No edema.     Comments: Moves all extremities equally and without difficulty.  Lymphadenopathy:     Cervical: No cervical adenopathy.  Skin:    General: Skin is warm and dry.     Capillary Refill: Capillary refill takes less than 2 seconds.     Findings: No rash.  Neurological:     Mental Status: He is alert.     GCS: GCS eye subscore is 4. GCS verbal subscore is 5. GCS motor subscore is 6.     Comments: Speech is clear and goal oriented.  Psychiatric:        Mood and Affect: Mood normal.     ED Results / Procedures / Treatments     EKG Interpretation  Date/Time:  Saturday October 17 2019 04:31:39 EST Ventricular Rate:  78 PR Interval:    QRS Duration: 97 QT Interval:  369 QTC Calculation: 421 R Axis:   22 Text Interpretation: Sinus rhythm Low voltage, precordial leads When compared with ECG of 09/24/2007, Low voltage QRS is now present Confirmed by Dione Booze ([id number]) on 10/17/2019 4:38:26 AM      Procedures Procedures (including critical care time)  Medications Ordered in ED Medications  sucralfate (CARAFATE) 1 GM/10ML suspension 0.5 g (0.5 g Oral Given 10/17/19 0428)  albuterol (VENTOLIN HFA) 108 (90 Base) MCG/ACT inhaler 2 puff (2 puffs Inhalation Given 10/17/19 0427)  AeroChamber Plus Flo-Vu Medium MISC 1 each (1 each Other Not Given 10/17/19 6045)    ED Course  I have reviewed the triage vital signs and the nursing notes.  Pertinent labs & imaging results that were available during my care of the patient were reviewed by me and considered in my medical decision making (see  chart for details).    MDM Rules/Calculators/A&P                       Manuel Brooks was evaluated in  Emergency Department on 10/17/2019 for the symptoms described in the history of present illness. He was evaluated in the context of the global COVID-19 pandemic, which necessitated consideration that the patient might be at risk for infection with the SARS-CoV-2 virus that causes COVID-19. Institutional protocols and algorithms that pertain to the evaluation of patients at risk for COVID-19 are in a state of rapid change based on information released by regulatory bodies including the CDC and federal and state organizations. These policies and algorithms were followed during the patient's care in the ED.  Patient presents with epigastric abdominal pain and nasal congestion which is preventing him from sleeping. 2 episodes of vomiting recently. Abdomen is soft and nontender on my exam. No rebound or guarding. He has tolerated p.o. here in the emergency department without difficulty. Suspect his symptoms are likely secondary to large volume vitamin usage creating a gastritis. Will add Carafate to regimen. EKG nonischemic. No evidence for acute coronary syndrome.  He does have some nasal congestion however no clinical evidence for bacterial sinusitis. Patient with persistent dry cough. Will give albuterol and Flonase.  Patient is well-appearing without tachycardia, respiratory distress or hypoxia.  Despite the fact that he was seen 24 hours ago I do not believe he needs admission for his Covid.  Did discuss with patient reasons to return to the emergency department and expected clinical course of symptoms.  Final Clinical Impression(s) / ED Diagnoses Final diagnoses:  COVID-19 virus infection  Epigastric abdominal pain    Rx / DC Orders ED Discharge Orders         Ordered    sucralfate (CARAFATE) 1 g tablet  3 times daily with meals & bedtime     10/17/19 0420    fluticasone (FLONASE) 50 MCG/ACT  nasal spray  Daily     10/17/19 0420    promethazine-dextromethorphan (PROMETHAZINE-DM) 6.25-15 MG/5ML syrup  4 times daily PRN     10/17/19 0429           Damyiah Moxley, Jarrett Soho, PA-C 83/15/17 [id number]    Delora Fuel, MD 73/71/06 [phone]

## 2019-10-17 NOTE — ED Notes (Signed)
Pt verbalized discharge instructions and follow up care. Alert and ambulatory. No IV. Has a ride home. Verbalized understanding of self-isolating.

## 2019-10-22 ENCOUNTER — Other Ambulatory Visit: Payer: Self-pay

## 2019-10-23 DIAGNOSIS — E785 Hyperlipidemia, unspecified: Secondary | ICD-10-CM

## 2019-10-23 DIAGNOSIS — R748 Abnormal levels of other serum enzymes: Secondary | ICD-10-CM

## 2019-10-23 HISTORY — DX: Hyperlipidemia, unspecified: E78.5

## 2019-10-23 HISTORY — DX: Abnormal levels of other serum enzymes: R74.8

## 2019-10-26 ENCOUNTER — Other Ambulatory Visit: Payer: Self-pay

## 2019-10-26 ENCOUNTER — Ambulatory Visit (HOSPITAL_COMMUNITY)
Admission: EM | Admit: 2019-10-26 | Discharge: 2019-10-26 | Disposition: A | Discharge status: Home / Self Care | Payer: Medicaid Other

## 2019-10-27 ENCOUNTER — Ambulatory Visit (INDEPENDENT_AMBULATORY_CARE_PROVIDER_SITE_OTHER): Payer: Medicaid Other | Admitting: Family Medicine

## 2019-10-27 DIAGNOSIS — Z09 Encounter for follow-up examination after completed treatment for conditions other than malignant neoplasm: Secondary | ICD-10-CM

## 2019-10-27 DIAGNOSIS — Z889 Allergy status to unspecified drugs, medicaments and biological substances status: Secondary | ICD-10-CM

## 2019-10-27 DIAGNOSIS — B342 Coronavirus infection, unspecified: Secondary | ICD-10-CM

## 2019-10-27 DIAGNOSIS — I10 Essential (primary) hypertension: Secondary | ICD-10-CM | POA: Diagnosis not present

## 2019-10-27 DIAGNOSIS — Z9189 Other specified personal risk factors, not elsewhere classified: Secondary | ICD-10-CM | POA: Diagnosis not present

## 2019-10-27 DIAGNOSIS — R0600 Dyspnea, unspecified: Secondary | ICD-10-CM

## 2019-10-27 DIAGNOSIS — K219 Gastro-esophageal reflux disease without esophagitis: Secondary | ICD-10-CM | POA: Diagnosis not present

## 2019-10-27 DIAGNOSIS — R399 Unspecified symptoms and signs involving the genitourinary system: Secondary | ICD-10-CM

## 2019-10-27 DIAGNOSIS — R059 Cough, unspecified: Secondary | ICD-10-CM

## 2019-10-27 DIAGNOSIS — R05 Cough: Secondary | ICD-10-CM

## 2019-10-27 DIAGNOSIS — R0609 Other forms of dyspnea: Secondary | ICD-10-CM

## 2019-10-27 MED ORDER — TAMSULOSIN HCL 0.4 MG PO CAPS: 0.4000 mg | ORAL_CAPSULE | Freq: Every day | ORAL | 6 refills | Status: DC

## 2019-10-27 MED ORDER — CETIRIZINE HCL 10 MG PO TABS: 10.0000 mg | ORAL_TABLET | Freq: Every day | ORAL | 6 refills | Status: DC

## 2019-10-27 MED ORDER — ALBUTEROL SULFATE HFA 108 (90 BASE) MCG/ACT IN AERS: 2.0000 | INHALATION_SPRAY | Freq: Four times a day (QID) | RESPIRATORY_TRACT | 6 refills | Status: DC | PRN

## 2019-10-27 MED ORDER — FLUTICASONE PROPIONATE 50 MCG/ACT NA SUSP: 2.0000 | Freq: Every day | NASAL | 6 refills | Status: DC

## 2019-10-27 MED ORDER — OMEPRAZOLE 20 MG PO CPDR: 20.0000 mg | DELAYED_RELEASE_CAPSULE | Freq: Every day | ORAL | 6 refills | Status: DC

## 2019-10-27 MED ORDER — LISINOPRIL 5 MG PO TABS: 5.0000 mg | ORAL_TABLET | Freq: Every day | ORAL | 6 refills | Status: DC

## 2019-10-27 NOTE — Progress Notes (Signed)
Virtual Visit via Telephone Note  I connected with Manuel Brooks on 10/27/19 at  9:00 AM EST by telephone and verified that I am speaking with the correct person using two identifiers.   I discussed the limitations, risks, security and privacy concerns of performing an evaluation and management service by telephone and the availability of in person appointments. I also discussed with the patient that there may be a patient responsible charge related to this service. The patient expressed understanding and agreed to proceed.   History of Present Illness: Past Medical History:  Diagnosis Date  . Allergy   . CAP (community acquired pneumonia) 01/2019  . Exertional shortness of breath 04/2019  . GERD (gastroesophageal reflux disease)   . Hypertension   . Leucopenia     No family history on file.  Social History   Tobacco Use  . Smoking status: Former Smoker    Packs/day: 0.50    Types: Cigarettes    Start date: 10/22/1988    Quit date: 10/22/1998    Years since quitting: 21.0  . Smokeless tobacco: Never Used  Substance Use Topics  . Alcohol use: No  . Drug use: No    Frequency: 1.0 times per week   Current Status: Since hislast office visit, he has had an ED visit and been tested positive for Coronavirus on 10/16/2019. He is currently at home on quarantine X 17 days. He states that he is doing well with no complaints. He reports occasional cough and shortness of breath. She denies fevers, chills, fatigue, recent infections, weight loss, and night sweats. He has not had any headaches, visual changes, dizziness, and falls. No chest pain, and heart palpitations.  No reports of GI problems such as nausea, vomiting, diarrhea, and constipation. He has no reports of blood in stools, dysuria and hematuria. No depression or anxiety, and denies suicidal ideations, homicidal ideations, or auditory hallucinations. He denies pain today.   Observations/Objective:  Telephone Virtual Visit   Assessment  and Plan:  1. Hospital discharge follow-up  2. Coronavirus infection Positive on 10/16/2019. Today his symptoms have resolved.   3. Dyspnea on exertion Stable. States that he has no reports of respiratory distress.  - albuterol (VENTOLIN HFA) 108 (90 Base) MCG/ACT inhaler; Inhale 2 puffs into the lungs every 6 (six) hours as needed for wheezing or shortness of breath.  Dispense: 6.7 g; Refill: 6  4. Cough Stable.  - albuterol (VENTOLIN HFA) 108 (90 Base) MCG/ACT inhaler; Inhale 2 puffs into the lungs every 6 (six) hours as needed for wheezing or shortness of breath.  Dispense: 6.7 g; Refill: 6  5. History of multiple allergies - cetirizine (ZYRTEC) 10 MG tablet; Take 1 tablet (10 mg total) by mouth daily.  Dispense: 30 tablet; Refill: 6  6. Essential hypertension He will continue to take medications as prescribed, to decrease high sodium intake, excessive alcohol intake, increase potassium intake, smoking cessation, and increase physical activity of at least 30 minutes of cardio activity daily. He will continue to follow Heart Healthy or DASH diet. - lisinopril (ZESTRIL) 5 MG tablet; Take 1 tablet (5 mg total) by mouth daily.  Dispense: 30 tablet; Refill: 6  7. Gastroesophageal reflux disease without esophagitis - omeprazole (PRILOSEC) 20 MG capsule; Take 1 capsule (20 mg total) by mouth daily.  Dispense: 30 capsule; Refill: 6  8. Lower urinary tract symptoms - tamsulosin (FLOMAX) 0.4 MG CAPS capsule; Take 1 capsule (0.4 mg total) by mouth daily.  Dispense: 30 capsule; Refill: 6  Follow Up Instructions: He will follow up in 2 months.     I discussed the assessment and treatment plan with the patient. The patient was provided an opportunity to ask questions and all were answered. The patient agreed with the plan and demonstrated an understanding of the instructions.   The patient was advised to call back or seek an in-person evaluation if the symptoms worsen or if the condition  fails to improve as anticipated.  I provided 20 minutes of non-face-to-face time during this encounter.   Kallie Locks, FNP

## 2019-10-28 DIAGNOSIS — B342 Coronavirus infection, unspecified: Secondary | ICD-10-CM | POA: Insufficient documentation

## 2019-11-03 ENCOUNTER — Emergency Department (HOSPITAL_COMMUNITY)
Admission: EM | Admit: 2019-11-03 | Discharge: 2019-11-03 | Disposition: A | Discharge status: Home / Self Care | Payer: Medicaid Other | Attending: Emergency Medicine | Admitting: Emergency Medicine

## 2019-11-03 ENCOUNTER — Emergency Department (HOSPITAL_COMMUNITY): Payer: Medicaid Other

## 2019-11-03 ENCOUNTER — Encounter (HOSPITAL_COMMUNITY): Payer: Self-pay

## 2019-11-03 DIAGNOSIS — Z8616 Personal history of COVID-19: Secondary | ICD-10-CM | POA: Diagnosis not present

## 2019-11-03 DIAGNOSIS — Z79899 Other long term (current) drug therapy: Secondary | ICD-10-CM | POA: Diagnosis not present

## 2019-11-03 DIAGNOSIS — I10 Essential (primary) hypertension: Secondary | ICD-10-CM | POA: Insufficient documentation

## 2019-11-03 DIAGNOSIS — R05 Cough: Secondary | ICD-10-CM | POA: Diagnosis not present

## 2019-11-03 DIAGNOSIS — J189 Pneumonia, unspecified organism: Secondary | ICD-10-CM | POA: Insufficient documentation

## 2019-11-03 DIAGNOSIS — U071 COVID-19: Secondary | ICD-10-CM | POA: Diagnosis not present

## 2019-11-03 DIAGNOSIS — Z87891 Personal history of nicotine dependence: Secondary | ICD-10-CM | POA: Diagnosis not present

## 2019-11-03 MED ORDER — DOXYCYCLINE HYCLATE 100 MG PO CAPS: 100.0000 mg | ORAL_CAPSULE | Freq: Two times a day (BID) | ORAL | 0 refills | Status: AC

## 2019-11-03 MED FILL — DOXYCYCLINE HYCLATE 100 MG: 100 | 7 days supply | Qty: 14 | Fill #0

## 2019-11-03 NOTE — Discharge Instructions (Signed)
Please take antibiotic as prescribed to cover any possible bacterial pneumonia.  Suspect the symptoms are related to your COVID-19.  Return to ER if you develop any difficulty breathing or other new concerning symptom.  Recommend follow-up with your primary doctor.

## 2019-11-03 NOTE — ED Notes (Signed)
Pt refused discharge vital signs

## 2019-11-03 NOTE — ED Provider Notes (Signed)
[facility] DEPT Provider Note   CSN: [id number] Arrival date & time: 11/03/19  1038     History Chief Complaint  Patient presents with  . Cough    Manuel Brooks is a 59 y.o. male.  Presents to the emergency room with chief complaint of cough.  Patient states diagnosed with COVID-19 back in December, has had continued cough since that time.  Also noted some nasal congestion.  No fevers.  States the other symptoms he was experiencing like myalgias, fatigue, nausea have gone away.  Concerned he may still have COVID-19.  HPI     Past Medical History:  Diagnosis Date  . Allergy   . CAP (community acquired pneumonia) 01/2019  . Exertional shortness of breath 04/2019  . GERD (gastroesophageal reflux disease)   . Hypertension   . Leucopenia     Patient Active Problem List   Diagnosis Date Noted  . Coronavirus infection 10/28/2019  . History of multiple allergies 07/28/2019  . Lower urinary tract symptoms 07/28/2019  . Dyspnea on exertion 05/12/2019  . History of pneumonia 01/21/2019  . Gastroesophageal reflux disease 01/21/2019  . Acute otitis media 12/23/2018  . Cough 12/23/2018  . Hypertension 05/20/2018  . Right shoulder pain 09/03/2014  . Traumatic tear of supraspinatus tendon of left shoulder 07/30/2014  . Pain, dental 08/06/2013  . Penile bleeding 08/06/2013  . Leukocytopenia, unspecified 09/28/2011    Past Surgical History:  Procedure Laterality Date  . BIOPSY BONE MARROW  09/21/2011      . OTHER SURGICAL HISTORY     was unable to have childern and had surgery so he could have children 1990       History reviewed. No pertinent family history.  Social History   Tobacco Use  . Smoking status: Former Smoker    Packs/day: 0.50    Types: Cigarettes    Start date: 10/22/1988    Quit date: 10/22/1998    Years since quitting: 21.0  . Smokeless tobacco: Never Used  Substance Use Topics  . Alcohol use: No  . Drug use: No    Frequency:  1.0 times per week    Home Medications Prior to Admission medications   Medication Sig Start Date End Date Taking? Authorizing Provider  albuterol (VENTOLIN HFA) 108 (90 Base) MCG/ACT inhaler Inhale 2 puffs into the lungs every 6 (six) hours as needed for wheezing or shortness of breath. 10/27/19   Azzie Glatter, FNP  cetirizine (ZYRTEC) 10 MG tablet Take 1 tablet (10 mg total) by mouth daily. 10/27/19   Azzie Glatter, FNP  doxycycline (VIBRAMYCIN) 100 MG capsule Take 1 capsule (100 mg total) by mouth 2 (two) times daily for 7 days. 11/03/19 11/10/19  Lucrezia Starch, MD  fluticasone (FLONASE) 50 MCG/ACT nasal spray Place 2 sprays into both nostrils daily. 10/27/19   Azzie Glatter, FNP  hydrocortisone (ANUSOL-HC) 2.5 % rectal cream Place 1 application rectally 2 (two) times daily. 05/12/19   Azzie Glatter, FNP  lisinopril (ZESTRIL) 5 MG tablet Take 1 tablet (5 mg total) by mouth daily. 10/27/19   Azzie Glatter, FNP  nitroGLYCERIN (NITRODUR - DOSED IN MG/24 HR) 0.2 mg/hr patch Apply 1/4 patch to the affected area every 24 hours 05/12/19   Azzie Glatter, FNP  omeprazole (PRILOSEC) 20 MG capsule Take 1 capsule (20 mg total) by mouth daily. 10/27/19   Azzie Glatter, FNP  tamsulosin (FLOMAX) 0.4 MG CAPS capsule Take 1 capsule (0.4 mg total) by  mouth daily. 10/27/19   Kallie Locks, FNP  vitamin C (ASCORBIC ACID) 250 MG tablet Take 250 mg by mouth daily.    [provider]  vitamin E 400 UNIT capsule Take 400 Units by mouth daily.    [provider]  Zinc 100 MG TABS Take 100 mg by mouth daily.    [provider]    Allergies    Patient has no known allergies.  Review of Systems   Review of Systems  Constitutional: Negative for chills and fever.  HENT: Negative for ear pain and sore throat.   Eyes: Negative for pain and visual disturbance.  Respiratory: Positive for cough. Negative for shortness of breath.   Cardiovascular: Negative for chest pain  and palpitations.  Gastrointestinal: Negative for abdominal pain and vomiting.  Genitourinary: Negative for dysuria and hematuria.  Musculoskeletal: Negative for arthralgias and back pain.  Skin: Negative for color change and rash.  Neurological: Negative for seizures and syncope.  All other systems reviewed and are negative.   Physical Exam Updated Vital Signs BP (!) 148/100   Pulse (!) 105   Temp 98.5 F (36.9 C) (Oral)   Resp 16   SpO2 96%   Physical Exam Vitals and nursing note reviewed.  Constitutional:      Appearance: He is well-developed.  HENT:     Head: Normocephalic and atraumatic.  Eyes:     Conjunctiva/sclera: Conjunctivae normal.  Cardiovascular:     Rate and Rhythm: Normal rate and regular rhythm.     Heart sounds: No murmur.  Pulmonary:     Effort: Pulmonary effort is normal. No respiratory distress.     Breath sounds: Normal breath sounds.  Abdominal:     Palpations: Abdomen is soft.     Tenderness: There is no abdominal tenderness.  Musculoskeletal:     Cervical back: Neck supple.  Skin:    General: Skin is warm and dry.  Neurological:     General: No focal deficit present.     Mental Status: He is alert and oriented to person, place, and time.  Psychiatric:        Mood and Affect: Mood normal.        Behavior: Behavior normal.     ED Results / Procedures / Treatments   Labs (all labs ordered are listed, but only abnormal results are displayed) Labs Reviewed - No data to display  EKG None  Radiology DG Chest Portable 1 View  Result Date: 11/03/2019 CLINICAL DATA:  Cough.  Recent COVID-19 positive EXAM: PORTABLE CHEST 1 VIEW COMPARISON:  January 03, 2019 FINDINGS: There is patchy airspace opacity in portions of each mid and lower lung zone, slightly more on the left than on the right. No consolidation. Heart is upper normal in size with pulmonary vascularity normal. No adenopathy. No bone lesions. IMPRESSION: Patchy airspace opacity  bilaterally consistent with multifocal pneumonia. No consolidation. Suspect atypical organism pneumonia. Stable cardiac silhouette.  No evident adenopathy. Electronically Signed   By: Bretta Bang III M.D.   On: 11/03/2019 12:56    Procedures Procedures (including critical care time)  Medications Ordered in ED Medications - No data to display  ED Course  I have reviewed the triage vital signs and the nursing notes.  Pertinent labs & imaging results that were available during my care of the patient were reviewed by me and considered in my medical decision making (see chart for details).  Clinical Course as of Nov 02 1714  Tue  Nov 03, 2019  1116 Review chart, diagnosed with COVID-19 12/25   [RD]  1322 CXR with patchy infiltrates   [RD]    Clinical Course User Index [RD] Milagros Loll, MD   MDM Rules/Calculators/A&P                      59 year old male presents to ER with ongoing cough in setting of recently diagnosed COVID-19.  Suspect his symptoms are most likely related to his prior diagnosis of Covid.  CXR with patchy infiltrates, no prior for comparison.  Either related from original Covid or less likely but still possible new pneumonia.  Patient is well-appearing, no hypoxia. Believe appropriate for outpatient management.  Will give Rx for doxycycline.  Utilized Nurse, learning disability throughout duration of visit.   After the discussed management above, the patient was determined to be safe for discharge.  The patient was in agreement with this plan and all questions regarding their care were answered.  ED return precautions were discussed and the patient will return to the ED with any significant worsening of condition.   Final Clinical Impression(s) / ED Diagnoses Final diagnoses:  Atypical pneumonia  COVID-19    Rx / DC Orders ED Discharge Orders         Ordered    doxycycline (VIBRAMYCIN) 100 MG capsule  2 times daily     11/03/19 1326           Milagros Loll, MD 11/03/19 1719

## 2019-11-03 NOTE — ED Triage Notes (Signed)
Pt returns today c/o cough that has not stopped . Pt also states that he has had nasal congestion.    Pt dx with COVID mid December

## 2019-11-04 ENCOUNTER — Other Ambulatory Visit: Payer: Self-pay | Admitting: Family Medicine

## 2019-11-04 ENCOUNTER — Ambulatory Visit: Payer: Medicaid Other | Admitting: Family Medicine

## 2019-11-04 DIAGNOSIS — Z8616 Personal history of COVID-19: Secondary | ICD-10-CM

## 2019-11-04 DIAGNOSIS — R05 Cough: Secondary | ICD-10-CM

## 2019-11-04 DIAGNOSIS — R059 Cough, unspecified: Secondary | ICD-10-CM

## 2019-11-04 MED ORDER — BENZONATATE 100 MG PO CAPS: 100.0000 mg | ORAL_CAPSULE | Freq: Two times a day (BID) | ORAL | 0 refills | Status: DC | PRN

## 2019-11-04 MED FILL — BENZONATATE 100 MG CAPS: 100 | 10 days supply | Qty: 20 | Fill #0

## 2019-11-10 ENCOUNTER — Telehealth: Payer: Self-pay | Admitting: Family Medicine

## 2019-11-10 NOTE — Telephone Encounter (Signed)
Mr. Manuel Brooks stated his symptoms are better. He does not need anything but a follow appointment. Appointment scheduled. Patient informed.

## 2019-11-13 ENCOUNTER — Ambulatory Visit: Payer: Medicaid Other | Admitting: Family Medicine

## 2019-11-20 ENCOUNTER — Other Ambulatory Visit: Payer: Self-pay

## 2019-11-20 ENCOUNTER — Ambulatory Visit (INDEPENDENT_AMBULATORY_CARE_PROVIDER_SITE_OTHER): Payer: Medicaid Other | Admitting: Family Medicine

## 2019-11-20 ENCOUNTER — Encounter: Payer: Self-pay | Admitting: Family Medicine

## 2019-11-20 VITALS — BP 119/75 | HR 81 | Temp 97.8°F | Ht 66.0 in | Wt 171.4 lb

## 2019-11-20 DIAGNOSIS — R0609 Other forms of dyspnea: Secondary | ICD-10-CM

## 2019-11-20 DIAGNOSIS — K219 Gastro-esophageal reflux disease without esophagitis: Secondary | ICD-10-CM | POA: Diagnosis not present

## 2019-11-20 DIAGNOSIS — I10 Essential (primary) hypertension: Secondary | ICD-10-CM

## 2019-11-20 DIAGNOSIS — Z0000 Encounter for general adult medical examination without abnormal findings: Secondary | ICD-10-CM

## 2019-11-20 DIAGNOSIS — Z8616 Personal history of COVID-19: Secondary | ICD-10-CM | POA: Diagnosis not present

## 2019-11-20 DIAGNOSIS — Z09 Encounter for follow-up examination after completed treatment for conditions other than malignant neoplasm: Secondary | ICD-10-CM

## 2019-11-20 DIAGNOSIS — R05 Cough: Secondary | ICD-10-CM | POA: Diagnosis not present

## 2019-11-20 DIAGNOSIS — R0600 Dyspnea, unspecified: Secondary | ICD-10-CM | POA: Diagnosis not present

## 2019-11-20 DIAGNOSIS — R059 Cough, unspecified: Secondary | ICD-10-CM

## 2019-11-20 LAB — POCT URINALYSIS DIPSTICK
Bilirubin, UA: NEGATIVE
Blood, UA: NEGATIVE
Glucose, UA: NEGATIVE
Ketones, UA: NEGATIVE
Leukocytes, UA: NEGATIVE
Nitrite, UA: NEGATIVE
Protein, UA: NEGATIVE
Spec Grav, UA: 1.015 (ref 1.010–1.025)
Urobilinogen, UA: 0.2 E.U./dL
pH, UA: 5.5 (ref 5.0–8.0)

## 2019-11-20 LAB — POCT GLYCOSYLATED HEMOGLOBIN (HGB A1C): Hemoglobin A1C: 5.6 % (ref 4.0–5.6)

## 2019-11-20 LAB — GLUCOSE, POCT (MANUAL RESULT ENTRY): POC Glucose: 102 mg/dl — AB (ref 70–99)

## 2019-11-20 NOTE — Progress Notes (Signed)
[facility]    Established Patient Office Visit  Subjective:  Patient ID: Manuel Brooks, male    DOB: 09-26-1961  Age: 59 y.o. MRN: [id number]  CC:  Chief Complaint  Patient presents with  . Hospitalization Follow-up    11/03/2019 Pneumonia  . Follow-up    HTN    HPI Manuel Brooks is a 59 year old male who presents for Follow Up today.   Past Medical History:  Diagnosis Date  . Allergy   . CAP (community acquired pneumonia) 01/2019  . Exertional shortness of breath 04/2019  . GERD (gastroesophageal reflux disease)   . Hypertension   . Leucopenia    Current Status: Since his last office visit, he is doing well with no complaints. He was recently diagnosed with Coronavirus Infection on 10/16/2019. Today, he denies visual changes, chest pain, cough, shortness of breath, heart palpitations, and falls. He has occasional headaches and dizziness with position changes. Denies severe headaches, confusion, seizures, double vision, and blurred vision, nausea and vomiting. He denies fevers, chills, fatigue, recent infections, weight loss, and night sweats. No reports of GI problems such as diarrhea, and constipation. He has no reports of blood in stools, dysuria and hematuria. No depression or anxiety reported. He denies suicidal ideations, homicidal ideations, or auditory hallucinations. He denies pain today.   Past Surgical History:  Procedure Laterality Date  . BIOPSY BONE MARROW  09/21/2011      . OTHER SURGICAL HISTORY     was unable to have childern and had surgery so he could have children 1990    History reviewed. No pertinent family history.  Social History   Socioeconomic History  . Marital status: Married    Spouse name: Not on file  . Number of children: Not on file  . Years of education: Not on file  . Highest education level: Not on file  Occupational History  . Not on file  Tobacco Use  . Smoking status: Former Smoker   Packs/day: 0.50    Types: Cigarettes    Start date: 10/22/1988    Quit date: 10/22/1998    Years since quitting: 21.0  . Smokeless tobacco: Never Used  Substance and Sexual Activity  . Alcohol use: No  . Drug use: No    Frequency: 1.0 times per week  . Sexual activity: Yes  Other Topics Concern  . Not on file  Social History Narrative  . Not on file   Social Determinants of Health   Financial Resource Strain:   . Difficulty of Paying Living Expenses: Not on file  Food Insecurity:   . Worried About Programme researcher, broadcasting/film/video in the Last Year: Not on file  . Ran Out of Food in the Last Year: Not on file  Transportation Needs:   . Lack of Transportation (Medical): Not on file  . Lack of Transportation (Non-Medical): Not on file  Physical Activity:   . Days of Exercise per Week: Not on file  . Minutes of Exercise per Session: Not on file  Stress:   . Feeling of Stress : Not on file  Social Connections:   . Frequency of Communication with Friends and Family: Not on file  . Frequency of Social Gatherings with Friends and Family: Not on file  . Attends Religious Services: Not on file  . Active Member of Clubs or Organizations: Not on file  . Attends Banker Meetings: Not on file  . Marital Status: Not  on file  Intimate Partner Violence:   . Fear of Current or Ex-Partner: Not on file  . Emotionally Abused: Not on file  . Physically Abused: Not on file  . Sexually Abused: Not on file    Outpatient Medications Prior to Visit  Medication Sig Dispense Refill  . albuterol (VENTOLIN HFA) 108 (90 Base) MCG/ACT inhaler Inhale 2 puffs into the lungs every 6 (six) hours as needed for wheezing or shortness of breath. 6.7 g 6  . cetirizine (ZYRTEC) 10 MG tablet Take 1 tablet (10 mg total) by mouth daily. 30 tablet 6  . fluticasone (FLONASE) 50 MCG/ACT nasal spray Place 2 sprays into both nostrils daily. 9.9 g 6  . hydrocortisone (ANUSOL-HC) 2.5 % rectal cream Place 1 application  rectally 2 (two) times daily. 30 g 3  . lisinopril (ZESTRIL) 5 MG tablet Take 1 tablet (5 mg total) by mouth daily. 30 tablet 6  . nitroGLYCERIN (NITRODUR - DOSED IN MG/24 HR) 0.2 mg/hr patch Apply 1/4 patch to the affected area every 24 hours 30 patch 11  . omeprazole (PRILOSEC) 20 MG capsule Take 1 capsule (20 mg total) by mouth daily. 30 capsule 6  . tamsulosin (FLOMAX) 0.4 MG CAPS capsule Take 1 capsule (0.4 mg total) by mouth daily. 30 capsule 6  . vitamin C (ASCORBIC ACID) 250 MG tablet Take 250 mg by mouth daily.    . vitamin E 400 UNIT capsule Take 400 Units by mouth daily.    . Zinc 100 MG TABS Take 100 mg by mouth daily.    . benzonatate (TESSALON) 100 MG capsule Take 1 capsule (100 mg total) by mouth 2 (two) times daily as needed for cough. 20 capsule 0   No facility-administered medications prior to visit.    No Known Allergies  ROS Review of Systems  Constitutional: Negative.   HENT: Negative.   Eyes: Negative.   Respiratory: Negative.   Cardiovascular: Negative.   Gastrointestinal: Negative.   Endocrine: Negative.   Genitourinary: Negative.   Musculoskeletal: Positive for arthralgias (generalized joint pain).  Skin: Negative.   Allergic/Immunologic: Negative.   Neurological: Negative.   Hematological: Negative.   Psychiatric/Behavioral: Negative.       Objective:    Physical Exam  Constitutional: He is oriented to person, place, and time. He appears well-developed and well-nourished.  HENT:  Head: Normocephalic and atraumatic.  Eyes: Conjunctivae are normal.  Cardiovascular: Normal rate, regular rhythm, normal heart sounds and intact distal pulses.  Pulmonary/Chest: Effort normal and breath sounds normal.  Abdominal: Soft. Bowel sounds are normal. He exhibits distension (obese).  Musculoskeletal:        General: Normal range of motion.     Cervical back: Normal range of motion and neck supple.  Neurological: He is alert and oriented to person, place, and  time. He has normal reflexes.  Skin: Skin is warm and dry.  Psychiatric: He has a normal mood and affect. His behavior is normal. Judgment and thought content normal.  Nursing note and vitals reviewed.   BP 119/75   Pulse 81   Temp 97.8 F (36.6 C) (Oral)   Ht 5\' 6"  (1.676 m)   Wt 171 lb 6.4 oz (77.7 kg)   SpO2 100%   BMI 27.66 kg/m  Wt Readings from Last 3 Encounters:  11/20/19 171 lb 6.4 oz (77.7 kg)  10/17/19 180 lb (81.6 kg)  07/27/19 171 lb 6.4 oz (77.7 kg)     Health Maintenance Due  Topic Date Due  .  COLONOSCOPY  10/22/2008  . INFLUENZA VACCINE  05/23/2019    There are no preventive care reminders to display for this patient.  Lab Results  Component Value Date   TSH 0.956 10/10/2018   Lab Results  Component Value Date   WBC 1.2 (LL) 10/16/2019   HGB 14.9 10/16/2019   HCT 45.2 10/16/2019   MCV 81.6 10/16/2019   PLT 228 10/16/2019   Lab Results  Component Value Date   NA 131 (L) 10/16/2019   K 3.7 10/16/2019   CO2 23 10/16/2019   GLUCOSE 108 (H) 10/16/2019   BUN 11 10/16/2019   CREATININE 1.06 10/16/2019   BILITOT 0.6 10/16/2019   ALKPHOS 53 10/16/2019   AST 45 (H) 10/16/2019   ALT 31 10/16/2019   PROT 8.0 10/16/2019   ALBUMIN 4.0 10/16/2019   CALCIUM 8.6 (L) 10/16/2019   ANIONGAP 12 10/16/2019   Lab Results  Component Value Date   CHOL 192 05/26/2018   Lab Results  Component Value Date   HDL 48 05/26/2018   Lab Results  Component Value Date   LDLCALC 129 (H) 05/26/2018   Lab Results  Component Value Date   TRIG 74 05/26/2018   Lab Results  Component Value Date   CHOLHDL 4.0 05/26/2018   Lab Results  Component Value Date   HGBA1C 5.6 11/20/2019    Assessment & Plan:   1. History of 2019 novel coronavirus disease (COVID-19) Diagnosed on 10/16/2019, in which he has completed his quarantine. Stable, with no signs or symptoms of virus noted or reported.   2. Cough Occasionally.   3. Dyspnea on exertion Stable.   4.  Gastroesophageal reflux disease without esophagitis  5. Essential hypertension The current medical regimen is effective; blood pressure is stable at 119/75 today; continue present plan and medications as prescribed. He will continue to take medications as prescribed, to decrease high sodium intake, excessive alcohol intake, increase potassium intake, smoking cessation, and increase physical activity of at least 30 minutes of cardio activity daily. He will continue to follow Heart Healthy or DASH diet.  6. Health care maintenance - POCT urinalysis dipstick - POCT glucose (manual entry) - POCT glycosylated hemoglobin (Hb A1C) - CBC with Differential - Comprehensive metabolic panel - Lipid Panel - TSH - PSA - Vitamin B12 - Vitamin D, 25-hydroxy  7. Follow up He will follow up in 6 months.   No orders of the defined types were placed in this encounter.   Orders Placed This Encounter  Procedures  . CBC with Differential  . Comprehensive metabolic panel  . Lipid Panel  . TSH  . PSA  . Vitamin B12  . Vitamin D, 25-hydroxy  . POCT urinalysis dipstick  . POCT glucose (manual entry)  . POCT glycosylated hemoglobin (Hb A1C)   Referral Orders  No referral(s) requested today    Raliegh Ip,  MSN, FNP-BC [facility] [address] [place], [place] [address] [phone] fax    Problem List Items Addressed This Visit      Cardiovascular and Mediastinum   Hypertension     Digestive   Gastroesophageal reflux disease     Other   Cough   Dyspnea on exertion    Other Visit Diagnoses    History of 2019 novel coronavirus disease (COVID-19)    -  Primary   Health care maintenance       Relevant Orders   POCT urinalysis dipstick (Completed)  POCT glucose (manual entry) (Completed)   POCT glycosylated hemoglobin (Hb A1C) (Completed)   CBC with Differential   Comprehensive metabolic panel     Lipid Panel   TSH   PSA   Vitamin B12   Vitamin D, 25-hydroxy   Follow up          No orders of the defined types were placed in this encounter.   Follow-up: Return in about 6 months (around 05/19/2020).    Azzie Glatter, FNP

## 2019-11-21 LAB — CBC WITH DIFFERENTIAL/PLATELET
Basophils Absolute: 0 10*3/uL (ref 0.0–0.2)
Basos: 1 %
EOS (ABSOLUTE): 0.1 10*3/uL (ref 0.0–0.4)
Eos: 2 %
Hematocrit: 41.4 % (ref 37.5–51.0)
Hemoglobin: 14.1 g/dL (ref 13.0–17.7)
Immature Grans (Abs): 0 10*3/uL (ref 0.0–0.1)
Immature Granulocytes: 1 %
Lymphocytes Absolute: 2.2 10*3/uL (ref 0.7–3.1)
Lymphs: 53 %
MCH: 27.5 pg (ref 26.6–33.0)
MCHC: 34.1 g/dL (ref 31.5–35.7)
MCV: 81 fL (ref 79–97)
Monocytes Absolute: 0.5 10*3/uL (ref 0.1–0.9)
Monocytes: 12 %
Neutrophils Absolute: 1.3 10*3/uL — ABNORMAL LOW (ref 1.4–7.0)
Neutrophils: 31 %
Platelets: 269 10*3/uL (ref 150–450)
RBC: 5.12 x10E6/uL (ref 4.14–5.80)
RDW: 14.1 % (ref 11.6–15.4)
WBC: 4.1 10*3/uL (ref 3.4–10.8)

## 2019-11-21 LAB — LIPID PANEL
Chol/HDL Ratio: 4.6 ratio (ref 0.0–5.0)
Cholesterol, Total: 233 mg/dL — ABNORMAL HIGH (ref 100–199)
HDL: 51 mg/dL (ref >39)
LDL Chol Calc (NIH): 167 mg/dL — ABNORMAL HIGH (ref 0–99)
Triglycerides: 87 mg/dL (ref 0–149)
VLDL Cholesterol Cal: 15 mg/dL (ref 5–40)

## 2019-11-21 LAB — COMPREHENSIVE METABOLIC PANEL
ALT: 71 IU/L — ABNORMAL HIGH (ref 0–44)
AST: 50 IU/L — ABNORMAL HIGH (ref 0–40)
Albumin/Globulin Ratio: 1.4 (ref 1.2–2.2)
Albumin: 4.3 g/dL (ref 3.8–4.8)
Alkaline Phosphatase: 70 IU/L (ref 39–117)
BUN/Creatinine Ratio: 14 (ref 10–24)
BUN: 13 mg/dL (ref 8–27)
Bilirubin Total: 0.4 mg/dL (ref 0.0–1.2)
CO2: 24 mmol/L (ref 20–29)
Calcium: 9.3 mg/dL (ref 8.6–10.2)
Chloride: 101 mmol/L (ref 96–106)
Creatinine, Ser: 0.91 mg/dL (ref 0.76–1.27)
GFR calc Af Amer: 105 mL/min/{1.73_m2} (ref >59)
GFR calc non Af Amer: 91 mL/min/{1.73_m2} (ref >59)
Globulin, Total: 3 g/dL (ref 1.5–4.5)
Glucose: 84 mg/dL (ref 65–99)
Potassium: 3.9 mmol/L (ref 3.5–5.2)
Sodium: 137 mmol/L (ref 134–144)
Total Protein: 7.3 g/dL (ref 6.0–8.5)

## 2019-11-21 LAB — VITAMIN B12: Vitamin B-12: 471 pg/mL (ref 232–1245)

## 2019-11-21 LAB — VITAMIN D 25 HYDROXY (VIT D DEFICIENCY, FRACTURES): Vit D, 25-Hydroxy: 21.9 ng/mL — ABNORMAL LOW (ref 30.0–100.0)

## 2019-11-21 LAB — TSH: TSH: 0.715 u[IU]/mL (ref 0.450–4.500)

## 2019-11-21 LAB — PSA: Prostate Specific Ag, Serum: 0.5 ng/mL (ref 0.0–4.0)

## 2019-11-23 ENCOUNTER — Encounter: Payer: Self-pay | Admitting: Family Medicine

## 2019-11-23 ENCOUNTER — Other Ambulatory Visit: Payer: Self-pay | Admitting: Family Medicine

## 2019-11-23 DIAGNOSIS — E559 Vitamin D deficiency, unspecified: Secondary | ICD-10-CM

## 2019-11-23 DIAGNOSIS — E782 Mixed hyperlipidemia: Secondary | ICD-10-CM

## 2019-11-23 HISTORY — DX: Vitamin D deficiency, unspecified: E55.9

## 2019-11-23 MED ORDER — ATORVASTATIN CALCIUM 10 MG PO TABS: 10.0000 mg | ORAL_TABLET | Freq: Every day | ORAL | 6 refills | Status: DC

## 2019-11-23 MED ORDER — VITAMIN E 180 MG (400 UNIT) PO CAPS: 400.0000 [IU] | ORAL_CAPSULE | Freq: Every day | ORAL | 6 refills | Status: DC

## 2019-11-23 MED FILL — ATORVASTATIN 10 MG TABLET: 10 | 30 days supply | Qty: 30 | Fill #0

## 2019-12-21 ENCOUNTER — Telehealth: Payer: Self-pay | Admitting: Family Medicine

## 2019-12-21 NOTE — Telephone Encounter (Signed)
Pt called and reminded of appointment 

## 2019-12-22 ENCOUNTER — Ambulatory Visit: Payer: Medicaid Other | Admitting: Family Medicine

## 2019-12-31 ENCOUNTER — Telehealth: Payer: Self-pay | Admitting: Family Medicine

## 2019-12-31 NOTE — Telephone Encounter (Signed)
Pt called and reminded of appointment 

## 2020-01-01 ENCOUNTER — Ambulatory Visit: Payer: Medicaid Other | Admitting: Family Medicine

## 2020-01-12 ENCOUNTER — Ambulatory Visit (INDEPENDENT_AMBULATORY_CARE_PROVIDER_SITE_OTHER): Payer: Medicaid Other | Admitting: Family Medicine

## 2020-01-12 ENCOUNTER — Other Ambulatory Visit: Payer: Self-pay

## 2020-01-12 ENCOUNTER — Encounter: Payer: Self-pay | Admitting: Family Medicine

## 2020-01-12 VITALS — BP 137/90 | HR 70 | Temp 98.0°F | Ht 63.0 in | Wt 175.2 lb

## 2020-01-12 DIAGNOSIS — Z889 Allergy status to unspecified drugs, medicaments and biological substances status: Secondary | ICD-10-CM

## 2020-01-12 DIAGNOSIS — E559 Vitamin D deficiency, unspecified: Secondary | ICD-10-CM

## 2020-01-12 DIAGNOSIS — Z09 Encounter for follow-up examination after completed treatment for conditions other than malignant neoplasm: Secondary | ICD-10-CM

## 2020-01-12 DIAGNOSIS — M79605 Pain in left leg: Secondary | ICD-10-CM | POA: Diagnosis not present

## 2020-01-12 DIAGNOSIS — R059 Cough, unspecified: Secondary | ICD-10-CM

## 2020-01-12 DIAGNOSIS — I10 Essential (primary) hypertension: Secondary | ICD-10-CM

## 2020-01-12 DIAGNOSIS — R399 Unspecified symptoms and signs involving the genitourinary system: Secondary | ICD-10-CM

## 2020-01-12 DIAGNOSIS — R0600 Dyspnea, unspecified: Secondary | ICD-10-CM

## 2020-01-12 DIAGNOSIS — R05 Cough: Secondary | ICD-10-CM | POA: Diagnosis not present

## 2020-01-12 DIAGNOSIS — Z9189 Other specified personal risk factors, not elsewhere classified: Secondary | ICD-10-CM | POA: Diagnosis not present

## 2020-01-12 DIAGNOSIS — E782 Mixed hyperlipidemia: Secondary | ICD-10-CM | POA: Insufficient documentation

## 2020-01-12 DIAGNOSIS — R0609 Other forms of dyspnea: Secondary | ICD-10-CM

## 2020-01-12 LAB — POCT URINALYSIS DIPSTICK
Bilirubin, UA: NEGATIVE
Blood, UA: NEGATIVE
Glucose, UA: NEGATIVE
Ketones, UA: NEGATIVE
Leukocytes, UA: NEGATIVE
Nitrite, UA: NEGATIVE
Protein, UA: NEGATIVE
Spec Grav, UA: >=1.03 — AB (ref 1.010–1.025)
Urobilinogen, UA: 0.2 E.U./dL
pH, UA: 6 (ref 5.0–8.0)

## 2020-01-12 MED ORDER — LISINOPRIL 5 MG PO TABS: 5.0000 mg | ORAL_TABLET | Freq: Every day | ORAL | 6 refills | Status: DC

## 2020-01-12 MED ORDER — VITAMIN C 250 MG PO TABS: 250.0000 mg | ORAL_TABLET | Freq: Every day | ORAL | 6 refills | Status: DC

## 2020-01-12 MED ORDER — TAMSULOSIN HCL 0.4 MG PO CAPS: 0.4000 mg | ORAL_CAPSULE | Freq: Every day | ORAL | 6 refills | Status: DC

## 2020-01-12 MED ORDER — IBUPROFEN 800 MG PO TABS: 800.0000 mg | ORAL_TABLET | Freq: Three times a day (TID) | ORAL | 3 refills | Status: DC | PRN

## 2020-01-12 MED ORDER — ATORVASTATIN CALCIUM 10 MG PO TABS: 10.0000 mg | ORAL_TABLET | Freq: Every day | ORAL | 6 refills | Status: DC

## 2020-01-12 MED ORDER — FLUTICASONE PROPIONATE 50 MCG/ACT NA SUSP: 2.0000 | Freq: Every day | NASAL | 6 refills | Status: DC

## 2020-01-12 MED ORDER — ALBUTEROL SULFATE HFA 108 (90 BASE) MCG/ACT IN AERS: 2.0000 | INHALATION_SPRAY | Freq: Four times a day (QID) | RESPIRATORY_TRACT | 6 refills | Status: DC | PRN

## 2020-01-12 MED ORDER — VITAMIN E 180 MG (400 UNIT) PO CAPS: 400.0000 [IU] | ORAL_CAPSULE | Freq: Every day | ORAL | 6 refills | Status: DC

## 2020-01-12 MED ORDER — CETIRIZINE HCL 10 MG PO TABS: 10.0000 mg | ORAL_TABLET | Freq: Every day | ORAL | 6 refills | Status: DC

## 2020-01-12 MED FILL — FLUTICASONE PROP 50 MCG SPR: 50 | 60 days supply | Qty: 16 | Fill #0

## 2020-01-12 MED FILL — TAMSULOSIN HCL 0.4 MG CAP: 0.4 | 90 days supply | Qty: 90 | Fill #0

## 2020-01-12 MED FILL — LISINOPRIL 5 MG TABLET: 5 | 90 days supply | Qty: 90 | Fill #0

## 2020-01-12 MED FILL — IBUPROFEN 800 MG TABLET: 800 | 40 days supply | Qty: 120 | Fill #0

## 2020-01-12 MED FILL — ATORVASTATIN 10 MG TABLET: 10 | 30 days supply | Qty: 30 | Fill #0

## 2020-01-12 MED FILL — ALBUTEROL SULFATE HFA 108 (: 108 (90 BAS | 25 days supply | Qty: 18 | Fill #0

## 2020-01-12 NOTE — Progress Notes (Signed)
[facility]    Established Patient Office Visit  Subjective:  Patient ID: Manuel Brooks, male    DOB: [birth year]  Age: 59 y.o. MRN: [id number]  CC:  Chief Complaint  Patient presents with  . Follow-up    HTN  . left hip pain    on/off pain    HPI Arye Weyenberg is a 59 year old male who presents for Follow Up today.   Past Medical History:  Diagnosis Date  . Allergy   . CAP (community acquired pneumonia) 01/2019  . Elevated liver enzymes 10/2019  . Exertional shortness of breath 04/2019  . GERD (gastroesophageal reflux disease)   . Hyperlipidemia 10/2019  . Hypertension   . Leucopenia   . Vitamin D deficiency 11/2019   Current Status: Since his last office visit, he has c/o recent muscle strain of left leg, while at work. He is not taking any medications for pain. He states that he is not taking his hypertensive medications regularly. He denies visual changes, chest pain, cough, shortness of breath, heart palpitations, and falls. He has occasional headaches and dizziness with position changes. Denies severe headaches, confusion, seizures, double vision, and blurred vision, nausea and vomiting. He denies fevers, chills, fatigue, recent infections, weight loss, and night sweats. No reports of GI problems such as diarrhea, and constipation. He has no reports of blood in stools, dysuria and hematuria. No depression or anxiety reported today. He denies suicidal ideations, homicidal ideations, or auditory hallucinations.   Past Surgical History:  Procedure Laterality Date  . BIOPSY BONE MARROW  09/21/2011      . OTHER SURGICAL HISTORY     was unable to have childern and had surgery so he could have children 1990    History reviewed. No pertinent family history.  Social History   Socioeconomic History  . Marital status: Married    Spouse name: Not on file  . Number of children: Not on file  . Years of education: Not on file  . Highest  education level: Not on file  Occupational History  . Not on file  Tobacco Use  . Smoking status: Former Smoker    Packs/day: 0.50    Types: Cigarettes    Start date: 10/22/1988    Quit date: 10/22/1998    Years since quitting: 21.2  . Smokeless tobacco: Never Used  Substance and Sexual Activity  . Alcohol use: No  . Drug use: No    Frequency: 1.0 times per week  . Sexual activity: Yes  Other Topics Concern  . Not on file  Social History Narrative  . Not on file   Social Determinants of Health   Financial Resource Strain:   . Difficulty of Paying Living Expenses:   Food Insecurity:   . Worried About Charity fundraiser in the Last Year:   . Arboriculturist in the Last Year:   Transportation Needs:   . Film/video editor (Medical):   Marland Kitchen Lack of Transportation (Non-Medical):   Physical Activity:   . Days of Exercise per Week:   . Minutes of Exercise per Session:   Stress:   . Feeling of Stress :   Social Connections:   . Frequency of Communication with Friends and Family:   . Frequency of Social Gatherings with Friends and Family:   . Attends Religious Services:   . Active Member of Clubs or Organizations:   . Attends Archivist Meetings:   .  Marital Status:   Intimate Partner Violence:   . Fear of Current or Ex-Partner:   . Emotionally Abused:   Marland Kitchen Physically Abused:   . Sexually Abused:     Outpatient Medications Prior to Visit  Medication Sig Dispense Refill  . hydrocortisone (ANUSOL-HC) 2.5 % rectal cream Place 1 application rectally 2 (two) times daily. 30 g 3  . nitroGLYCERIN (NITRODUR - DOSED IN MG/24 HR) 0.2 mg/hr patch Apply 1/4 patch to the affected area every 24 hours 30 patch 11  . Zinc 100 MG TABS Take 100 mg by mouth daily.    Marland Kitchen albuterol (VENTOLIN HFA) 108 (90 Base) MCG/ACT inhaler Inhale 2 puffs into the lungs every 6 (six) hours as needed for wheezing or shortness of breath. 6.7 g 6  . atorvastatin (LIPITOR) 10 MG tablet Take 1 tablet (10  mg total) by mouth daily. 30 tablet 6  . cetirizine (ZYRTEC) 10 MG tablet Take 1 tablet (10 mg total) by mouth daily. 30 tablet 6  . fluticasone (FLONASE) 50 MCG/ACT nasal spray Place 2 sprays into both nostrils daily. 9.9 g 6  . lisinopril (ZESTRIL) 5 MG tablet Take 1 tablet (5 mg total) by mouth daily. 30 tablet 6  . tamsulosin (FLOMAX) 0.4 MG CAPS capsule Take 1 capsule (0.4 mg total) by mouth daily. 30 capsule 6  . vitamin C (ASCORBIC ACID) 250 MG tablet Take 250 mg by mouth daily.    . vitamin E 180 MG (400 UNITS) capsule Take 1 capsule (400 Units total) by mouth daily. 30 capsule 6  . omeprazole (PRILOSEC) 20 MG capsule Take 1 capsule (20 mg total) by mouth daily. (Patient not taking: Reported on 01/12/2020) 30 capsule 6   No facility-administered medications prior to visit.    No Known Allergies  ROS Review of Systems  Constitutional: Negative.   HENT: Negative.   Eyes: Negative.   Respiratory: Negative.   Cardiovascular: Negative.   Gastrointestinal: Positive for abdominal distention (obese).  Endocrine: Negative.   Genitourinary: Negative.   Musculoskeletal: Positive for arthralgias (generalized joint pain).       Left leg pain  Skin: Negative.   Allergic/Immunologic: Negative.   Neurological: Positive for dizziness (occasional ) and headaches (occasional ).  Psychiatric/Behavioral: Negative.       Objective:    Physical Exam  Constitutional: He is oriented to person, place, and time. He appears well-developed and well-nourished.  HENT:  Head: Normocephalic and atraumatic.  Eyes: Conjunctivae are normal.  Cardiovascular: Normal rate, regular rhythm, normal heart sounds and intact distal pulses.  Pulmonary/Chest: Effort normal and breath sounds normal.  Abdominal: Soft. Bowel sounds are normal. He exhibits distension (obese).  Musculoskeletal:        General: Normal range of motion.     Cervical back: Normal range of motion and neck supple.  Neurological: He is  alert and oriented to person, place, and time. He has normal reflexes.  Skin: Skin is warm and dry.  Psychiatric: He has a normal mood and affect. His behavior is normal. Judgment and thought content normal.  Nursing note and vitals reviewed.   BP 137/90   Pulse 70   Temp 98 F (36.7 C) (Oral)   Ht 5\' 3"  (1.6 m)   Wt 175 lb 3.2 oz (79.5 kg)   SpO2 100%   BMI 31.04 kg/m  Wt Readings from Last 3 Encounters:  01/12/20 175 lb 3.2 oz (79.5 kg)  11/20/19 171 lb 6.4 oz (77.7 kg)  10/17/19 180 lb (81.6  kg)     Health Maintenance Due  Topic Date Due  . COLONOSCOPY  Never done  . INFLUENZA VACCINE  05/23/2019    There are no preventive care reminders to display for this patient.  Lab Results  Component Value Date   TSH 0.715 11/20/2019   Lab Results  Component Value Date   WBC 4.1 11/20/2019   HGB 14.1 11/20/2019   HCT 41.4 11/20/2019   MCV 81 11/20/2019   PLT 269 11/20/2019   Lab Results  Component Value Date   NA 137 11/20/2019   K 3.9 11/20/2019   CO2 24 11/20/2019   GLUCOSE 84 11/20/2019   BUN 13 11/20/2019   CREATININE 0.91 11/20/2019   BILITOT 0.4 11/20/2019   ALKPHOS 70 11/20/2019   AST 50 (H) 11/20/2019   ALT 71 (H) 11/20/2019   PROT 7.3 11/20/2019   ALBUMIN 4.3 11/20/2019   CALCIUM 9.3 11/20/2019   ANIONGAP 12 10/16/2019   Lab Results  Component Value Date   CHOL 233 (H) 11/20/2019   Lab Results  Component Value Date   HDL 51 11/20/2019   Lab Results  Component Value Date   LDLCALC 167 (H) 11/20/2019   Lab Results  Component Value Date   TRIG 87 11/20/2019   Lab Results  Component Value Date   CHOLHDL 4.6 11/20/2019   Lab Results  Component Value Date   HGBA1C 5.6 11/20/2019      Assessment & Plan:   1. Left leg pain We will initiate pain medication today. He will also practice RICE methods for aid in pain relief.  - ibuprofen (ADVIL) 800 MG tablet; Take 1 tablet (800 mg total) by mouth every 8 (eight) hours as needed.  Dispense:  30 tablet; Refill: 3  2. Essential hypertension The current medical regimen is effective; blood pressure is stable at 137/90 today; continue present plan and medications as prescribed. He will continue to take medications as prescribed, to decrease high sodium intake, excessive alcohol intake, increase potassium intake, smoking cessation, and increase physical activity of at least 30 minutes of cardio activity daily. He will continue to follow Heart Healthy or DASH diet. - POCT urinalysis dipstick - lisinopril (ZESTRIL) 5 MG tablet; Take 1 tablet (5 mg total) by mouth daily.  Dispense: 30 tablet; Refill: 6  3. Dyspnea on exertion - albuterol (VENTOLIN HFA) 108 (90 Base) MCG/ACT inhaler; Inhale 2 puffs into the lungs every 6 (six) hours as needed for wheezing or shortness of breath.  Dispense: 6.7 g; Refill: 6  4. Cough - albuterol (VENTOLIN HFA) 108 (90 Base) MCG/ACT inhaler; Inhale 2 puffs into the lungs every 6 (six) hours as needed for wheezing or shortness of breath.  Dispense: 6.7 g; Refill: 6  5. Mixed hyperlipidemia - atorvastatin (LIPITOR) 10 MG tablet; Take 1 tablet (10 mg total) by mouth daily.  Dispense: 30 tablet; Refill: 6  6. History of multiple allergies - cetirizine (ZYRTEC) 10 MG tablet; Take 1 tablet (10 mg total) by mouth daily.  Dispense: 30 tablet; Refill: 6  7. Lower urinary tract symptoms - tamsulosin (FLOMAX) 0.4 MG CAPS capsule; Take 1 capsule (0.4 mg total) by mouth daily.  Dispense: 30 capsule; Refill: 6  8. Vitamin D deficiency - vitamin E 180 MG (400 UNITS) capsule; Take 1 capsule (400 Units total) by mouth daily.  Dispense: 30 capsule; Refill: 6  9. Follow up He will follow up in 6 months.   Meds ordered this encounter  Medications  . albuterol (VENTOLIN  HFA) 108 (90 Base) MCG/ACT inhaler    Sig: Inhale 2 puffs into the lungs every 6 (six) hours as needed for wheezing or shortness of breath.    Dispense:  6.7 g    Refill:  6  . atorvastatin (LIPITOR) 10  MG tablet    Sig: Take 1 tablet (10 mg total) by mouth daily.    Dispense:  30 tablet    Refill:  6  . cetirizine (ZYRTEC) 10 MG tablet    Sig: Take 1 tablet (10 mg total) by mouth daily.    Dispense:  30 tablet    Refill:  6  . fluticasone (FLONASE) 50 MCG/ACT nasal spray    Sig: Place 2 sprays into both nostrils daily.    Dispense:  9.9 g    Refill:  6  . lisinopril (ZESTRIL) 5 MG tablet    Sig: Take 1 tablet (5 mg total) by mouth daily.    Dispense:  30 tablet    Refill:  6  . tamsulosin (FLOMAX) 0.4 MG CAPS capsule    Sig: Take 1 capsule (0.4 mg total) by mouth daily.    Dispense:  30 capsule    Refill:  6  . vitamin C (ASCORBIC ACID) 250 MG tablet    Sig: Take 1 tablet (250 mg total) by mouth daily.    Dispense:  30 tablet    Refill:  6  . vitamin E 180 MG (400 UNITS) capsule    Sig: Take 1 capsule (400 Units total) by mouth daily.    Dispense:  30 capsule    Refill:  6  . ibuprofen (ADVIL) 800 MG tablet    Sig: Take 1 tablet (800 mg total) by mouth every 8 (eight) hours as needed.    Dispense:  30 tablet    Refill:  3    Orders Placed This Encounter  Procedures  . POCT urinalysis dipstick   Referral Orders  No referral(s) requested today    Raliegh IpNatalie Matis Monnier,  MSN, FNP-BC [facility] [address]  [place], [place] [id number] [phone]  Problem List Items Addressed This Visit      Cardiovascular and Mediastinum   Hypertension   Relevant Medications   atorvastatin (LIPITOR) 10 MG tablet   lisinopril (ZESTRIL) 5 MG tablet   Other Relevant Orders   POCT urinalysis dipstick (Completed)     Other   Cough   Relevant Medications   albuterol (VENTOLIN HFA) 108 (90 Base) MCG/ACT inhaler   Dyspnea on exertion   Relevant Medications   albuterol (VENTOLIN HFA) 108 (90 Base) MCG/ACT inhaler   History of multiple allergies   Relevant Medications   cetirizine (ZYRTEC) 10 MG  tablet   Lower urinary tract symptoms   Relevant Medications   tamsulosin (FLOMAX) 0.4 MG CAPS capsule    Other Visit Diagnoses    Left leg pain    -  Primary   Relevant Medications   ibuprofen (ADVIL) 800 MG tablet   Mixed hyperlipidemia       Relevant Medications   atorvastatin (LIPITOR) 10 MG tablet   lisinopril (ZESTRIL) 5 MG tablet   Vitamin D deficiency       Relevant Medications   vitamin E 180 MG (400 UNITS) capsule   Follow up          Meds ordered this encounter  Medications  . albuterol (VENTOLIN HFA) 108 (90 Base) MCG/ACT inhaler  Sig: Inhale 2 puffs into the lungs every 6 (six) hours as needed for wheezing or shortness of breath.    Dispense:  6.7 g    Refill:  6  . atorvastatin (LIPITOR) 10 MG tablet    Sig: Take 1 tablet (10 mg total) by mouth daily.    Dispense:  30 tablet    Refill:  6  . cetirizine (ZYRTEC) 10 MG tablet    Sig: Take 1 tablet (10 mg total) by mouth daily.    Dispense:  30 tablet    Refill:  6  . fluticasone (FLONASE) 50 MCG/ACT nasal spray    Sig: Place 2 sprays into both nostrils daily.    Dispense:  9.9 g    Refill:  6  . lisinopril (ZESTRIL) 5 MG tablet    Sig: Take 1 tablet (5 mg total) by mouth daily.    Dispense:  30 tablet    Refill:  6  . tamsulosin (FLOMAX) 0.4 MG CAPS capsule    Sig: Take 1 capsule (0.4 mg total) by mouth daily.    Dispense:  30 capsule    Refill:  6  . vitamin C (ASCORBIC ACID) 250 MG tablet    Sig: Take 1 tablet (250 mg total) by mouth daily.    Dispense:  30 tablet    Refill:  6  . vitamin E 180 MG (400 UNITS) capsule    Sig: Take 1 capsule (400 Units total) by mouth daily.    Dispense:  30 capsule    Refill:  6  . ibuprofen (ADVIL) 800 MG tablet    Sig: Take 1 tablet (800 mg total) by mouth every 8 (eight) hours as needed.    Dispense:  30 tablet    Refill:  3    Follow-up: Return in about 6 months (around 07/14/2020).    Kallie Locks, FNP

## 2020-01-12 NOTE — Patient Instructions (Signed)
Muscle Strain A muscle strain is an injury that happens when a muscle is stretched longer than normal. This can happen during a fall, sports, or lifting. This can tear some muscle fibers. Usually, recovery from muscle strain takes 1-2 weeks. Complete healing normally takes 5-6 weeks. This condition is first treated with PRICE therapy. This involves:  Protecting your muscle from being injured again.  Resting your injured muscle.  Icing your injured muscle.  Applying pressure (compression) to your injured muscle. This may be done with a splint or elastic bandage.  Raising (elevating) your injured muscle. Your doctor may also recommend medicine for pain. Follow these instructions at home: If you have a splint:  Wear the splint as told by your doctor. Take it off only as told by your doctor.  Loosen the splint if your fingers or toes tingle, get numb, or turn cold and blue.  Keep the splint clean.  If the splint is not waterproof: ? Do not let it get wet. ? Cover it with a watertight covering when you take a bath or a shower. Managing pain, stiffness, and swelling   If directed, put ice on your injured area. ? If you have a removable splint, take it off as told by your doctor. ? Put ice in a plastic bag. ? Place a towel between your skin and the bag. ? Leave the ice on for 20 minutes, 2-3 times a day.  Move your fingers or toes often. This helps to avoid stiffness and lessen swelling.  Raise your injured area above the level of your heart while you are sitting or lying down.  Wear an elastic bandage as told by your doctor. Make sure it is not too tight. General instructions  Take over-the-counter and prescription medicines only as told by your doctor.  Limit your activity. Rest your injured muscle as told by your doctor. Your doctor may say that gentle movements are okay.  If physical therapy was prescribed, do exercises as told by your doctor.  Do not put pressure on any  part of the splint until it is fully hardened. This may take many hours.  Do not use any products that contain nicotine or tobacco, such as cigarettes and e-cigarettes. These can delay bone healing. If you need help quitting, ask your doctor.  Warm up before you exercise. This helps to prevent more muscle strains.  Ask your doctor when it is safe to drive if you have a splint.  Keep all follow-up visits as told by your doctor. This is important. Contact a doctor if:  You have more pain or swelling in your injured area. Get help right away if:  You have any of these problems in your injured area: ? You have numbness. ? You have tingling. ? You lose a lot of strength. Summary  A muscle strain is an injury that happens when a muscle is stretched longer than normal.  This condition is first treated with PRICE therapy. This includes protecting, resting, icing, adding pressure, and raising your injury.  Limit your activity. Rest your injured muscle as told by your doctor. Your doctor may say that gentle movements are okay.  Warm up before you exercise. This helps to prevent more muscle strains. This information is not intended to replace advice given to you by your health care provider. Make sure you discuss any questions you have with your health care provider. Document Revised: 12/04/2018 Document Reviewed: 11/14/2016 Elsevier Patient Education  [place]. Ibuprofen tablets and  capsules What is this medicine? IBUPROFEN (eye BYOO proe fen) is a non-steroidal anti-inflammatory drug (NSAID). It is used for dental pain, fever, headaches or migraines, osteoarthritis, rheumatoid arthritis, or painful monthly periods. It can also relieve minor aches and pains caused by a cold, flu, or sore throat. This medicine may be used for other purposes; ask your health care provider or pharmacist if you have questions. COMMON BRAND NAME(S): Advil, Advil Junior Strength, Advil Migraine, Genpril,  Ibren, IBU, Ibupak, Midol, Midol Cramps and Body Aches, Motrin, Motrin IB, Motrin Junior Strength, Motrin Migraine Pain, Samson-8, Toxicology Saliva Collection What should I tell my health care provider before I take this medicine? They need to know if you have any of these conditions:  cigarette smoker  coronary artery bypass graft (CABG) surgery within the past 2 weeks  drink more than 3 alcohol-containing drinks a day  heart disease  high blood pressure  history of stomach bleeding  kidney disease  liver disease  lung or breathing disease, like asthma  an unusual or allergic reaction to ibuprofen, aspirin, other NSAIDs, other medicines, foods, dyes, or preservatives  pregnant or trying to get pregnant  breast-feeding How should I use this medicine? Take this medicine by mouth with a glass of water. Follow the directions on the prescription label. Take this medicine with food if your stomach gets upset. Try to not lie down for at least 10 minutes after you take the medicine. Take your medicine at regular intervals. Do not take your medicine more often than directed. A special MedGuide will be given to you by the pharmacist with each prescription and refill. Be sure to read this information carefully each time. Talk to your pediatrician regarding the use of this medicine in children. Special care may be needed. Overdosage: If you think you have taken too much of this medicine contact a poison control center or emergency room at once. NOTE: This medicine is only for you. Do not share this medicine with others. What if I miss a dose? If you miss a dose, take it as soon as you can. If it is almost time for your next dose, take only that dose. Do not take double or extra doses. What may interact with this medicine? Do not take this medicine with any of the following medications:  cidofovir  ketorolac  methotrexate  pemetrexed This medicine may also interact with the  following medications:  alcohol  aspirin  diuretics  lithium  other drugs for inflammation like prednisone  warfarin This list may not describe all possible interactions. Give your health care provider a list of all the medicines, herbs, non-prescription drugs, or dietary supplements you use. Also tell them if you smoke, drink alcohol, or use illegal drugs. Some items may interact with your medicine. What should I watch for while using this medicine? Tell your doctor or healthcare provider if your symptoms do not start to get better or if they get worse. This medicine may cause serious skin reactions. They can happen weeks to months after starting the medicine. Contact your healthcare provider right away if you notice fevers or flu-like symptoms with a rash. The rash may be red or purple and then turn into blisters or peeling of the skin. Or, you might notice a red rash with swelling of the face, lips or lymph nodes in your neck or under your arms. This medicine does not prevent heart attack or stroke. In fact, this medicine may increase the chance of a heart attack  or stroke. The chance may increase with longer use of this medicine and in people who have heart disease. If you take aspirin to prevent heart attack or stroke, talk with your doctor or healthcare provider. Do not take other medicines that contain aspirin, ibuprofen, or naproxen with this medicine. Side effects such as stomach upset, nausea, or ulcers may be more likely to occur. Many medicines available without a prescription should not be taken with this medicine. This medicine can cause ulcers and bleeding in the stomach and intestines at any time during treatment. Ulcers and bleeding can happen without warning symptoms and can cause death. To reduce your risk, do not smoke cigarettes or drink alcohol while you are taking this medicine. You may get drowsy or dizzy. Do not drive, use machinery, or do anything that needs mental  alertness until you know how this medicine affects you. Do not stand or sit up quickly, especially if you are an older patient. This reduces the risk of dizzy or fainting spells. This medicine can cause you to bleed more easily. Try to avoid damage to your teeth and gums when you brush or floss your teeth. This medicine may be used to treat migraines. If you take migraine medicines for 10 or more days a month, your migraines may get worse. Keep a diary of headache days and medicine use. Contact your healthcare provider if your migraine attacks occur more frequently. What side effects may I notice from receiving this medicine? Side effects that you should report to your doctor or health care professional as soon as possible:  allergic reactions like skin rash, itching or hives, swelling of the face, lips, or tongue  redness, blistering, peeling or loosening of the skin, including inside the mouth  severe stomach pain  signs and symptoms of bleeding such as bloody or black, tarry stools; red or dark-brown urine; spitting up blood or brown material that looks like coffee grounds; red spots on the skin; unusual bruising or bleeding from the eye, gums, or nose  signs and symptoms of a blood clot such as changes in vision; chest pain; severe, sudden headache; trouble speaking; sudden numbness or weakness of the face, arm, or leg  unexplained weight gain or swelling  unusually weak or tired  yellowing of eyes or skin Side effects that usually do not require medical attention (report to your doctor or health care professional if they continue or are bothersome):  bruising  diarrhea  dizziness, drowsiness  headache  nausea, vomiting This list may not describe all possible side effects. Call your doctor for medical advice about side effects. You may report side effects to FDA at 1-800-FDA-1088. Where should I keep my medicine? Keep out of the reach of children. Store at room temperature  between 15 and 30 degrees C (59 and 86 degrees F). Keep container tightly closed. Throw away any unused medicine after the expiration date. NOTE: This sheet is a summary. It may not cover all possible information. If you have questions about this medicine, talk to your doctor, pharmacist, or health care provider.  2020 Elsevier/Gold Standard (2018-12-24 14:11:00)

## 2020-01-23 ENCOUNTER — Ambulatory Visit: Payer: Medicaid Other

## 2020-02-16 ENCOUNTER — Ambulatory Visit: Payer: Medicaid Other

## 2020-02-17 MED FILL — ATORVASTATIN 10 MG TABLET: 10 | 30 days supply | Qty: 30 | Fill #1

## 2020-02-17 MED FILL — FLUTICASONE PROP 50 MCG SPR: 50 | 30 days supply | Qty: 16 | Fill #1

## 2020-03-22 DIAGNOSIS — A048 Other specified bacterial intestinal infections: Secondary | ICD-10-CM

## 2020-03-22 HISTORY — DX: Other specified bacterial intestinal infections: A04.8

## 2020-03-29 ENCOUNTER — Other Ambulatory Visit: Payer: Self-pay | Admitting: Family Medicine

## 2020-03-29 DIAGNOSIS — M79605 Pain in left leg: Secondary | ICD-10-CM

## 2020-04-04 MED FILL — LISINOPRIL 5 MG TABLET: 5 | 90 days supply | Qty: 90 | Fill #1

## 2020-04-13 ENCOUNTER — Encounter: Payer: Self-pay | Admitting: Family Medicine

## 2020-04-13 ENCOUNTER — Ambulatory Visit (INDEPENDENT_AMBULATORY_CARE_PROVIDER_SITE_OTHER): Payer: Medicaid Other | Admitting: Family Medicine

## 2020-04-13 ENCOUNTER — Other Ambulatory Visit: Payer: Self-pay

## 2020-04-13 ENCOUNTER — Other Ambulatory Visit: Payer: Self-pay | Admitting: Family Medicine

## 2020-04-13 VITALS — BP 130/86 | HR 69 | Temp 98.3°F | Ht 63.0 in | Wt 179.0 lb

## 2020-04-13 DIAGNOSIS — Z09 Encounter for follow-up examination after completed treatment for conditions other than malignant neoplasm: Secondary | ICD-10-CM | POA: Diagnosis not present

## 2020-04-13 DIAGNOSIS — K219 Gastro-esophageal reflux disease without esophagitis: Secondary | ICD-10-CM

## 2020-04-13 DIAGNOSIS — I10 Essential (primary) hypertension: Secondary | ICD-10-CM | POA: Diagnosis not present

## 2020-04-13 DIAGNOSIS — Z9189 Other specified personal risk factors, not elsewhere classified: Secondary | ICD-10-CM | POA: Diagnosis not present

## 2020-04-13 DIAGNOSIS — Z889 Allergy status to unspecified drugs, medicaments and biological substances status: Secondary | ICD-10-CM

## 2020-04-13 LAB — POCT URINALYSIS DIPSTICK
Bilirubin, UA: NEGATIVE
Blood, UA: NEGATIVE
Glucose, UA: NEGATIVE
Ketones, UA: NEGATIVE
Leukocytes, UA: NEGATIVE
Nitrite, UA: NEGATIVE
Protein, UA: NEGATIVE
Spec Grav, UA: 1.02 (ref 1.010–1.025)
Urobilinogen, UA: 0.2 E.U./dL
pH, UA: 5.5 (ref 5.0–8.0)

## 2020-04-13 MED ORDER — LISINOPRIL 5 MG PO TABS: 5.0000 mg | ORAL_TABLET | Freq: Every day | ORAL | 11 refills | Status: DC

## 2020-04-13 MED ORDER — CETIRIZINE HCL 10 MG PO TABS: 10.0000 mg | ORAL_TABLET | Freq: Every day | ORAL | 11 refills | Status: DC

## 2020-04-13 MED ORDER — FLUTICASONE PROPIONATE 50 MCG/ACT NA SUSP: 2.0000 | Freq: Every day | NASAL | 11 refills | Status: DC

## 2020-04-13 MED FILL — CETIRIZINE HCL 10 MG TABS: 10 | 90 days supply | Qty: 90 | Fill #0

## 2020-04-13 MED FILL — LISINOPRIL 5 MG TABLET: 5 | 90 days supply | Qty: 90 | Fill #0

## 2020-04-13 NOTE — Progress Notes (Signed)
Patient [facility]   Established Patient Office Visit  Subjective:  Patient ID: Manuel Brooks, male    DOB: [birth year]  Age: 59 y.o. MRN: [id number]  CC:  Chief Complaint  Patient presents with  . Follow-up    Pain and burning in stomach; take OTC heartburn relief and Nexium     HPI Manuel Brooks is a 59 year old male who presents for Follow Up today.    Patient Active Problem List   Diagnosis Date Noted  . Left leg pain 01/12/2020  . Mixed hyperlipidemia 01/12/2020  . Coronavirus infection 10/28/2019  . History of multiple allergies 07/28/2019  . Lower urinary tract symptoms 07/28/2019  . Dyspnea on exertion 05/12/2019  . History of pneumonia 01/21/2019  . Gastroesophageal reflux disease 01/21/2019  . Acute otitis media 12/23/2018  . Cough 12/23/2018  . Hypertension 05/20/2018  . Right shoulder pain 09/03/2014  . Traumatic tear of supraspinatus tendon of left shoulder 07/30/2014  . Pain, dental 08/06/2013  . Penile bleeding 08/06/2013  . Leukocytopenia, unspecified 09/28/2011   Current Status: Since his last office visit, he is doing well with no complaints. He has c/o abdominal pain after he goes to bed. He admits to eating bread and drinking a glass of milk before he goes to bed. He has not taken any medication for relief of symptoms. Denies GI problems such as nausea, vomiting, diarrhea, and constipation. He has no reports of blood in stools, dysuria and hematuria.He denies fevers, chills, fatigue, recent infections, weight loss, and night sweats. He has not had any headaches, visual changes, dizziness, and falls. No chest pain, heart palpitations, cough and shortness of breath reported.  No depression or anxiety, and denies suicidal ideations, homicidal ideations, or auditory hallucinations. He is taking all medications as prescribed. He denies pain today. He states that he will be traveling back to his country, [place] in a few months.     Past Medical History:  Diagnosis Date  . Allergy   . CAP (community acquired pneumonia) 01/2019  . Elevated liver enzymes 10/2019  . Exertional shortness of breath 04/2019  . GERD (gastroesophageal reflux disease)   . Hyperlipidemia 10/2019  . Hypertension   . Leucopenia   . Vitamin D deficiency 11/2019    Past Surgical History:  Procedure Laterality Date  . BIOPSY BONE MARROW  09/21/2011      . OTHER SURGICAL HISTORY     was unable to have childern and had surgery so he could have children 1990    No family history on file.  Social History   Socioeconomic History  . Marital status: Married    Spouse name: Not on file  . Number of children: Not on file  . Years of education: Not on file  . Highest education level: Not on file  Occupational History  . Not on file  Tobacco Use  . Smoking status: Former Smoker    Packs/day: 0.50    Types: Cigarettes    Start date: 10/22/1988    Quit date: 10/22/1998    Years since quitting: 21.4  . Smokeless tobacco: Never Used  Vaping Use  . Vaping Use: Never used  Substance and Sexual Activity  . Alcohol use: No  . Drug use: No    Frequency: 1.0 times per week  . Sexual activity: Yes  Other Topics Concern  . Not on file  Social History Narrative  . Not on file   Social Determinants  of Health   Financial Resource Strain:   . Difficulty of Paying Living Expenses:   Food Insecurity:   . Worried About Programme researcher, broadcasting/film/video in the Last Year:   . Barista in the Last Year:   Transportation Needs:   . Freight forwarder (Medical):   Marland Kitchen Lack of Transportation (Non-Medical):   Physical Activity:   . Days of Exercise per Week:   . Minutes of Exercise per Session:   Stress:   . Feeling of Stress :   Social Connections:   . Frequency of Communication with Friends and Family:   . Frequency of Social Gatherings with Friends and Family:   . Attends Religious Services:   . Active Member of Clubs or Organizations:   .  Attends Banker Meetings:   Marland Kitchen Marital Status:   Intimate Partner Violence:   . Fear of Current or Ex-Partner:   . Emotionally Abused:   Marland Kitchen Physically Abused:   . Sexually Abused:     Outpatient Medications Prior to Visit  Medication Sig Dispense Refill  . albuterol (VENTOLIN HFA) 108 (90 Base) MCG/ACT inhaler Inhale 2 puffs into the lungs every 6 (six) hours as needed for wheezing or shortness of breath. 6.7 g 6  . atorvastatin (LIPITOR) 10 MG tablet Take 1 tablet (10 mg total) by mouth daily. 30 tablet 6  . ibuprofen (ADVIL) 800 MG tablet TAKE 1 TABLET (800 MG TOTAL) BY MOUTH EVERY 8 (EIGHT) HOURS AS NEEDED. 120 tablet 6  . nitroGLYCERIN (NITRODUR - DOSED IN MG/24 HR) 0.2 mg/hr patch Apply 1/4 patch to the affected area every 24 hours 30 patch 11  . tamsulosin (FLOMAX) 0.4 MG CAPS capsule Take 1 capsule (0.4 mg total) by mouth daily. 30 capsule 6  . vitamin C (ASCORBIC ACID) 250 MG tablet Take 1 tablet (250 mg total) by mouth daily. 30 tablet 6  . vitamin E 180 MG (400 UNITS) capsule Take 1 capsule (400 Units total) by mouth daily. 30 capsule 6  . Zinc 100 MG TABS Take 100 mg by mouth daily.    . cetirizine (ZYRTEC) 10 MG tablet Take 1 tablet (10 mg total) by mouth daily. 30 tablet 6  . fluticasone (FLONASE) 50 MCG/ACT nasal spray Place 2 sprays into both nostrils daily. 9.9 g 6  . lisinopril (ZESTRIL) 5 MG tablet Take 1 tablet (5 mg total) by mouth daily. 30 tablet 6  . hydrocortisone (ANUSOL-HC) 2.5 % rectal cream Place 1 application rectally 2 (two) times daily. (Patient not taking: Reported on 04/13/2020) 30 g 3  . omeprazole (PRILOSEC) 20 MG capsule Take 1 capsule (20 mg total) by mouth daily. (Patient not taking: Reported on 01/12/2020) 30 capsule 6   No facility-administered medications prior to visit.    No Known Allergies  ROS Review of Systems  Constitutional: Negative.   HENT: Negative.   Eyes: Negative.   Respiratory: Negative.   Cardiovascular: Negative.     Gastrointestinal: Positive for abdominal pain (acid reflux).  Endocrine: Negative.   Genitourinary: Negative.   Musculoskeletal: Negative.   Skin: Negative.   Allergic/Immunologic: Negative.   Neurological: Positive for dizziness (occasional ).  Hematological: Negative.   Psychiatric/Behavioral: Negative.       Objective:    Physical Exam Vitals and nursing note reviewed. Chaperone present: Surveyor, minerals.   Constitutional:      Appearance: Normal appearance.  Musculoskeletal:     Cervical back: Normal range of motion and neck supple.  Neurological:     Mental Status: He is alert.     BP 130/86 (BP Location: Left Arm, Patient Position: Sitting, Cuff Size: Small)   Pulse 69   Temp 98.3 F (36.8 C)   Ht 5\' 3"  (1.6 m)   Wt 179 lb 0.2 oz (81.2 kg)   SpO2 98%   BMI 31.71 kg/m  Wt Readings from Last 3 Encounters:  04/13/20 179 lb 0.2 oz (81.2 kg)  01/12/20 175 lb 3.2 oz (79.5 kg)  11/20/19 171 lb 6.4 oz (77.7 kg)     Health Maintenance Due  Topic Date Due  . COVID-19 Vaccine (1) Never done  . COLONOSCOPY  Never done    There are no preventive care reminders to display for this patient.  Lab Results  Component Value Date   TSH 0.715 11/20/2019   Lab Results  Component Value Date   WBC 4.1 11/20/2019   HGB 14.1 11/20/2019   HCT 41.4 11/20/2019   MCV 81 11/20/2019   PLT 269 11/20/2019   Lab Results  Component Value Date   NA 137 11/20/2019   K 3.9 11/20/2019   CO2 24 11/20/2019   GLUCOSE 84 11/20/2019   BUN 13 11/20/2019   CREATININE 0.91 11/20/2019   BILITOT 0.4 11/20/2019   ALKPHOS 70 11/20/2019   AST 50 (H) 11/20/2019   ALT 71 (H) 11/20/2019   PROT 7.3 11/20/2019   ALBUMIN 4.3 11/20/2019   CALCIUM 9.3 11/20/2019   ANIONGAP 12 10/16/2019   Lab Results  Component Value Date   CHOL 233 (H) 11/20/2019   Lab Results  Component Value Date   HDL 51 11/20/2019   Lab Results  Component Value Date   LDLCALC 167 (H) 11/20/2019   Lab  Results  Component Value Date   TRIG 87 11/20/2019   Lab Results  Component Value Date   CHOLHDL 4.6 11/20/2019   Lab Results  Component Value Date   HGBA1C 5.6 11/20/2019      Assessment & Plan:   1. Essential hypertension The current medical regimen is effective; blood pressure is stable at 130/86 today; continue present plan and medications as prescribed. He will continue to take medications as prescribed, to decrease high sodium intake, excessive alcohol intake, increase potassium intake, smoking cessation, and increase physical activity of at least 30 minutes of cardio activity daily. He will continue to follow Heart Healthy or DASH diet. - Urinalysis Dipstick - lisinopril (ZESTRIL) 5 MG tablet; Take 1 tablet (5 mg total) by mouth daily.  Dispense: 30 tablet; Refill: 11  2. Gastroesophageal reflux disease without esophagitis - H. pylori breath test  3. History of multiple allergies - fluticasone (FLONASE) 50 MCG/ACT nasal spray; Place 2 sprays into both nostrils daily.  Dispense: 9.9 g; Refill: 11 - cetirizine (ZYRTEC) 10 MG tablet; Take 1 tablet (10 mg total) by mouth daily.  Dispense: 30 tablet; Refill: 11  4. Follow up He will follow up in 6 months.  Meds ordered this encounter  Medications  . fluticasone (FLONASE) 50 MCG/ACT nasal spray    Sig: Place 2 sprays into both nostrils daily.    Dispense:  9.9 g    Refill:  11  . cetirizine (ZYRTEC) 10 MG tablet    Sig: Take 1 tablet (10 mg total) by mouth daily.    Dispense:  30 tablet    Refill:  11  . lisinopril (ZESTRIL) 5 MG tablet    Sig: Take 1 tablet (5 mg total) by mouth daily.  Dispense:  30 tablet    Refill:  11    Orders Placed This Encounter  Procedures  . H. pylori breath test  . Urinalysis Dipstick    Referral Orders  No referral(s) requested today    Raliegh Ip,  MSN, FNP-BC [facility] [address]  [place], [place] [address] [phone] fax    Problem List Items Addressed This Visit      Cardiovascular and Mediastinum   Hypertension - Primary   Relevant Medications   lisinopril (ZESTRIL) 5 MG tablet   Other Relevant Orders   Urinalysis Dipstick (Completed)     Digestive   Gastroesophageal reflux disease   Relevant Orders   H. pylori breath test     Other   History of multiple allergies   Relevant Medications   fluticasone (FLONASE) 50 MCG/ACT nasal spray   cetirizine (ZYRTEC) 10 MG tablet    Other Visit Diagnoses    Follow up          Meds ordered this encounter  Medications  . fluticasone (FLONASE) 50 MCG/ACT nasal spray    Sig: Place 2 sprays into both nostrils daily.    Dispense:  9.9 g    Refill:  11  . cetirizine (ZYRTEC) 10 MG tablet    Sig: Take 1 tablet (10 mg total) by mouth daily.    Dispense:  30 tablet    Refill:  11  . lisinopril (ZESTRIL) 5 MG tablet    Sig: Take 1 tablet (5 mg total) by mouth daily.    Dispense:  30 tablet    Refill:  11    Follow-up: Return in about 6 months (around 10/13/2020).    Kallie Locks, FNP

## 2020-04-15 ENCOUNTER — Encounter: Payer: Self-pay | Admitting: Family Medicine

## 2020-04-15 ENCOUNTER — Other Ambulatory Visit: Payer: Self-pay | Admitting: Family Medicine

## 2020-04-15 DIAGNOSIS — K279 Peptic ulcer, site unspecified, unspecified as acute or chronic, without hemorrhage or perforation: Secondary | ICD-10-CM

## 2020-04-15 DIAGNOSIS — B9681 Helicobacter pylori [H. pylori] as the cause of diseases classified elsewhere: Secondary | ICD-10-CM

## 2020-04-15 LAB — H. PYLORI BREATH TEST: H pylori Breath Test: POSITIVE — AB

## 2020-04-15 MED ORDER — OMEPRAZOLE 40 MG PO CPDR: 40.0000 mg | DELAYED_RELEASE_CAPSULE | Freq: Every day | ORAL | 0 refills | Status: DC

## 2020-04-15 MED ORDER — AMOXICILLIN 500 MG PO TABS: ORAL_TABLET | ORAL | 0 refills | Status: DC

## 2020-04-15 MED ORDER — METRONIDAZOLE 500 MG PO TABS: 500.0000 mg | ORAL_TABLET | Freq: Two times a day (BID) | ORAL | 0 refills | Status: AC

## 2020-04-18 MED FILL — metroNIDAZOLE 500 MG TABS: 500 | 14 days supply | Qty: 28 | Fill #0

## 2020-04-18 MED FILL — OMEPRAZOLE DR 40 MG CAPSULE: 40 | 14 days supply | Qty: 14 | Fill #0

## 2020-04-18 MED FILL — AMOXICILLIN 500 MG CAPSULE: 500 | 14 days supply | Qty: 56 | Fill #0

## 2020-04-18 NOTE — Progress Notes (Signed)
Patient notified of results, no additional questions. 

## 2020-04-20 ENCOUNTER — Ambulatory Visit: Payer: Medicaid Other | Admitting: Family Medicine

## 2020-04-29 ENCOUNTER — Other Ambulatory Visit: Payer: Self-pay

## 2020-04-29 ENCOUNTER — Encounter: Payer: Self-pay | Admitting: Family Medicine

## 2020-04-29 ENCOUNTER — Ambulatory Visit (INDEPENDENT_AMBULATORY_CARE_PROVIDER_SITE_OTHER): Payer: Medicaid Other | Admitting: Family Medicine

## 2020-04-29 VITALS — BP 124/58 | HR 65 | Temp 98.1°F | Resp 16 | Ht 63.0 in | Wt 179.0 lb

## 2020-04-29 DIAGNOSIS — E782 Mixed hyperlipidemia: Secondary | ICD-10-CM

## 2020-04-29 DIAGNOSIS — Z9189 Other specified personal risk factors, not elsewhere classified: Secondary | ICD-10-CM

## 2020-04-29 DIAGNOSIS — R0609 Other forms of dyspnea: Secondary | ICD-10-CM

## 2020-04-29 DIAGNOSIS — K219 Gastro-esophageal reflux disease without esophagitis: Secondary | ICD-10-CM | POA: Diagnosis not present

## 2020-04-29 DIAGNOSIS — I10 Essential (primary) hypertension: Secondary | ICD-10-CM

## 2020-04-29 DIAGNOSIS — R059 Cough, unspecified: Secondary | ICD-10-CM

## 2020-04-29 DIAGNOSIS — R0600 Dyspnea, unspecified: Secondary | ICD-10-CM

## 2020-04-29 DIAGNOSIS — R399 Unspecified symptoms and signs involving the genitourinary system: Secondary | ICD-10-CM

## 2020-04-29 DIAGNOSIS — R05 Cough: Secondary | ICD-10-CM

## 2020-04-29 DIAGNOSIS — Z889 Allergy status to unspecified drugs, medicaments and biological substances status: Secondary | ICD-10-CM

## 2020-04-29 DIAGNOSIS — Z09 Encounter for follow-up examination after completed treatment for conditions other than malignant neoplasm: Secondary | ICD-10-CM | POA: Diagnosis not present

## 2020-04-29 LAB — POCT URINALYSIS DIPSTICK
Bilirubin, UA: NEGATIVE
Blood, UA: NEGATIVE
Glucose, UA: NEGATIVE
Ketones, UA: NEGATIVE
Leukocytes, UA: NEGATIVE
Nitrite, UA: NEGATIVE
Protein, UA: NEGATIVE
Spec Grav, UA: 1.025 (ref 1.010–1.025)
Urobilinogen, UA: 0.2 E.U./dL
pH, UA: 5.5 (ref 5.0–8.0)

## 2020-04-29 MED ORDER — ALBUTEROL SULFATE HFA 108 (90 BASE) MCG/ACT IN AERS: 2.0000 | INHALATION_SPRAY | Freq: Four times a day (QID) | RESPIRATORY_TRACT | 6 refills | Status: DC | PRN

## 2020-04-29 MED ORDER — FLUTICASONE PROPIONATE 50 MCG/ACT NA SUSP: 2.0000 | Freq: Every day | NASAL | 11 refills | Status: DC

## 2020-04-29 MED ORDER — LISINOPRIL 5 MG PO TABS: 5.0000 mg | ORAL_TABLET | Freq: Every day | ORAL | 11 refills | Status: DC

## 2020-04-29 MED ORDER — ATORVASTATIN CALCIUM 10 MG PO TABS: 10.0000 mg | ORAL_TABLET | Freq: Every day | ORAL | 6 refills | Status: DC

## 2020-04-29 MED ORDER — CETIRIZINE HCL 10 MG PO TABS: 10.0000 mg | ORAL_TABLET | Freq: Every day | ORAL | 11 refills | Status: DC

## 2020-04-29 MED ORDER — TAMSULOSIN HCL 0.4 MG PO CAPS: 0.4000 mg | ORAL_CAPSULE | Freq: Every day | ORAL | 6 refills | Status: DC

## 2020-04-29 NOTE — Progress Notes (Signed)
[facility]   Established Patient Office Visit  Subjective:  Patient ID: Manuel Brooks, male    DOB: 26-Aug-1961  Age: 59 y.o. MRN: [id number]  CC:  Chief Complaint  Patient presents with  . Follow-up    Pt states he is for refill on medications. Pt states he is going out of town Monday morning and staying 1-2 months.    HPI Manuel Brooks is a 59 year old male who presents for Follow Up today.   Patient Active Problem List   Diagnosis Date Noted  . Left leg pain 01/12/2020  . Mixed hyperlipidemia 01/12/2020  . Coronavirus infection 10/28/2019  . History of multiple allergies 07/28/2019  . Lower urinary tract symptoms 07/28/2019  . Dyspnea on exertion 05/12/2019  . History of pneumonia 01/21/2019  . Gastroesophageal reflux disease 01/21/2019  . Acute otitis media 12/23/2018  . Cough 12/23/2018  . Hypertension 05/20/2018  . Right shoulder pain 09/03/2014  . Traumatic tear of supraspinatus tendon of left shoulder 07/30/2014  . Pain, dental 08/06/2013  . Penile bleeding 08/06/2013  . Leukocytopenia, unspecified 09/28/2011    Past Medical History:  Diagnosis Date  . Allergy   . CAP (community acquired pneumonia) 01/2019  . Elevated liver enzymes 10/2019  . Exertional shortness of breath 04/2019  . GERD (gastroesophageal reflux disease)   . H. pylori infection 03/2020  . Hyperlipidemia 10/2019  . Hypertension   . Leucopenia   . Vitamin D deficiency 11/2019   Current Status: Since his last office visit, he is doing well with no complaints. He is planning to travel to his [place] on Monday.  He denies fevers, chills, fatigue, recent infections, weight loss, and night sweats. He has not had any headaches, visual changes, dizziness, and falls. No chest pain, heart palpitations, cough and shortness of breath reported. Denies GI problems such as nausea, vomiting, diarrhea, and constipation. He has no reports of blood  in stools, dysuria and hematuria. No depression or anxiety, and denies suicidal ideations, homicidal ideations, or auditory hallucinations. He is taking all medications as prescribed. He denies pain today.   Past Surgical History:  Procedure Laterality Date  . BIOPSY BONE MARROW  09/21/2011      . OTHER SURGICAL HISTORY     was unable to have childern and had surgery so he could have children 1990    No family history on file.  Social History   Socioeconomic History  . Marital status: Married    Spouse name: Not on file  . Number of children: Not on file  . Years of education: Not on file  . Highest education level: Not on file  Occupational History  . Not on file  Tobacco Use  . Smoking status: Former Smoker    Packs/day: 0.50    Types: Cigarettes    Start date: 10/22/1988    Quit date: 10/22/1998    Years since quitting: 21.5  . Smokeless tobacco: Never Used  Vaping Use  . Vaping Use: Never used  Substance and Sexual Activity  . Alcohol use: No  . Drug use: No    Frequency: 1.0 times per week  . Sexual activity: Yes  Other Topics Concern  . Not on file  Social History Narrative  . Not on file   Social Determinants of Health   Financial Resource Strain:   . Difficulty of Paying Living Expenses:   Food Insecurity:   . Worried About Running  Out of Food in the Last Year:   . Ran Out of Food in the Last Year:   Transportation Needs:   . Lack of Transportation (Medical):   Marland Kitchen Lack of Transportation (Non-Medical):   Physical Activity:   . Days of Exercise per Week:   . Minutes of Exercise per Session:   Stress:   . Feeling of Stress :   Social Connections:   . Frequency of Communication with Friends and Family:   . Frequency of Social Gatherings with Friends and Family:   . Attends Religious Services:   . Active Member of Clubs or Organizations:   . Attends Banker Meetings:   Marland Kitchen Marital Status:   Intimate Partner Violence:   . Fear of Current or  Ex-Partner:   . Emotionally Abused:   Marland Kitchen Physically Abused:   . Sexually Abused:     Outpatient Medications Prior to Visit  Medication Sig Dispense Refill  . albuterol (VENTOLIN HFA) 108 (90 Base) MCG/ACT inhaler Inhale 2 puffs into the lungs every 6 (six) hours as needed for wheezing or shortness of breath. 6.7 g 6  . amoxicillin (AMOXIL) 500 MG tablet Take 2 capsules (1,000 mg= total), by mouth, 2 times a day X 14 days. 28 tablet 0  . atorvastatin (LIPITOR) 10 MG tablet Take 1 tablet (10 mg total) by mouth daily. 30 tablet 6  . cetirizine (ZYRTEC) 10 MG tablet Take 1 tablet (10 mg total) by mouth daily. 30 tablet 11  . fluticasone (FLONASE) 50 MCG/ACT nasal spray Place 2 sprays into both nostrils daily. 9.9 g 11  . hydrocortisone (ANUSOL-HC) 2.5 % rectal cream Place 1 application rectally 2 (two) times daily. 30 g 3  . ibuprofen (ADVIL) 800 MG tablet TAKE 1 TABLET (800 MG TOTAL) BY MOUTH EVERY 8 (EIGHT) HOURS AS NEEDED. 120 tablet 6  . lisinopril (ZESTRIL) 5 MG tablet Take 1 tablet (5 mg total) by mouth daily. 30 tablet 11  . metroNIDAZOLE (FLAGYL) 500 MG tablet Take 1 tablet (500 mg total) by mouth 2 (two) times daily for 14 days. 28 tablet 0  . nitroGLYCERIN (NITRODUR - DOSED IN MG/24 HR) 0.2 mg/hr patch Apply 1/4 patch to the affected area every 24 hours 30 patch 11  . omeprazole (PRILOSEC) 40 MG capsule Take 1 capsule (40 mg total) by mouth daily for 14 days. 14 capsule 0  . tamsulosin (FLOMAX) 0.4 MG CAPS capsule Take 1 capsule (0.4 mg total) by mouth daily. 30 capsule 6  . vitamin C (ASCORBIC ACID) 250 MG tablet Take 1 tablet (250 mg total) by mouth daily. 30 tablet 6  . vitamin E 180 MG (400 UNITS) capsule Take 1 capsule (400 Units total) by mouth daily. 30 capsule 6  . Zinc 100 MG TABS Take 100 mg by mouth daily.     No facility-administered medications prior to visit.    No Known Allergies  ROS Review of Systems  Constitutional: Negative.   HENT: Negative.   Eyes: Negative.    Respiratory: Positive for shortness of breath (occasional ).   Cardiovascular: Negative.   Gastrointestinal: Negative.   Endocrine: Positive for polyuria.  Genitourinary: Negative.   Musculoskeletal: Positive for arthralgias (generalized joint pani).  Skin: Negative.   Allergic/Immunologic: Negative.   Neurological: Positive for dizziness (occasional ) and headaches (occasional ).  Hematological: Negative.   Psychiatric/Behavioral: Negative.       Objective:    Physical Exam Vitals and nursing note reviewed.  Constitutional:  Appearance: Normal appearance.  HENT:     Head: Normocephalic and atraumatic.     Nose: Nose normal.     Mouth/Throat:     Mouth: Mucous membranes are moist.     Pharynx: Oropharynx is clear.  Cardiovascular:     Rate and Rhythm: Normal rate and regular rhythm.     Pulses: Normal pulses.     Heart sounds: Normal heart sounds.  Pulmonary:     Effort: Pulmonary effort is normal.     Breath sounds: Normal breath sounds.  Abdominal:     General: Bowel sounds are normal. There is distension (obesity).     Palpations: Abdomen is soft.  Musculoskeletal:        General: Normal range of motion.     Cervical back: Normal range of motion and neck supple.  Skin:    General: Skin is warm and dry.  Neurological:     General: No focal deficit present.     Mental Status: He is alert and oriented to person, place, and time.  Psychiatric:        Mood and Affect: Mood normal.        Thought Content: Thought content normal.        Judgment: Judgment normal.     BP (!) 124/58 (BP Location: Left Arm, Patient Position: Sitting, Cuff Size: Normal)   Pulse 65   Temp 98.1 F (36.7 C)   Resp 16   Ht 5\' 3"  (1.6 m)   Wt 179 lb (81.2 kg)   SpO2 100%   BMI 31.71 kg/m  Wt Readings from Last 3 Encounters:  04/29/20 179 lb (81.2 kg)  04/13/20 179 lb 0.2 oz (81.2 kg)  01/12/20 175 lb 3.2 oz (79.5 kg)     Health Maintenance Due  Topic Date Due  .  COVID-19 Vaccine (1) Never done  . COLONOSCOPY  Never done    There are no preventive care reminders to display for this patient.  Lab Results  Component Value Date   TSH 0.715 11/20/2019   Lab Results  Component Value Date   WBC 4.1 11/20/2019   HGB 14.1 11/20/2019   HCT 41.4 11/20/2019   MCV 81 11/20/2019   PLT 269 11/20/2019   Lab Results  Component Value Date   NA 137 11/20/2019   K 3.9 11/20/2019   CO2 24 11/20/2019   GLUCOSE 84 11/20/2019   BUN 13 11/20/2019   CREATININE 0.91 11/20/2019   BILITOT 0.4 11/20/2019   ALKPHOS 70 11/20/2019   AST 50 (H) 11/20/2019   ALT 71 (H) 11/20/2019   PROT 7.3 11/20/2019   ALBUMIN 4.3 11/20/2019   CALCIUM 9.3 11/20/2019   ANIONGAP 12 10/16/2019   Lab Results  Component Value Date   CHOL 233 (H) 11/20/2019   Lab Results  Component Value Date   HDL 51 11/20/2019   Lab Results  Component Value Date   LDLCALC 167 (H) 11/20/2019   Lab Results  Component Value Date   TRIG 87 11/20/2019   Lab Results  Component Value Date   CHOLHDL 4.6 11/20/2019   Lab Results  Component Value Date   HGBA1C 5.6 11/20/2019      Assessment & Plan:   1. Essential hypertension He denies visual changes, chest pain, cough, shortness of breath, heart palpitations, and falls. He has occasional headaches and dizziness with position changes. Denies severe headaches, confusion, seizures, double vision, and blurred vision, nausea and vomiting. He will continue to take medications as  prescribed, to decrease high sodium intake, excessive alcohol intake, increase potassium intake, smoking cessation, and increase physical activity of at least 30 minutes of cardio activity daily. He will continue to follow Heart Healthy or DASH diet.  2. Gastroesophageal reflux disease without esophagitis Recently positive for H. Pylori. He has completed antibiotics and states that he feels better.   3. Follow up He will keep follow up appointment.   No orders of  the defined types were placed in this encounter.   No orders of the defined types were placed in this encounter.   Referral Orders  No referral(s) requested today    Raliegh Ip,  MSN, FNP-BC [facility]/Internal Medicine/[facility] Cell Center [facility] [address] [place], [place] [address] [phone] fax  Problem List Items Addressed This Visit      Cardiovascular and Mediastinum   Hypertension - Primary     Digestive   Gastroesophageal reflux disease    Other Visit Diagnoses    Follow up          No orders of the defined types were placed in this encounter.   Follow-up: No follow-ups on file.    Kallie Locks, FNP

## 2020-05-18 ENCOUNTER — Ambulatory Visit: Payer: Medicaid Other | Admitting: Family Medicine

## 2020-07-11 DIAGNOSIS — Z20822 Contact with and (suspected) exposure to covid-19: Secondary | ICD-10-CM | POA: Diagnosis not present

## 2020-07-11 MED FILL — LISINOPRIL 5 MG TABLET: 5 | 90 days supply | Qty: 90 | Fill #1

## 2020-07-11 MED FILL — ATORVASTATIN 10 MG TABLET: 10 | 60 days supply | Qty: 60 | Fill #3

## 2020-09-12 ENCOUNTER — Encounter: Payer: Self-pay | Admitting: Family Medicine

## 2020-09-12 ENCOUNTER — Other Ambulatory Visit: Payer: Self-pay

## 2020-09-12 ENCOUNTER — Ambulatory Visit (INDEPENDENT_AMBULATORY_CARE_PROVIDER_SITE_OTHER): Payer: Medicaid Other | Admitting: Family Medicine

## 2020-09-12 VITALS — BP 129/84 | HR 69 | Temp 98.3°F | Resp 16 | Ht 63.0 in | Wt 186.3 lb

## 2020-09-12 DIAGNOSIS — I10 Essential (primary) hypertension: Secondary | ICD-10-CM

## 2020-09-12 DIAGNOSIS — R059 Cough, unspecified: Secondary | ICD-10-CM

## 2020-09-12 DIAGNOSIS — B349 Viral infection, unspecified: Secondary | ICD-10-CM | POA: Diagnosis not present

## 2020-09-12 DIAGNOSIS — Z9189 Other specified personal risk factors, not elsewhere classified: Secondary | ICD-10-CM | POA: Diagnosis not present

## 2020-09-12 DIAGNOSIS — Z23 Encounter for immunization: Secondary | ICD-10-CM | POA: Diagnosis not present

## 2020-09-12 DIAGNOSIS — Z0000 Encounter for general adult medical examination without abnormal findings: Secondary | ICD-10-CM

## 2020-09-12 DIAGNOSIS — Z889 Allergy status to unspecified drugs, medicaments and biological substances status: Secondary | ICD-10-CM

## 2020-09-12 DIAGNOSIS — Z09 Encounter for follow-up examination after completed treatment for conditions other than malignant neoplasm: Secondary | ICD-10-CM

## 2020-09-12 LAB — POCT URINALYSIS DIPSTICK
Bilirubin, UA: NEGATIVE
Blood, UA: NEGATIVE
Glucose, UA: NEGATIVE
Ketones, UA: NEGATIVE
Leukocytes, UA: NEGATIVE
Nitrite, UA: NEGATIVE
Protein, UA: NEGATIVE
Spec Grav, UA: 1.025 (ref 1.010–1.025)
Urobilinogen, UA: 0.2 E.U./dL
pH, UA: 5.5 (ref 5.0–8.0)

## 2020-09-12 MED ORDER — CETIRIZINE HCL 10 MG PO TABS: 10.0000 mg | ORAL_TABLET | Freq: Every day | ORAL | 3 refills | Status: DC

## 2020-09-12 MED ORDER — FLUTICASONE PROPIONATE 50 MCG/ACT NA SUSP: 2.0000 | Freq: Every day | NASAL | 11 refills | Status: DC

## 2020-09-12 MED ORDER — BENZONATATE 100 MG PO CAPS: 100.0000 mg | ORAL_CAPSULE | Freq: Two times a day (BID) | ORAL | 0 refills | Status: DC | PRN

## 2020-09-12 MED ORDER — AMOXICILLIN-POT CLAVULANATE 875-125 MG PO TABS: 1.0000 | ORAL_TABLET | Freq: Two times a day (BID) | ORAL | 0 refills | Status: AC

## 2020-09-12 NOTE — Progress Notes (Signed)
[facility]    Established Patient Office Visit  Subjective:  Patient ID: Manuel Brooks, male    DOB: 1961/10/01  Age: 59 y.o. MRN: [id number]  CC: No chief complaint on file.   HPI Manuel Brooks is a 59 year old male who presents for Follow Up today.     Patient Active Problem List   Diagnosis Date Noted  . Left leg pain 01/12/2020  . Mixed hyperlipidemia 01/12/2020  . Coronavirus infection 10/28/2019  . History of multiple allergies 07/28/2019  . Lower urinary tract symptoms 07/28/2019  . Dyspnea on exertion 05/12/2019  . History of pneumonia 01/21/2019  . Gastroesophageal reflux disease 01/21/2019  . Acute otitis media 12/23/2018  . Cough 12/23/2018  . Hypertension 05/20/2018  . Right shoulder pain 09/03/2014  . Traumatic tear of supraspinatus tendon of left shoulder 07/30/2014  . Pain, dental 08/06/2013  . Penile bleeding 08/06/2013  . Leukocytopenia, unspecified 09/28/2011    Current Status: Since his last office visit, he has had an Urgent Care visit for possible viral infection and cough on last month. Today, he continues to have symptoms with c/o chest congestion and cough he has taken OTC medications with no relief. He has received his vaccinations for COVID19 and was tested at Urgent Care which her was negative for Coronavirus infection. He denies visual changes, chest pain, shortness of breath, heart palpitations, and falls. He has occasional headaches and dizziness with position changes. Denies severe headaches, confusion, seizures, double vision, and blurred vision, nausea and vomiting. He denies fevers, chills, fatigue, recent infections, weight loss, and night sweats. Denies GI problems such as diarrhea, and constipation. He has no reports of blood in stools, dysuria and hematuria. No depression or anxiety reported today.  He is taking all medications as prescribed. He denies pain today.   Past Medical History:  Diagnosis Date   . Allergy   . CAP (community acquired pneumonia) 01/2019  . Elevated liver enzymes 10/2019  . Exertional shortness of breath 04/2019  . GERD (gastroesophageal reflux disease)   . H. pylori infection 03/2020  . Hyperlipidemia 10/2019  . Hypertension   . Leucopenia   . Vitamin D deficiency 11/2019    Past Surgical History:  Procedure Laterality Date  . BIOPSY BONE MARROW  09/21/2011      . OTHER SURGICAL HISTORY     was unable to have childern and had surgery so he could have children 1990    No family history on file.  Social History   Socioeconomic History  . Marital status: Married    Spouse name: Not on file  . Number of children: Not on file  . Years of education: Not on file  . Highest education level: Not on file  Occupational History  . Not on file  Tobacco Use  . Smoking status: Former Smoker    Packs/day: 0.50    Types: Cigarettes    Start date: 10/22/1988    Quit date: 10/22/1998    Years since quitting: 21.9  . Smokeless tobacco: Never Used  Vaping Use  . Vaping Use: Never used  Substance and Sexual Activity  . Alcohol use: No  . Drug use: No    Frequency: 1.0 times per week  . Sexual activity: Yes  Other Topics Concern  . Not on file  Social History Narrative  . Not on file   Social Determinants of Health   Financial Resource Strain:   . Difficulty of Paying  Living Expenses: Not on file  Food Insecurity:   . Worried About Programme researcher, broadcasting/film/video in the Last Year: Not on file  . Ran Out of Food in the Last Year: Not on file  Transportation Needs:   . Lack of Transportation (Medical): Not on file  . Lack of Transportation (Non-Medical): Not on file  Physical Activity:   . Days of Exercise per Week: Not on file  . Minutes of Exercise per Session: Not on file  Stress:   . Feeling of Stress : Not on file  Social Connections:   . Frequency of Communication with Friends and Family: Not on file  . Frequency of Social Gatherings with Friends and  Family: Not on file  . Attends Religious Services: Not on file  . Active Member of Clubs or Organizations: Not on file  . Attends Banker Meetings: Not on file  . Marital Status: Not on file  Intimate Partner Violence:   . Fear of Current or Ex-Partner: Not on file  . Emotionally Abused: Not on file  . Physically Abused: Not on file  . Sexually Abused: Not on file    Outpatient Medications Prior to Visit  Medication Sig Dispense Refill  . albuterol (VENTOLIN HFA) 108 (90 Base) MCG/ACT inhaler Inhale 2 puffs into the lungs every 6 (six) hours as needed for wheezing or shortness of breath. 18 g 6  . amoxicillin (AMOXIL) 500 MG tablet Take 2 capsules (1,000 mg= total), by mouth, 2 times a day X 14 days. 28 tablet 0  . atorvastatin (LIPITOR) 10 MG tablet Take 1 tablet (10 mg total) by mouth daily. 30 tablet 6  . hydrocortisone (ANUSOL-HC) 2.5 % rectal cream Place 1 application rectally 2 (two) times daily. 30 g 3  . ibuprofen (ADVIL) 800 MG tablet TAKE 1 TABLET (800 MG TOTAL) BY MOUTH EVERY 8 (EIGHT) HOURS AS NEEDED. 120 tablet 6  . lisinopril (ZESTRIL) 5 MG tablet Take 1 tablet (5 mg total) by mouth daily. 30 tablet 11  . nitroGLYCERIN (NITRODUR - DOSED IN MG/24 HR) 0.2 mg/hr patch Apply 1/4 patch to the affected area every 24 hours 30 patch 11  . omeprazole (PRILOSEC) 40 MG capsule Take 1 capsule (40 mg total) by mouth daily for 14 days. 14 capsule 0  . tamsulosin (FLOMAX) 0.4 MG CAPS capsule Take 1 capsule (0.4 mg total) by mouth daily. 30 capsule 6  . vitamin C (ASCORBIC ACID) 250 MG tablet Take 1 tablet (250 mg total) by mouth daily. 30 tablet 6  . vitamin E 180 MG (400 UNITS) capsule Take 1 capsule (400 Units total) by mouth daily. 30 capsule 6  . Zinc 100 MG TABS Take 100 mg by mouth daily.    . cetirizine (ZYRTEC) 10 MG tablet Take 1 tablet (10 mg total) by mouth daily. 30 tablet 11  . fluticasone (FLONASE) 50 MCG/ACT nasal spray Place 2 sprays into both nostrils daily.  9.9 g 11   No facility-administered medications prior to visit.    No Known Allergies  ROS Review of Systems  Constitutional: Negative.   HENT: Negative.   Eyes: Negative.   Respiratory: Positive for cough (frequent).   Cardiovascular: Negative.   Gastrointestinal: Negative.   Endocrine: Negative.   Genitourinary: Negative.   Musculoskeletal: Positive for arthralgias (generalized).  Skin: Negative.   Allergic/Immunologic: Negative.   Neurological: Positive for dizziness (occasional ) and headaches (occasional ).  Hematological: Negative.   Psychiatric/Behavioral: Negative.    Objective:  Physical Exam Vitals and nursing note reviewed.  Constitutional:      Appearance: Normal appearance.  HENT:     Head: Normocephalic and atraumatic.     Nose: Nose normal.     Mouth/Throat:     Mouth: Mucous membranes are moist.     Pharynx: Oropharynx is clear.  Cardiovascular:     Rate and Rhythm: Normal rate and regular rhythm.     Pulses: Normal pulses.     Heart sounds: Normal heart sounds.  Pulmonary:     Effort: Pulmonary effort is normal.     Breath sounds: Normal breath sounds.  Abdominal:     General: Bowel sounds are normal.     Palpations: Abdomen is soft.  Musculoskeletal:        General: Normal range of motion.     Cervical back: Normal range of motion and neck supple.  Skin:    General: Skin is warm.  Neurological:     General: No focal deficit present.     Mental Status: He is alert and oriented to person, place, and time.  Psychiatric:        Mood and Affect: Mood normal.        Behavior: Behavior normal.        Thought Content: Thought content normal.        Judgment: Judgment normal.     There were no vitals taken for this visit. Wt Readings from Last 3 Encounters:  04/29/20 179 lb (81.2 kg)  04/13/20 179 lb 0.2 oz (81.2 kg)  01/12/20 175 lb 3.2 oz (79.5 kg)     Health Maintenance Due  Topic Date Due  . COVID-19 Vaccine (1) Never done  .  COLONOSCOPY  Never done  . INFLUENZA VACCINE  05/22/2020    There are no preventive care reminders to display for this patient.  Lab Results  Component Value Date   TSH 0.715 11/20/2019   Lab Results  Component Value Date   WBC 4.1 11/20/2019   HGB 14.1 11/20/2019   HCT 41.4 11/20/2019   MCV 81 11/20/2019   PLT 269 11/20/2019   Lab Results  Component Value Date   NA 137 11/20/2019   K 3.9 11/20/2019   CO2 24 11/20/2019   GLUCOSE 84 11/20/2019   BUN 13 11/20/2019   CREATININE 0.91 11/20/2019   BILITOT 0.4 11/20/2019   ALKPHOS 70 11/20/2019   AST 50 (H) 11/20/2019   ALT 71 (H) 11/20/2019   PROT 7.3 11/20/2019   ALBUMIN 4.3 11/20/2019   CALCIUM 9.3 11/20/2019   ANIONGAP 12 10/16/2019   Lab Results  Component Value Date   CHOL 233 (H) 11/20/2019   Lab Results  Component Value Date   HDL 51 11/20/2019   Lab Results  Component Value Date   LDLCALC 167 (H) 11/20/2019   Lab Results  Component Value Date   TRIG 87 11/20/2019   Lab Results  Component Value Date   CHOLHDL 4.6 11/20/2019   Lab Results  Component Value Date   HGBA1C 5.6 11/20/2019      Assessment & Plan:   1. Viral infection - amoxicillin-clavulanate (AUGMENTIN) 875-125 MG tablet; Take 1 tablet by mouth 2 (two) times daily for 7 days.  Dispense: 14 tablet; Refill: 0  2. Cough - benzonatate (TESSALON) 100 MG capsule; Take 1 capsule (100 mg total) by mouth 2 (two) times daily as needed for cough.  Dispense: 20 capsule; Refill: 0  3. History of multiple allergies - cetirizine (  ZYRTEC) 10 MG tablet; Take 1 tablet (10 mg total) by mouth daily.  Dispense: 90 tablet; Refill: 3 - fluticasone (FLONASE) 50 MCG/ACT nasal spray; Place 2 sprays into both nostrils daily.  Dispense: 9.9 g; Refill: 11  4. Essential hypertension The current medical regimen is effective; blood pressure is stable at 129/84 today; continue present plan and medications as prescribed. He will continue to take medications as  prescribed, to decrease high sodium intake, excessive alcohol intake, increase potassium intake, smoking cessation, and increase physical activity of at least 30 minutes of cardio activity daily. He will continue to follow Heart Healthy or DASH diet.  5. Follow up He will follow up in 2 months for Annual Physical  Meds ordered this encounter  Medications  . DISCONTD: benzonatate (TESSALON) 100 MG capsule    Sig: Take 1 capsule (100 mg total) by mouth 2 (two) times daily as needed for cough.    Dispense:  20 capsule    Refill:  0  . benzonatate (TESSALON) 100 MG capsule    Sig: Take 1 capsule (100 mg total) by mouth 2 (two) times daily as needed for cough.    Dispense:  20 capsule    Refill:  0  . amoxicillin-clavulanate (AUGMENTIN) 875-125 MG tablet    Sig: Take 1 tablet by mouth 2 (two) times daily for 7 days.    Dispense:  14 tablet    Refill:  0  . cetirizine (ZYRTEC) 10 MG tablet    Sig: Take 1 tablet (10 mg total) by mouth daily.    Dispense:  90 tablet    Refill:  3  . fluticasone (FLONASE) 50 MCG/ACT nasal spray    Sig: Place 2 sprays into both nostrils daily.    Dispense:  9.9 g    Refill:  11    No orders of the defined types were placed in this encounter.   Referral Orders  No referral(s) requested today    Raliegh IpNatalie Eleanor Gatliff,  MSN, FNP-BC [facility] [address]  [place], [place] [id number] [phone] fax   Problem List Items Addressed This Visit      Other   Cough   Relevant Medications   benzonatate (TESSALON) 100 MG capsule   History of multiple allergies   Relevant Medications   cetirizine (ZYRTEC) 10 MG tablet   fluticasone (FLONASE) 50 MCG/ACT nasal spray    Other Visit Diagnoses    Viral infection    -  Primary   Relevant Medications   amoxicillin-clavulanate (AUGMENTIN) 875-125 MG tablet   Essential hypertension       Follow up           Meds ordered this encounter  Medications  . DISCONTD: benzonatate (TESSALON) 100 MG capsule    Sig: Take 1 capsule (100 mg total) by mouth 2 (two) times daily as needed for cough.    Dispense:  20 capsule    Refill:  0  . benzonatate (TESSALON) 100 MG capsule    Sig: Take 1 capsule (100 mg total) by mouth 2 (two) times daily as needed for cough.    Dispense:  20 capsule    Refill:  0  . amoxicillin-clavulanate (AUGMENTIN) 875-125 MG tablet    Sig: Take 1 tablet by mouth 2 (two) times daily for 7 days.    Dispense:  14 tablet    Refill:  0  . cetirizine (ZYRTEC) 10 MG tablet  Sig: Take 1 tablet (10 mg total) by mouth daily.    Dispense:  90 tablet    Refill:  3  . fluticasone (FLONASE) 50 MCG/ACT nasal spray    Sig: Place 2 sprays into both nostrils daily.    Dispense:  9.9 g    Refill:  11    Follow-up: No follow-ups on file.    Kallie Locks, FNP

## 2020-09-13 ENCOUNTER — Encounter: Payer: Self-pay | Admitting: Family Medicine

## 2020-09-23 ENCOUNTER — Telehealth: Payer: Self-pay | Admitting: Family Medicine

## 2020-09-23 NOTE — Telephone Encounter (Signed)
Done

## 2020-10-10 ENCOUNTER — Ambulatory Visit: Payer: Medicaid Other | Admitting: Family Medicine

## 2020-10-18 MED FILL — LISINOPRIL 5 MG TABLET: 5 | 90 days supply | Qty: 90 | Fill #2

## 2020-11-11 ENCOUNTER — Telehealth (INDEPENDENT_AMBULATORY_CARE_PROVIDER_SITE_OTHER): Payer: Medicaid Other | Admitting: Family Medicine

## 2020-11-11 DIAGNOSIS — I10 Essential (primary) hypertension: Secondary | ICD-10-CM | POA: Diagnosis not present

## 2020-11-11 DIAGNOSIS — R059 Cough, unspecified: Secondary | ICD-10-CM | POA: Diagnosis not present

## 2020-11-11 DIAGNOSIS — K219 Gastro-esophageal reflux disease without esophagitis: Secondary | ICD-10-CM | POA: Diagnosis not present

## 2020-11-11 DIAGNOSIS — Z09 Encounter for follow-up examination after completed treatment for conditions other than malignant neoplasm: Secondary | ICD-10-CM | POA: Diagnosis not present

## 2020-11-11 MED ORDER — BENZONATATE 100 MG PO CAPS: 100.0000 mg | ORAL_CAPSULE | Freq: Two times a day (BID) | ORAL | 0 refills | Status: DC | PRN

## 2020-11-11 NOTE — Progress Notes (Signed)
Virtual Visit via Telephone Note  I connected with Manuel Brooks on 11/11/20 at  8:40 AM EST by telephone and verified that I am speaking with the correct person using two identifiers.  Location: Patient: Home Provider: Office   I discussed the limitations, risks, security and privacy concerns of performing an evaluation and management service by telephone and the availability of in person appointments. I also discussed with the patient that there may be a patient responsible charge related to this service. The patient expressed understanding and agreed to proceed.   History of Present Illness:   Patient Active Problem List   Diagnosis Date Noted  . Left leg pain 01/12/2020  . Mixed hyperlipidemia 01/12/2020  . Coronavirus infection 10/28/2019  . History of multiple allergies 07/28/2019  . Lower urinary tract symptoms 07/28/2019  . Dyspnea on exertion 05/12/2019  . History of pneumonia 01/21/2019  . Gastroesophageal reflux disease 01/21/2019  . Acute otitis media 12/23/2018  . Cough 12/23/2018  . Hypertension 05/20/2018  . Right shoulder pain 09/03/2014  . Traumatic tear of supraspinatus tendon of left shoulder 07/30/2014  . Pain, dental 08/06/2013  . Penile bleeding 08/06/2013  . Leukocytopenia, unspecified 09/28/2011    Current Status: Since his last office visit, he is doing well with no complaints. He continues to have c/o occasional chest congestion and cough. He also states that he also has 'burning sensation' in his abdomen after he eats and lies down. We will use Arabic Interpreter today for effective communication. He denies visual changes, chest pain, shortness of breath, heart palpitations, and falls. He has occasional headaches and dizziness with position changes. Denies severe headaches, confusion, seizures, double vision, and blurred vision, nausea and vomiting. He denies fevers, chills, fatigue, recent infections, weight loss, and night sweats. Denies GI problems such as  diarrhea, and constipation. He has no reports of blood in stools, dysuria and hematuria. No depression or anxiety reported today. He is taking all medications as prescribed. He denies pain today.   Observations/Objective:  Telephone Virtual Visit   Assessment and Plan:  1. Gastroesophageal reflux disease without esophagitis  2. Cough  3. Essential hypertension He will continue to take medications as prescribed, to decrease high sodium intake, excessive alcohol intake, increase potassium intake, smoking cessation, and increase physical activity of at least 30 minutes of cardio activity daily. He will continue to follow Heart Healthy or DASH diet.  4. Follow up He will follow up in 1 month for Annual Physical and Labwork.   No orders of the defined types were placed in this encounter.   No orders of the defined types were placed in this encounter.   Referral Orders  No referral(s) requested today    Raliegh Ip, MSN, ANE, FNP-BC [facility] [address] [place], [place] [address] [phone] fax     I discussed the assessment and treatment plan with the patient. The patient was provided an opportunity to ask questions and all were answered. The patient agreed with the plan and demonstrated an understanding of the instructions.   The patient was advised to call back or seek an in-person evaluation if the symptoms worsen or if the condition fails to improve as anticipated.  I provided 20 minutes of non-face-to-face time during this encounter.   Kallie Locks, FNP

## 2020-11-15 ENCOUNTER — Encounter: Payer: Self-pay | Admitting: Family Medicine

## 2020-12-12 ENCOUNTER — Other Ambulatory Visit: Payer: Self-pay

## 2020-12-12 ENCOUNTER — Encounter: Payer: Self-pay | Admitting: Family Medicine

## 2020-12-12 ENCOUNTER — Ambulatory Visit: Payer: Medicaid Other | Admitting: Family Medicine

## 2020-12-12 ENCOUNTER — Other Ambulatory Visit: Payer: Self-pay | Admitting: Family Medicine

## 2020-12-12 VITALS — BP 127/73 | HR 80 | Temp 99.3°F | Resp 18 | Ht 62.6 in | Wt 187.8 lb

## 2020-12-12 DIAGNOSIS — R1084 Generalized abdominal pain: Secondary | ICD-10-CM | POA: Diagnosis not present

## 2020-12-12 DIAGNOSIS — G8929 Other chronic pain: Secondary | ICD-10-CM

## 2020-12-12 DIAGNOSIS — I10 Essential (primary) hypertension: Secondary | ICD-10-CM | POA: Diagnosis not present

## 2020-12-12 DIAGNOSIS — K219 Gastro-esophageal reflux disease without esophagitis: Secondary | ICD-10-CM | POA: Diagnosis not present

## 2020-12-12 DIAGNOSIS — Z09 Encounter for follow-up examination after completed treatment for conditions other than malignant neoplasm: Secondary | ICD-10-CM | POA: Diagnosis not present

## 2020-12-12 MED ORDER — PANTOPRAZOLE SODIUM 40 MG PO TBEC
40.0000 mg | DELAYED_RELEASE_TABLET | Freq: Every day | ORAL | 6 refills | Status: DC
Start: 2020-12-12 — End: 2020-12-12

## 2020-12-12 MED FILL — PANTOPRAZOLE SOD DR 40 MG T: 40 | 30 days supply | Qty: 30 | Fill #0

## 2020-12-12 NOTE — Progress Notes (Signed)
Pt reports had abdominal pain the last 2 days, relative who is doctor prescribed ulcer medication that helped/provided relief, pt does not know name of med

## 2020-12-12 NOTE — Progress Notes (Signed)
[facility]    Established Patient Office Visit  Subjective:  Patient ID: Manuel Brooks, male    DOB: [birth year]  Age: 60 y.o. MRN: [id number]  CC:  Chief Complaint  Patient presents with  . Follow-up    HPI Manuel Brooks is a 60 year old male who presents for Sick Visit today.    Patient Active Problem List   Diagnosis Date Noted  . Left leg pain 01/12/2020  . Mixed hyperlipidemia 01/12/2020  . Coronavirus infection 10/28/2019  . History of multiple allergies 07/28/2019  . Lower urinary tract symptoms 07/28/2019  . Dyspnea on exertion 05/12/2019  . History of pneumonia 01/21/2019  . Gastroesophageal reflux disease 01/21/2019  . Acute otitis media 12/23/2018  . Cough 12/23/2018  . Hypertension 05/20/2018  . Right shoulder pain 09/03/2014  . Traumatic tear of supraspinatus tendon of left shoulder 07/30/2014  . Pain, dental 08/06/2013  . Penile bleeding 08/06/2013  . Leukocytopenia, unspecified 09/28/2011    Current Status: Since his last office visit, he has c/o continual abdominal pain, which he r/t an incident where he fell on his stomach while working. He reports abdominal pain as intermittent and at 8/10 pain level. Denies GI problems such as nausea, vomiting,  diarrhea, and constipation. He has no reports of blood in stools, dysuria and hematuria. No depression or anxiety reported today. He is taking all medications as prescribed. He denies pain today.   Past Medical History:  Diagnosis Date  . Allergy   . CAP (community acquired pneumonia) 01/2019  . Elevated liver enzymes 10/2019  . Exertional shortness of breath 04/2019  . GERD (gastroesophageal reflux disease)   . H. pylori infection 03/2020  . Hyperlipidemia 10/2019  . Hypertension   . Leucopenia   . Vitamin D deficiency 11/2019    Past Surgical History:  Procedure Laterality Date  . BIOPSY BONE MARROW  09/21/2011      . OTHER SURGICAL HISTORY     was unable to  have childern and had surgery so he could have children 1990    No family history on file.  Social History   Socioeconomic History  . Marital status: Married    Spouse name: Not on file  . Number of children: Not on file  . Years of education: Not on file  . Highest education level: Not on file  Occupational History  . Not on file  Tobacco Use  . Smoking status: Former Smoker    Packs/day: 0.50    Types: Cigarettes    Start date: 10/22/1988    Quit date: 10/22/1998    Years since quitting: 22.1  . Smokeless tobacco: Never Used  Vaping Use  . Vaping Use: Never used  Substance and Sexual Activity  . Alcohol use: No  . Drug use: No    Frequency: 1.0 times per week  . Sexual activity: Yes  Other Topics Concern  . Not on file  Social History Narrative  . Not on file   Social Determinants of Health   Financial Resource Strain: Not on file  Food Insecurity: Not on file  Transportation Needs: Not on file  Physical Activity: Not on file  Stress: Not on file  Social Connections: Not on file  Intimate Partner Violence: Not on file    Outpatient Medications Prior to Visit  Medication Sig Dispense Refill  . albuterol (VENTOLIN HFA) 108 (90 Base) MCG/ACT inhaler Inhale 2 puffs into the lungs every 6 (six) hours  as needed for wheezing or shortness of breath. 18 g 6  . atorvastatin (LIPITOR) 10 MG tablet Take 1 tablet (10 mg total) by mouth daily. 30 tablet 6  . cetirizine (ZYRTEC) 10 MG tablet Take 1 tablet (10 mg total) by mouth daily. 90 tablet 3  . fluticasone (FLONASE) 50 MCG/ACT nasal spray Place 2 sprays into both nostrils daily. 9.9 g 11  . hydrocortisone (ANUSOL-HC) 2.5 % rectal cream Place 1 application rectally 2 (two) times daily. 30 g 3  . ibuprofen (ADVIL) 800 MG tablet TAKE 1 TABLET (800 MG TOTAL) BY MOUTH EVERY 8 (EIGHT) HOURS AS NEEDED. 120 tablet 6  . lisinopril (ZESTRIL) 5 MG tablet Take 1 tablet (5 mg total) by mouth daily. 30 tablet 11  . nitroGLYCERIN  (NITRODUR - DOSED IN MG/24 HR) 0.2 mg/hr patch Apply 1/4 patch to the affected area every 24 hours 30 patch 11  . tamsulosin (FLOMAX) 0.4 MG CAPS capsule Take 1 capsule (0.4 mg total) by mouth daily. 30 capsule 6  . vitamin C (ASCORBIC ACID) 250 MG tablet Take 1 tablet (250 mg total) by mouth daily. 30 tablet 6  . vitamin E 180 MG (400 UNITS) capsule Take 1 capsule (400 Units total) by mouth daily. 30 capsule 6  . Zinc 100 MG TABS Take 100 mg by mouth daily.    . benzonatate (TESSALON) 100 MG capsule Take 1 capsule (100 mg total) by mouth 2 (two) times daily as needed for cough. 20 capsule 0  . omeprazole (PRILOSEC) 40 MG capsule Take 1 capsule (40 mg total) by mouth daily for 14 days. 14 capsule 0   No facility-administered medications prior to visit.    No Known Allergies  ROS Review of Systems  Constitutional: Negative.   Respiratory: Negative.   Cardiovascular: Negative.   Gastrointestinal: Positive for abdominal distention (obese) and abdominal pain (frequent).  Genitourinary: Negative.   Musculoskeletal: Positive for arthralgias (generalized. ).  Neurological: Positive for dizziness (occasional ) and headaches (occasional ).  Psychiatric/Behavioral: Positive for agitation. The patient is nervous/anxious and is hyperactive.     Objective:    Physical Exam Vitals and nursing note reviewed.  Constitutional:      Appearance: Normal appearance.  Cardiovascular:     Rate and Rhythm: Normal rate and regular rhythm.     Pulses: Normal pulses.     Heart sounds: Normal heart sounds.  Pulmonary:     Effort: Pulmonary effort is normal.     Breath sounds: Normal breath sounds.  Abdominal:     General: Bowel sounds are normal. There is distension (obese).     Palpations: Abdomen is soft.  Skin:    General: Skin is warm and dry.  Neurological:     Mental Status: He is alert.  Psychiatric:        Thought Content: Thought content normal.        Judgment: Judgment normal.      Comments: hyperactive     BP 127/73 (BP Location: Left Arm, Patient Position: Sitting, Cuff Size: Normal)   Pulse 80   Temp 99.3 F (37.4 C) (Temporal)   Resp 18   Ht 5' 2.6" (1.59 m)   Wt 187 lb 12.8 oz (85.2 kg)   SpO2 98%   BMI 33.70 kg/m  Wt Readings from Last 3 Encounters:  12/12/20 187 lb 12.8 oz (85.2 kg)  09/12/20 186 lb 4.8 oz (84.5 kg)  04/29/20 179 lb (81.2 kg)     Health Maintenance Due  Topic Date Due  . COLONOSCOPY (Pts 45-27yrs Insurance coverage will need to be confirmed)  Never done    There are no preventive care reminders to display for this patient.  Lab Results  Component Value Date   TSH 0.715 11/20/2019   Lab Results  Component Value Date   WBC 4.1 11/20/2019   HGB 14.1 11/20/2019   HCT 41.4 11/20/2019   MCV 81 11/20/2019   PLT 269 11/20/2019   Lab Results  Component Value Date   NA 137 11/20/2019   K 3.9 11/20/2019   CO2 24 11/20/2019   GLUCOSE 84 11/20/2019   BUN 13 11/20/2019   CREATININE 0.91 11/20/2019   BILITOT 0.4 11/20/2019   ALKPHOS 70 11/20/2019   AST 50 (H) 11/20/2019   ALT 71 (H) 11/20/2019   PROT 7.3 11/20/2019   ALBUMIN 4.3 11/20/2019   CALCIUM 9.3 11/20/2019   ANIONGAP 12 10/16/2019   Lab Results  Component Value Date   CHOL 233 (H) 11/20/2019   Lab Results  Component Value Date   HDL 51 11/20/2019   Lab Results  Component Value Date   LDLCALC 167 (H) 11/20/2019   Lab Results  Component Value Date   TRIG 87 11/20/2019   Lab Results  Component Value Date   CHOLHDL 4.6 11/20/2019   Lab Results  Component Value Date   HGBA1C 5.6 11/20/2019    Assessment & Plan:   1. Chronic generalized abdominal pain - US Abdomen Complete; Future  2. Gastroesophageal reflux disease without esophagitis Omeprazole ineffective. We will initiate Pantoprazole today.  - pantoprazole (PROTONIX) 40 MG tablet; Take 1 tablet (40 mg total) by mouth daily.  Dispense: 30 tablet; Refill: 6  3. Essential hypertension The  current medical regimen is effective; blood pressure is stable at 127/73 today; continue present plan and medications as prescribed. He will continue to take medications as prescribed, to decrease high sodium intake, excessive alcohol intake, increase potassium intake, smoking cessation, and increase physical activity of at least 30 minutes of cardio activity daily. He will continue to follow Heart Healthy or DASH diet.  4. Follow up He will keep follow up appointment as scheduled.   Meds ordered this encounter  Medications  . pantoprazole (PROTONIX) 40 MG tablet    Sig: Take 1 tablet (40 mg total) by mouth daily.    Dispense:  30 tablet    Refill:  6    Orders Placed This Encounter  Procedures  . US Abdomen Complete    Referral Orders  No referral(s) requested today    Raliegh Ip, MSN, ANE, FNP-BC [facility] [address] [place], [place] [address] [phone] fax   Problem List Items Addressed This Visit      Digestive   Gastroesophageal reflux disease   Relevant Medications   pantoprazole (PROTONIX) 40 MG tablet    Other Visit Diagnoses    Chronic generalized abdominal pain    -  Primary   Relevant Orders   US Abdomen Complete   Essential hypertension       Follow up          Meds ordered this encounter  Medications  . pantoprazole (PROTONIX) 40 MG tablet    Sig: Take 1 tablet (40 mg total) by mouth daily.    Dispense:  30 tablet    Refill:  6    Follow-up: No follow-ups on file.    Kallie Locks,  FNP 

## 2020-12-21 ENCOUNTER — Ambulatory Visit (HOSPITAL_COMMUNITY)
Admission: RE | Admit: 2020-12-21 | Discharge: 2020-12-21 | Disposition: A | Discharge status: Home / Self Care | Payer: Medicaid Other | Source: Ambulatory Visit | Attending: Family Medicine | Admitting: Family Medicine

## 2020-12-21 DIAGNOSIS — R1084 Generalized abdominal pain: Secondary | ICD-10-CM | POA: Insufficient documentation

## 2020-12-21 DIAGNOSIS — G8929 Other chronic pain: Secondary | ICD-10-CM | POA: Insufficient documentation

## 2020-12-21 DIAGNOSIS — K8020 Calculus of gallbladder without cholecystitis without obstruction: Secondary | ICD-10-CM | POA: Diagnosis not present

## 2021-01-02 ENCOUNTER — Other Ambulatory Visit: Payer: Self-pay | Admitting: Family Medicine

## 2021-01-02 DIAGNOSIS — E782 Mixed hyperlipidemia: Secondary | ICD-10-CM

## 2021-01-13 ENCOUNTER — Other Ambulatory Visit: Payer: Self-pay | Admitting: Family Medicine

## 2021-01-13 DIAGNOSIS — K219 Gastro-esophageal reflux disease without esophagitis: Secondary | ICD-10-CM

## 2021-01-21 ENCOUNTER — Other Ambulatory Visit: Payer: Self-pay

## 2021-01-23 ENCOUNTER — Ambulatory Visit: Payer: Medicaid Other | Admitting: Family Medicine

## 2021-01-23 ENCOUNTER — Encounter: Payer: Self-pay | Admitting: Family Medicine

## 2021-01-23 ENCOUNTER — Other Ambulatory Visit: Payer: Self-pay

## 2021-01-23 VITALS — BP 134/92 | HR 78 | Ht 62.5 in | Wt 183.0 lb

## 2021-01-23 DIAGNOSIS — R21 Rash and other nonspecific skin eruption: Secondary | ICD-10-CM

## 2021-01-23 DIAGNOSIS — Z758 Other problems related to medical facilities and other health care: Secondary | ICD-10-CM

## 2021-01-23 DIAGNOSIS — Z789 Other specified health status: Secondary | ICD-10-CM

## 2021-01-23 DIAGNOSIS — R399 Unspecified symptoms and signs involving the genitourinary system: Secondary | ICD-10-CM | POA: Diagnosis not present

## 2021-01-23 DIAGNOSIS — I10 Essential (primary) hypertension: Secondary | ICD-10-CM

## 2021-01-23 DIAGNOSIS — Z09 Encounter for follow-up examination after completed treatment for conditions other than malignant neoplasm: Secondary | ICD-10-CM | POA: Diagnosis not present

## 2021-01-23 DIAGNOSIS — R3915 Urgency of urination: Secondary | ICD-10-CM | POA: Diagnosis not present

## 2021-01-23 DIAGNOSIS — K219 Gastro-esophageal reflux disease without esophagitis: Secondary | ICD-10-CM | POA: Diagnosis not present

## 2021-01-23 DIAGNOSIS — Z0000 Encounter for general adult medical examination without abnormal findings: Secondary | ICD-10-CM

## 2021-01-23 MED ORDER — TAMSULOSIN HCL 0.4 MG PO CAPS: 0.4000 mg | ORAL_CAPSULE | Freq: Every day | ORAL | 11 refills | Status: DC

## 2021-01-23 MED ORDER — HYDROCORTISONE 0.5 % EX CREA: 1.0000 "application " | TOPICAL_CREAM | Freq: Two times a day (BID) | CUTANEOUS | 3 refills | Status: DC

## 2021-01-23 NOTE — Progress Notes (Signed)
[facility]    Established Patient Office Visit  Subjective:  Patient ID: Manuel Brooks, male    DOB: 1961-04-29  Age: 60 y.o. MRN: [id number]  CC:  Chief Complaint  Patient presents with  . Hypertension    HPI Manuel Brooks is a 60 year old male who presents for Follow Up today.    Patient Active Problem List   Diagnosis Date Noted  . Left leg pain 01/12/2020  . Mixed hyperlipidemia 01/12/2020  . Coronavirus infection 10/28/2019  . History of multiple allergies 07/28/2019  . Lower urinary tract symptoms 07/28/2019  . Dyspnea on exertion 05/12/2019  . History of pneumonia 01/21/2019  . Gastroesophageal reflux disease 01/21/2019  . Acute otitis media 12/23/2018  . Cough 12/23/2018  . Hypertension 05/20/2018  . Right shoulder pain 09/03/2014  . Traumatic tear of supraspinatus tendon of left shoulder 07/30/2014  . Pain, dental 08/06/2013  . Penile bleeding 08/06/2013  . Leukocytopenia, unspecified 09/28/2011   Current Status: Since his last office visit, he is doing well with no complaints. He states that he is now taking Virgin Coconut Oil for acid reflux and it is working. He has not been taking Pantoprazole or any other anti-acids since beginning. Denies GI problems such as diarrhea, and constipation. He has no reports of blood in stools, dysuria and hematuria. He denies visual changes, chest pain, cough, shortness of breath, heart palpitations, and falls. He has occasional headaches and dizziness with position changes. Denies severe headaches, confusion, seizures, double vision, and blurred vision, nausea and vomiting. He denies fevers, chills, fatigue, recent infections, weight loss, and night sweats. No depression or anxiety reported today. He is taking all medications as prescribed. He denies pain today.   Past Medical History:  Diagnosis Date  . Allergy   . CAP (community acquired pneumonia) 01/2019  . Elevated liver enzymes 10/2019   . Exertional shortness of breath 04/2019  . GERD (gastroesophageal reflux disease)   . H. pylori infection 03/2020  . Hyperlipidemia 10/2019  . Hypertension   . Leucopenia   . Vitamin D deficiency 11/2019    Past Surgical History:  Procedure Laterality Date  . BIOPSY BONE MARROW  09/21/2011      . OTHER SURGICAL HISTORY     was unable to have childern and had surgery so he could have children 1990    No family history on file.  Social History   Socioeconomic History  . Marital status: Married    Spouse name: Not on file  . Number of children: Not on file  . Years of education: Not on file  . Highest education level: Not on file  Occupational History  . Not on file  Tobacco Use  . Smoking status: Former Smoker    Packs/day: 0.50    Types: Cigarettes    Start date: 10/22/1988    Quit date: 10/22/1998    Years since quitting: 22.2  . Smokeless tobacco: Never Used  Vaping Use  . Vaping Use: Never used  Substance and Sexual Activity  . Alcohol use: No  . Drug use: No    Frequency: 1.0 times per week  . Sexual activity: Yes  Other Topics Concern  . Not on file  Social History Narrative  . Not on file   Social Determinants of Health   Financial Resource Strain: Not on file  Food Insecurity: Not on file  Transportation Needs: Not on file  Physical Activity: Not on file  Stress: Not on file  Social Connections: Not on file  Intimate Partner Violence: Not on file    Outpatient Medications Prior to Visit  Medication Sig Dispense Refill  . albuterol (VENTOLIN HFA) 108 (90 Base) MCG/ACT inhaler Inhale 2 puffs into the lungs every 6 (six) hours as needed for wheezing or shortness of breath. 18 g 6  . atorvastatin (LIPITOR) 10 MG tablet TAKE 1 TABLET(10 MG) BY MOUTH DAILY 90 tablet 1  . cetirizine (ZYRTEC) 10 MG tablet Take 1 tablet (10 mg total) by mouth daily. 90 tablet 3  . fluticasone (FLONASE) 50 MCG/ACT nasal spray Place 2 sprays into both nostrils daily. 9.9 g  11  . hydrocortisone (ANUSOL-HC) 2.5 % rectal cream Place 1 application rectally 2 (two) times daily. 30 g 3  . ibuprofen (ADVIL) 800 MG tablet TAKE 1 TABLET (800 MG TOTAL) BY MOUTH EVERY 8 (EIGHT) HOURS AS NEEDED. 120 tablet 6  . lisinopril (ZESTRIL) 5 MG tablet Take 1 tablet (5 mg total) by mouth daily. 30 tablet 11  . nitroGLYCERIN (NITRODUR - DOSED IN MG/24 HR) 0.2 mg/hr patch Apply 1/4 patch to the affected area every 24 hours 30 patch 11  . vitamin C (ASCORBIC ACID) 250 MG tablet Take 1 tablet (250 mg total) by mouth daily. 30 tablet 6  . vitamin E 180 MG (400 UNITS) capsule Take 1 capsule (400 Units total) by mouth daily. 30 capsule 6  . Zinc 100 MG TABS Take 100 mg by mouth daily.    Marland Kitchen lisinopril (ZESTRIL) 5 MG tablet TAKE 1 TABLET (5 MG TOTAL) BY MOUTH DAILY. 30 tablet 11  . pantoprazole (PROTONIX) 40 MG tablet TAKE 1 TABLET (40 MG TOTAL) BY MOUTH DAILY. (Patient not taking: Reported on 01/23/2021) 30 tablet 6  . tamsulosin (FLOMAX) 0.4 MG CAPS capsule Take 1 capsule (0.4 mg total) by mouth daily. (Patient not taking: Reported on 01/23/2021) 30 capsule 6   No facility-administered medications prior to visit.    No Known Allergies  ROS Review of Systems  Constitutional: Negative.   HENT: Negative.   Eyes: Negative.   Respiratory: Negative.   Cardiovascular: Negative.   Gastrointestinal: Positive for abdominal distention (obese).  Endocrine: Negative.   Genitourinary: Positive for urgency (occasional ).  Musculoskeletal: Positive for arthralgias (generalized joint pain).  Skin: Negative.   Allergic/Immunologic: Negative.   Neurological: Positive for dizziness (occasional ) and headaches (occasional ).  Hematological: Negative.   Psychiatric/Behavioral: Negative.       Objective:    Physical Exam Vitals and nursing note reviewed.  Constitutional:      Appearance: Normal appearance.  HENT:     Head: Normocephalic and atraumatic.     Nose: Nose normal.     Mouth/Throat:      Mouth: Mucous membranes are moist.     Pharynx: Oropharynx is clear.  Cardiovascular:     Rate and Rhythm: Normal rate and regular rhythm.     Pulses: Normal pulses.     Heart sounds: Normal heart sounds.  Pulmonary:     Effort: Pulmonary effort is normal.     Breath sounds: Normal breath sounds.  Abdominal:     General: Bowel sounds are normal. There is distension (obese).     Palpations: Abdomen is soft.  Musculoskeletal:        General: Normal range of motion.     Cervical back: Normal range of motion and neck supple.  Skin:    General: Skin is warm and dry.  Neurological:  General: No focal deficit present.     Mental Status: He is alert and oriented to person, place, and time.  Psychiatric:        Mood and Affect: Mood normal.        Behavior: Behavior normal.        Thought Content: Thought content normal.        Judgment: Judgment normal.     BP (!) 134/92   Pulse 78   Ht 5' 2.5" (1.588 m)   Wt 183 lb (83 kg)   SpO2 98%   BMI 32.94 kg/m  Wt Readings from Last 3 Encounters:  01/23/21 183 lb (83 kg)  12/12/20 187 lb 12.8 oz (85.2 kg)  09/12/20 186 lb 4.8 oz (84.5 kg)     Health Maintenance Due  Topic Date Due  . COVID-19 Vaccine (1) Never done  . COLONOSCOPY (Pts 45-7529yrs Insurance coverage will need to be confirmed)  Never done    There are no preventive care reminders to display for this patient.  Lab Results  Component Value Date   TSH 0.715 11/20/2019   Lab Results  Component Value Date   WBC 4.1 11/20/2019   HGB 14.1 11/20/2019   HCT 41.4 11/20/2019   MCV 81 11/20/2019   PLT 269 11/20/2019   Lab Results  Component Value Date   NA 137 11/20/2019   K 3.9 11/20/2019   CO2 24 11/20/2019   GLUCOSE 84 11/20/2019   BUN 13 11/20/2019   CREATININE 0.91 11/20/2019   BILITOT 0.4 11/20/2019   ALKPHOS 70 11/20/2019   AST 50 (H) 11/20/2019   ALT 71 (H) 11/20/2019   PROT 7.3 11/20/2019   ALBUMIN 4.3 11/20/2019   CALCIUM 9.3 11/20/2019    ANIONGAP 12 10/16/2019   Lab Results  Component Value Date   CHOL 233 (H) 11/20/2019   Lab Results  Component Value Date   HDL 51 11/20/2019   Lab Results  Component Value Date   LDLCALC 167 (H) 11/20/2019   Lab Results  Component Value Date   TRIG 87 11/20/2019   Lab Results  Component Value Date   CHOLHDL 4.6 11/20/2019   Lab Results  Component Value Date   HGBA1C 5.6 11/20/2019      Assessment & Plan:   1. Language barrier  2. Rash - hydrocortisone cream 0.5 %; Apply 1 application topically 2 (two) times daily.  Dispense: 30 g; Refill: 3  3. Gastroesophageal reflux disease without esophagitis  4. Essential hypertension He will continue to take medications as prescribed, to decrease high sodium intake, excessive alcohol intake, increase potassium intake, smoking cessation, and increase physical activity of at least 30 minutes of cardio activity daily. He will continue to follow Heart Healthy or DASH diet.  5. Lower urinary tract symptoms - tamsulosin (FLOMAX) 0.4 MG CAPS capsule; Take 1 capsule (0.4 mg total) by mouth daily.  Dispense: 30 capsule; Refill: 11  6. Urinary urgency - tamsulosin (FLOMAX) 0.4 MG CAPS capsule; Take 1 capsule (0.4 mg total) by mouth daily.  Dispense: 30 capsule; Refill: 11  7. Healthcare maintenance - Vitamin B12 - Vitamin D, 25-hydroxy - PSA - Lipid Panel - CBC with Differential - Comprehensive metabolic panel  8. Follow up He will follow up in 6 months.   Meds ordered this encounter  Medications  . hydrocortisone cream 0.5 %    Sig: Apply 1 application topically 2 (two) times daily.    Dispense:  30 g    Refill:  3  . tamsulosin (FLOMAX) 0.4 MG CAPS capsule    Sig: Take 1 capsule (0.4 mg total) by mouth daily.    Dispense:  30 capsule    Refill:  11    Orders Placed This Encounter  Procedures  . Vitamin B12  . Vitamin D, 25-hydroxy  . PSA  . Lipid Panel  . CBC with Differential  . Comprehensive metabolic panel     Referral Orders  No referral(s) requested today    Raliegh Ip, MSN, ANE, FNP-BC [facility] [address] [place], [place] [address] [phone]- fax    Problem List Items Addressed This Visit      Digestive   Gastroesophageal reflux disease     Other   Lower urinary tract symptoms   Relevant Medications   tamsulosin (FLOMAX) 0.4 MG CAPS capsule    Other Visit Diagnoses    Language barrier    -  Primary   Rash       Relevant Medications   hydrocortisone cream 0.5 %   Essential hypertension       Urinary urgency       Relevant Medications   tamsulosin (FLOMAX) 0.4 MG CAPS capsule   Healthcare maintenance       Relevant Orders   Vitamin B12   Vitamin D, 25-hydroxy   PSA   Lipid Panel   CBC with Differential   Comprehensive metabolic panel   Follow up          Meds ordered this encounter  Medications  . hydrocortisone cream 0.5 %    Sig: Apply 1 application topically 2 (two) times daily.    Dispense:  30 g    Refill:  3  . tamsulosin (FLOMAX) 0.4 MG CAPS capsule    Sig: Take 1 capsule (0.4 mg total) by mouth daily.    Dispense:  30 capsule    Refill:  11    Follow-up: No follow-ups on file.    Kallie Locks, FNP

## 2021-01-24 LAB — COMPREHENSIVE METABOLIC PANEL
ALT: 27 IU/L (ref 0–44)
AST: 33 IU/L (ref 0–40)
Albumin/Globulin Ratio: 1.5 (ref 1.2–2.2)
Albumin: 4.5 g/dL (ref 3.8–4.8)
Alkaline Phosphatase: 89 IU/L (ref 44–121)
BUN/Creatinine Ratio: 14 (ref 10–24)
BUN: 15 mg/dL (ref 8–27)
Bilirubin Total: 0.5 mg/dL (ref 0.0–1.2)
CO2: 24 mmol/L (ref 20–29)
Calcium: 9.3 mg/dL (ref 8.6–10.2)
Chloride: 102 mmol/L (ref 96–106)
Creatinine, Ser: 1.08 mg/dL (ref 0.76–1.27)
Globulin, Total: 3 g/dL (ref 1.5–4.5)
Glucose: 91 mg/dL (ref 65–99)
Potassium: 4 mmol/L (ref 3.5–5.2)
Sodium: 140 mmol/L (ref 134–144)
Total Protein: 7.5 g/dL (ref 6.0–8.5)
eGFR: 78 mL/min/{1.73_m2} (ref >59)

## 2021-01-24 LAB — CBC WITH DIFFERENTIAL/PLATELET
Basophils Absolute: 0 10*3/uL (ref 0.0–0.2)
Basos: 1 %
EOS (ABSOLUTE): 0.2 10*3/uL (ref 0.0–0.4)
Eos: 7 %
Hematocrit: 46.4 % (ref 37.5–51.0)
Hemoglobin: 15 g/dL (ref 13.0–17.7)
Immature Grans (Abs): 0 10*3/uL (ref 0.0–0.1)
Immature Granulocytes: 1 %
Lymphocytes Absolute: 1.7 10*3/uL (ref 0.7–3.1)
Lymphs: 47 %
MCH: 26.6 pg (ref 26.6–33.0)
MCHC: 32.3 g/dL (ref 31.5–35.7)
MCV: 82 fL (ref 79–97)
Monocytes Absolute: 0.3 10*3/uL (ref 0.1–0.9)
Monocytes: 9 %
Neutrophils Absolute: 1.3 10*3/uL — ABNORMAL LOW (ref 1.4–7.0)
Neutrophils: 35 %
Platelets: 256 10*3/uL (ref 150–450)
RBC: 5.63 x10E6/uL (ref 4.14–5.80)
RDW: 13.5 % (ref 11.6–15.4)
WBC: 3.6 10*3/uL (ref 3.4–10.8)

## 2021-01-24 LAB — LIPID PANEL
Chol/HDL Ratio: 2.8 ratio (ref 0.0–5.0)
Cholesterol, Total: 145 mg/dL (ref 100–199)
HDL: 51 mg/dL (ref >39)
LDL Chol Calc (NIH): 81 mg/dL (ref 0–99)
Triglycerides: 61 mg/dL (ref 0–149)
VLDL Cholesterol Cal: 13 mg/dL (ref 5–40)

## 2021-01-24 LAB — VITAMIN B12: Vitamin B-12: 357 pg/mL (ref 232–1245)

## 2021-01-24 LAB — VITAMIN D 25 HYDROXY (VIT D DEFICIENCY, FRACTURES): Vit D, 25-Hydroxy: 34.5 ng/mL (ref 30.0–100.0)

## 2021-01-24 LAB — PSA: Prostate Specific Ag, Serum: 1 ng/mL (ref 0.0–4.0)

## 2021-01-25 ENCOUNTER — Encounter: Payer: Self-pay | Admitting: Family Medicine

## 2021-03-24 IMAGING — US US ABDOMEN COMPLETE
1 series · 13 of 25 positions shown · non-contrast
Comparison: None.

CLINICAL DATA: Chronic abdominal pain

EXAM:
ABDOMEN ULTRASOUND COMPLETE

[Series 1: us abdomen complete · 13 of 83 slices shown]
[im 1/83]
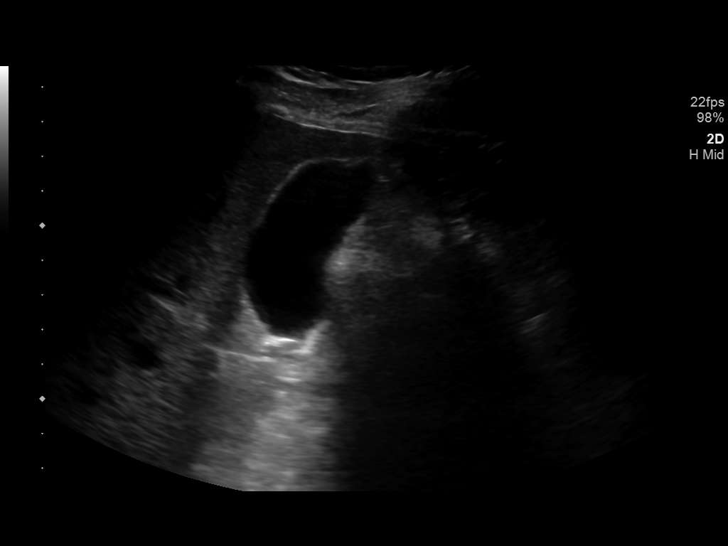
[im 7/83]
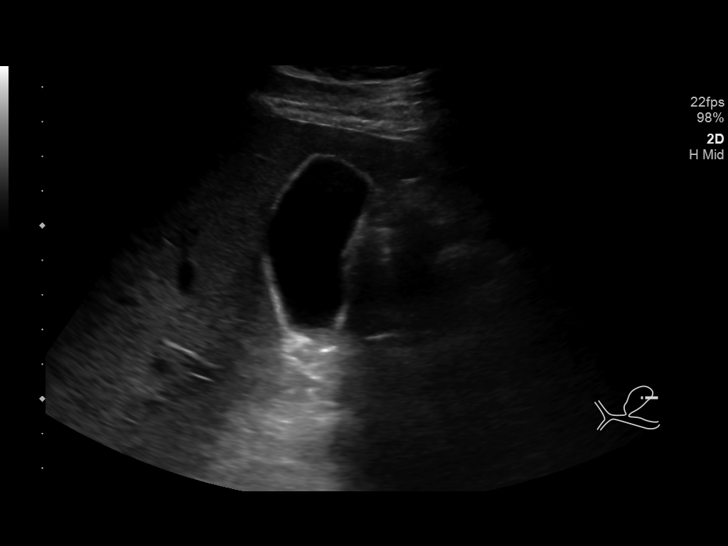
[im 14/83]
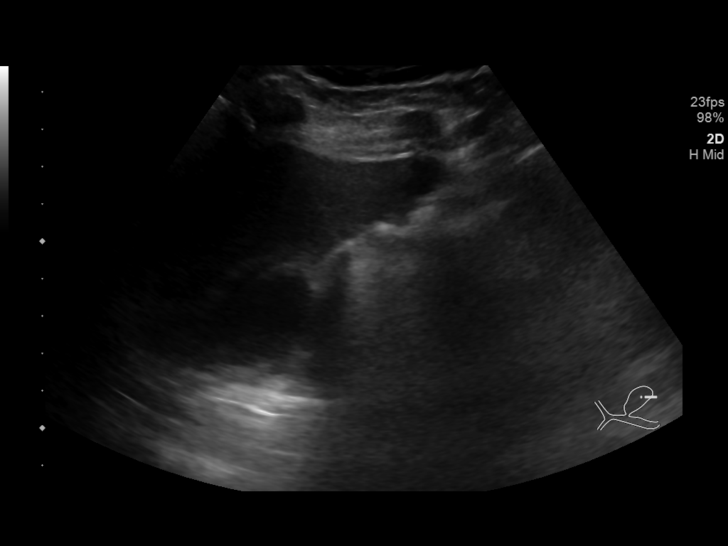
[im 21/83]
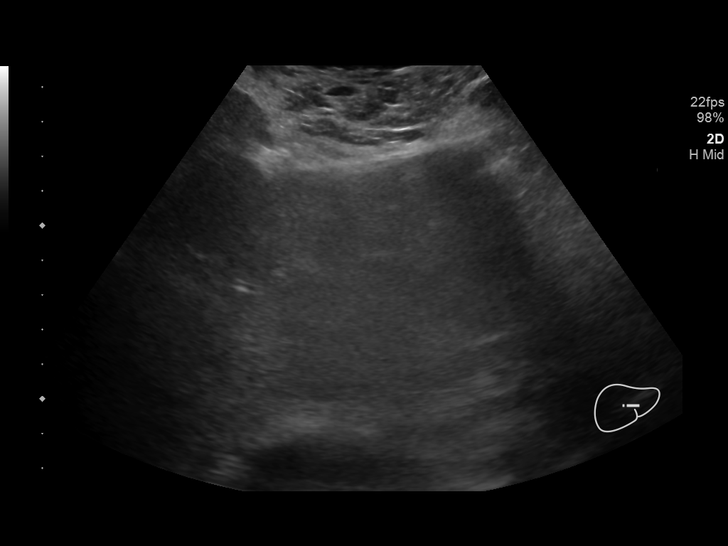
[im 28/83]
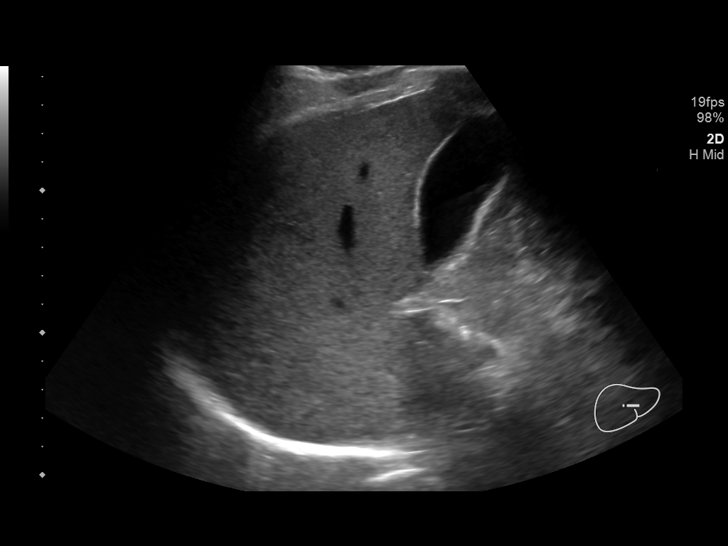
[im 35/83]
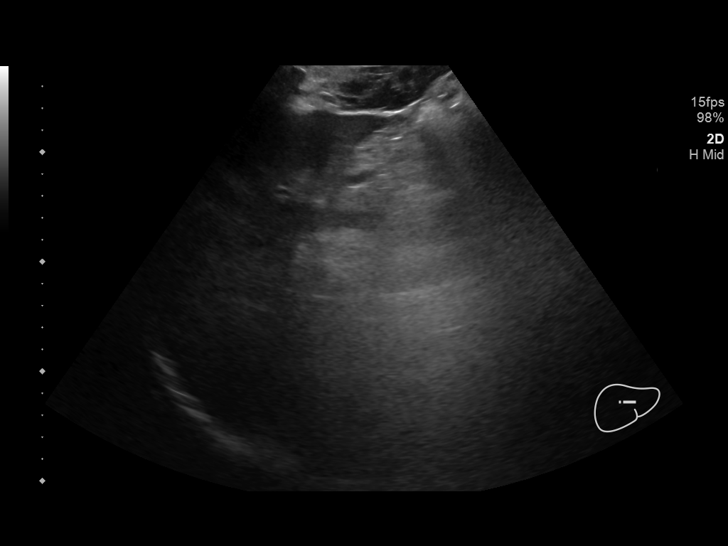
[im 42/83]
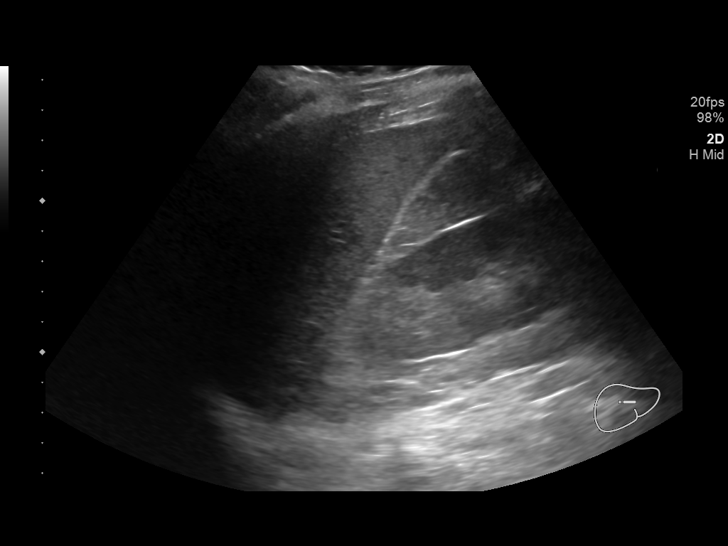
[im 48/83]
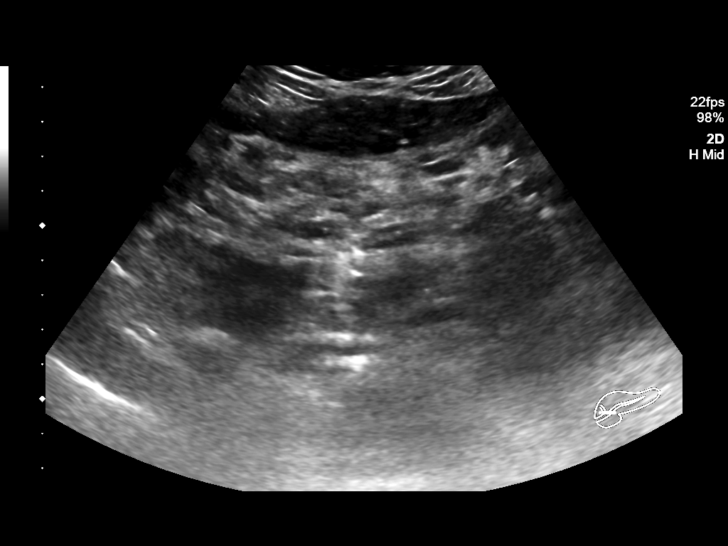
[im 55/83]
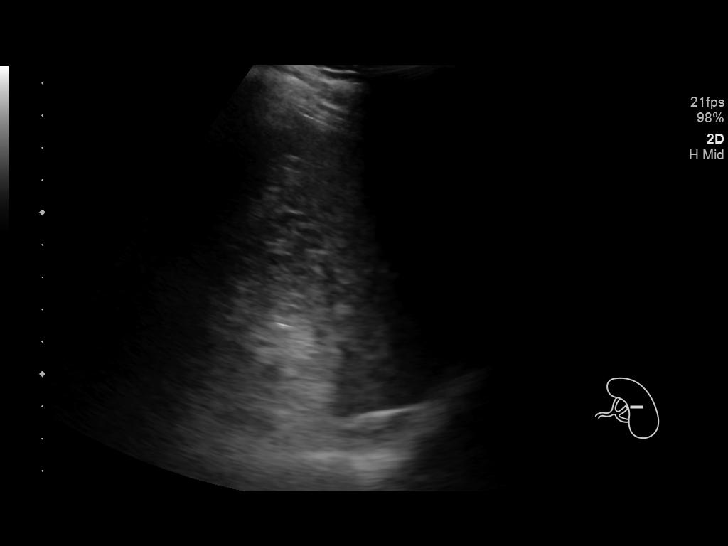
[im 62/83]
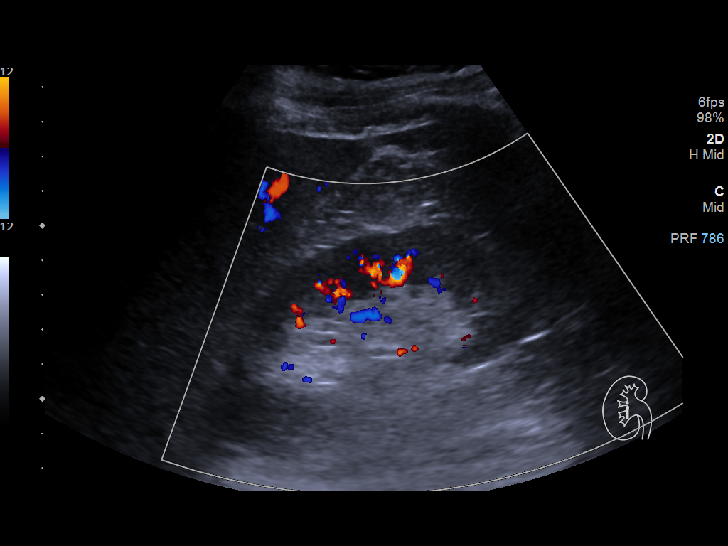
[im 69/83]
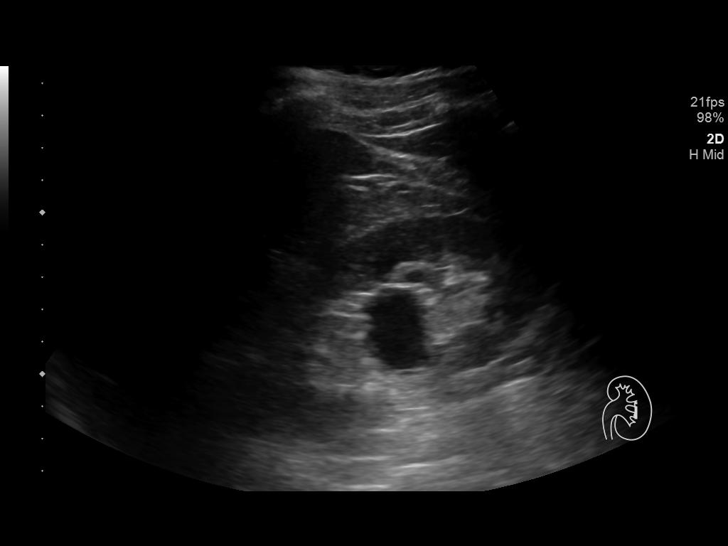
[im 76/83]
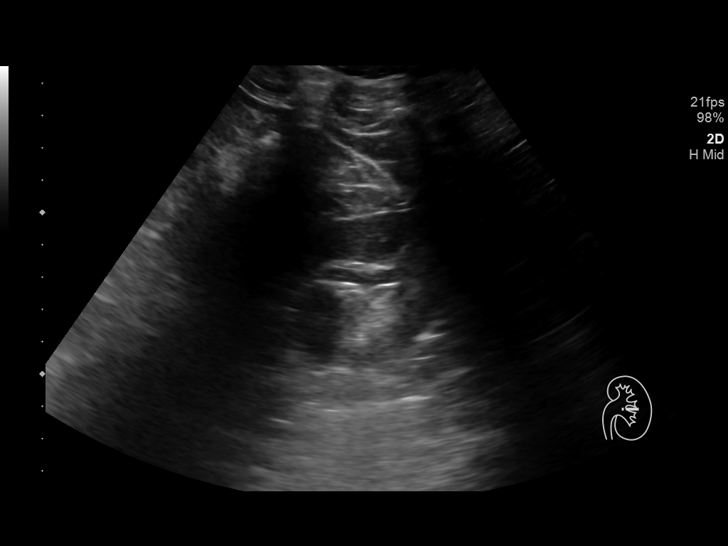
[im 83/83]
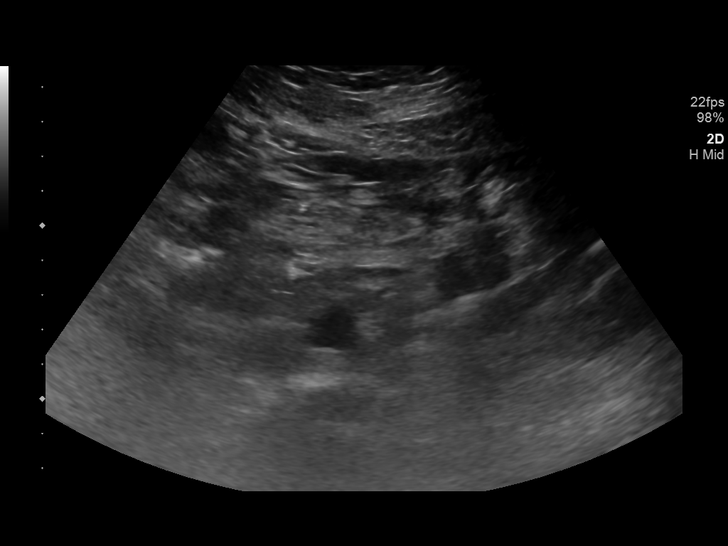

[13 of 25 positions shown; findings below may reference images not displayed]

FINDINGS: Gallbladder: There is layering sludge within the gallbladder lumen,
best seen on image # 12,. The gallbladder, however, is not
distended, there is no gallbladder wall thickening, and no
pericholecystic fluid is identified. The sonographic Murphy sign is
reportedly negative.

Common bile duct: Diameter: 4 mm in diameter above the expected
level of the cystic duct insertion.

Liver: The hepatic parenchymal echogenicity is diffusely mildly
increased suggesting changes of at least mild hepatic steatosis.
Portal vein is patent on color Doppler imaging with normal direction
of blood flow towards the liver.

IVC: No abnormality visualized.

Pancreas: Visualized portion unremarkable.

Spleen: Size and appearance within normal limits.

Right Kidney: Length: 11.5 cm. Echogenicity within normal limits. No
mass or hydronephrosis visualized. There is slightly limited
evaluation of the lower pole of the right kidney due to overlying
bowel gas.

Left Kidney: Length: 11.3 cm. There is either a central parapelvic
cyst or fluid distension of the left renal pelvis without associated
caliceal dilation involving the left kidney. The left renal cortex
is preserved and cortical echogenicity is normal. No intrarenal
masses or calcifications are seen.

Abdominal aorta: No aneurysm visualized.

Other findings: None.
IMPRESSION: Cholelithiasis without sonographic evidence of acute cholecystitis.

Mild hepatic steatosis.

Possible mild left hydronephrosis. Renal pelvic dilatation versus
parapelvic cysts could be differentiated with CT urogram or
intravenous pyelogram if desired. However, given the lack of
caliceal dilation and preserved renal cortex, this may be of limited
clinical significance.

## 2021-04-13 ENCOUNTER — Other Ambulatory Visit: Payer: Self-pay | Admitting: Nurse Practitioner

## 2021-04-13 DIAGNOSIS — I10 Essential (primary) hypertension: Secondary | ICD-10-CM

## 2021-04-13 DIAGNOSIS — E782 Mixed hyperlipidemia: Secondary | ICD-10-CM

## 2021-04-25 ENCOUNTER — Other Ambulatory Visit: Payer: Medicaid Other

## 2021-04-25 ENCOUNTER — Other Ambulatory Visit: Payer: Self-pay

## 2021-04-25 DIAGNOSIS — E782 Mixed hyperlipidemia: Secondary | ICD-10-CM

## 2021-04-25 DIAGNOSIS — I10 Essential (primary) hypertension: Secondary | ICD-10-CM | POA: Diagnosis not present

## 2021-04-26 LAB — COMPREHENSIVE METABOLIC PANEL
ALT: 21 IU/L (ref 0–44)
AST: 29 IU/L (ref 0–40)
Albumin/Globulin Ratio: 1.7 (ref 1.2–2.2)
Albumin: 4.5 g/dL (ref 3.8–4.8)
Alkaline Phosphatase: 79 IU/L (ref 44–121)
BUN/Creatinine Ratio: 11 (ref 10–24)
BUN: 11 mg/dL (ref 8–27)
Bilirubin Total: 0.4 mg/dL (ref 0.0–1.2)
CO2: 24 mmol/L (ref 20–29)
Calcium: 9.1 mg/dL (ref 8.6–10.2)
Chloride: 102 mmol/L (ref 96–106)
Creatinine, Ser: 1.01 mg/dL (ref 0.76–1.27)
Globulin, Total: 2.6 g/dL (ref 1.5–4.5)
Glucose: 98 mg/dL (ref 65–99)
Potassium: 4.4 mmol/L (ref 3.5–5.2)
Sodium: 138 mmol/L (ref 134–144)
Total Protein: 7.1 g/dL (ref 6.0–8.5)
eGFR: 84 mL/min/{1.73_m2} (ref >59)

## 2021-04-26 LAB — LIPID PANEL
Chol/HDL Ratio: 3 ratio (ref 0.0–5.0)
Cholesterol, Total: 151 mg/dL (ref 100–199)
HDL: 50 mg/dL (ref >39)
LDL Chol Calc (NIH): 86 mg/dL (ref 0–99)
Triglycerides: 76 mg/dL (ref 0–149)
VLDL Cholesterol Cal: 15 mg/dL (ref 5–40)

## 2021-05-02 ENCOUNTER — Telehealth: Payer: Self-pay

## 2021-05-02 NOTE — Telephone Encounter (Signed)
-----   Message from Barbette Merino, NP sent at 04/27/2021 12:35 PM EDT ----- Labs are all normal

## 2021-05-02 NOTE — Telephone Encounter (Signed)
Left VM with normal results. Advised to call back with any questions or concerns.

## 2021-05-03 ENCOUNTER — Ambulatory Visit: Payer: Medicaid Other | Admitting: Nurse Practitioner

## 2021-05-16 ENCOUNTER — Other Ambulatory Visit: Payer: Self-pay

## 2021-05-16 DIAGNOSIS — I10 Essential (primary) hypertension: Secondary | ICD-10-CM

## 2021-05-16 MED ORDER — LISINOPRIL 5 MG PO TABS: 5.0000 mg | ORAL_TABLET | Freq: Every day | ORAL | 11 refills | Status: DC

## 2021-06-29 ENCOUNTER — Other Ambulatory Visit: Payer: Self-pay

## 2021-06-29 ENCOUNTER — Ambulatory Visit (INDEPENDENT_AMBULATORY_CARE_PROVIDER_SITE_OTHER): Payer: Medicaid Other | Admitting: Nurse Practitioner

## 2021-06-29 ENCOUNTER — Encounter: Payer: Self-pay | Admitting: Nurse Practitioner

## 2021-06-29 VITALS — BP 134/88 | HR 66 | Temp 98.0°F | Ht 62.5 in | Wt 186.0 lb

## 2021-06-29 DIAGNOSIS — E782 Mixed hyperlipidemia: Secondary | ICD-10-CM

## 2021-06-29 DIAGNOSIS — K649 Unspecified hemorrhoids: Secondary | ICD-10-CM | POA: Diagnosis not present

## 2021-06-29 DIAGNOSIS — Z87448 Personal history of other diseases of urinary system: Secondary | ICD-10-CM

## 2021-06-29 DIAGNOSIS — R0600 Dyspnea, unspecified: Secondary | ICD-10-CM

## 2021-06-29 DIAGNOSIS — I10 Essential (primary) hypertension: Secondary | ICD-10-CM | POA: Diagnosis not present

## 2021-06-29 DIAGNOSIS — R3915 Urgency of urination: Secondary | ICD-10-CM | POA: Diagnosis not present

## 2021-06-29 DIAGNOSIS — R0609 Other forms of dyspnea: Secondary | ICD-10-CM

## 2021-06-29 LAB — POCT URINALYSIS DIPSTICK
Bilirubin, UA: NEGATIVE
Blood, UA: NEGATIVE
Glucose, UA: NEGATIVE
Ketones, UA: NEGATIVE
Leukocytes, UA: NEGATIVE
Nitrite, UA: NEGATIVE
Protein, UA: NEGATIVE
Spec Grav, UA: 1.025 (ref 1.010–1.025)
Urobilinogen, UA: 0.2 E.U./dL
pH, UA: 5.5 (ref 5.0–8.0)

## 2021-06-29 MED ORDER — LISINOPRIL 5 MG PO TABS: 5.0000 mg | ORAL_TABLET | Freq: Every day | ORAL | 11 refills | Status: DC

## 2021-06-29 MED ORDER — ATORVASTATIN CALCIUM 10 MG PO TABS: 10.0000 mg | ORAL_TABLET | Freq: Every day | ORAL | 11 refills | Status: DC

## 2021-06-29 MED ORDER — ALBUTEROL SULFATE HFA 108 (90 BASE) MCG/ACT IN AERS: 2.0000 | INHALATION_SPRAY | Freq: Four times a day (QID) | RESPIRATORY_TRACT | 6 refills | Status: DC | PRN

## 2021-06-29 MED ORDER — TAMSULOSIN HCL 0.4 MG PO CAPS: 0.4000 mg | ORAL_CAPSULE | Freq: Every day | ORAL | 11 refills | Status: DC

## 2021-06-29 MED ORDER — HYDROCORTISONE 1 % EX CREA: 1.0000 "application " | TOPICAL_CREAM | Freq: Two times a day (BID) | CUTANEOUS | 11 refills | Status: DC

## 2021-06-29 NOTE — Patient Instructions (Signed)
Health Maintenance, Male Adopting a healthy lifestyle and getting preventive care are important in promoting health and wellness. Ask your health care provider about: The right schedule for you to have regular tests and exams. Things you can do on your own to prevent diseases and keep yourself healthy. What should I know about diet, weight, and exercise? Eat a healthy diet  Eat a diet that includes plenty of vegetables, fruits, low-fat dairy products, and lean protein. Do not eat a lot of foods that are high in solid fats, added sugars, or sodium. Maintain a healthy weight Body mass index (BMI) is a measurement that can be used to identify possible weight problems. It estimates body fat based on height and weight. Your health care provider can help determine your BMI and help you achieve or maintain a healthy weight. Get regular exercise Get regular exercise. This is one of the most important things you can do for your health. Most adults should: Exercise for at least 150 minutes each week. The exercise should increase your heart rate and make you sweat (moderate-intensity exercise). Do strengthening exercises at least twice a week. This is in addition to the moderate-intensity exercise. Spend less time sitting. Even light physical activity can be beneficial. Watch cholesterol and blood lipids Have your blood tested for lipids and cholesterol at 62 years of age, then have this test every 5 years. You may need to have your cholesterol levels checked more often if: Your lipid or cholesterol levels are high. You are older than 62 years of age. You are at high risk for heart disease. What should I know about cancer screening? Many types of cancers can be detected early and may often be prevented. Depending on your health history and family history, you may need to have cancer screening at various ages. This may include screening for: Colorectal cancer. Prostate cancer. Skin cancer. Lung  cancer. What should I know about heart disease, diabetes, and high blood pressure? Blood pressure and heart disease High blood pressure causes heart disease and increases the risk of stroke. This is more likely to develop in people who have high blood pressure readings, are of African descent, or are overweight. Talk with your health care provider about your target blood pressure readings. Have your blood pressure checked: Every 3-5 years if you are 18-39 years of age. Every year if you are 40 years old or older. If you are between the ages of 65 and 75 and are a current or former smoker, ask your health care provider if you should have a one-time screening for abdominal aortic aneurysm (AAA). Diabetes Have regular diabetes screenings. This checks your fasting blood sugar level. Have the screening done: Once every three years after age 45 if you are at a normal weight and have a low risk for diabetes. More often and at a younger age if you are overweight or have a high risk for diabetes. What should I know about preventing infection? Hepatitis B If you have a higher risk for hepatitis B, you should be screened for this virus. Talk with your health care provider to find out if you are at risk for hepatitis B infection. Hepatitis C Blood testing is recommended for: Everyone born from 1945 through 1965. Anyone with known risk factors for hepatitis C. Sexually transmitted infections (STIs) You should be screened each year for STIs, including gonorrhea and chlamydia, if: You are sexually active and are younger than 62 years of age. You are older than 62 years   of age and your health care provider tells you that you are at risk for this type of infection. Your sexual activity has changed since you were last screened, and you are at increased risk for chlamydia or gonorrhea. Ask your health care provider if you are at risk. Ask your health care provider about whether you are at high risk for HIV.  Your health care provider may recommend a prescription medicine to help prevent HIV infection. If you choose to take medicine to prevent HIV, you should first get tested for HIV. You should then be tested every 3 months for as long as you are taking the medicine. Follow these instructions at home: Lifestyle Do not use any products that contain nicotine or tobacco, such as cigarettes, e-cigarettes, and chewing tobacco. If you need help quitting, ask your health care provider. Do not use street drugs. Do not share needles. Ask your health care provider for help if you need support or information about quitting drugs. Alcohol use Do not drink alcohol if your health care provider tells you not to drink. If you drink alcohol: Limit how much you have to 0-2 drinks a day. Be aware of how much alcohol is in your drink. In the [place] one drink equals one 12 oz bottle of beer (355 mL), one 5 oz glass of wine (148 mL), or one 1 oz glass of hard liquor (44 mL). General instructions Schedule regular health, dental, and eye exams. Stay current with your vaccines. Tell your health care provider if: You often feel depressed. You have ever been abused or do not feel safe at home. Summary Adopting a healthy lifestyle and getting preventive care are important in promoting health and wellness. Follow your health care provider's instructions about healthy diet, exercising, and getting tested or screened for diseases. Follow your health care provider's instructions on monitoring your cholesterol and blood pressure. This information is not intended to replace advice given to you by your health care provider. Make sure you discuss any questions you have with your health care provider.  ?????? ??????? ???????? Colonoscopy, Adult ?????? ??????? ?????? ??? ????? ??? ??? ??????? ??????? ???????. ????? ??? ??????? ????? ???? ???? ???? ???? ?? ?????? ??????. ?? ???? ?????? ?????? ??????? ?? ??????? ???????: ?? ????  ??? ???? ????? ?????? ??????? ??????? ?????????. ???? ????? ?????? ???: ?????? ??? ????? ???? ??????? (??? ????). ????? ?? ????. ??? ?? ????. ???? ?? ?? ??????. ?? ????? ????? ??????? ??? ?????? ???????? ?????? ????????? ??? ?? ???: ???????. ??? ????? ????? ?? ???? ???? ?????? (?????). ????????. ????? ?????. ???? ???? ??????? ?????? ??? ???: ???? ????? ???????? ???? ????? ????. ???? ??????? ???? ????????? ??? ?? ??? ??????????? ???????? ?????? ????? ????????? ???????? ???? ????? ??? ???? ????. ???? ???????? ?????? ???? ??? ?? ????? ??? ??? ?? ??? ????? ????? ??? ?????? ??????? ???????. ???? ???????? ???? ???? ???????. ???? ???????? ???????? ???? ??? ?? ?????? ???. ???? ??????? ?????? ???? ????? ????. ???? ?????? ?????? ???? ????? ????. ??? ???? ?????? ?? ????? ?? ???? ???. ?? ????? ??? ???????? ???? ???? ??? ??????? ???. ??? ???? ??? ???? ??? ???????? ??? ?????: ???. ???? ????? ?? ???????. ???? ??? ?????? ??????? ???? ????? ?? ?? ????? ???????. ??????? ?????. ???? ??? ???? ??????. ???? ???? ??? ??? ???????? ???? ????? ?????? ??????? ???? ??????? ????? ??????? ?????? ????? ????? ??????? ????? ?? ????: ??? ??????? ????? ?????: ???? ?????? ??????? ???? ???????. ???? ???????? ??????? ???????? ??????? ???????? ??????? ?????????. ??? ??????? ???? ???  3 ????: ?? ?????? ??? ???? ????????? ?? ??? ????? ???????. ?? ???? ??? ??????? ???????? ??? ????? ?? ?????? ?????? ?? ?????? ?? ????? ?????? ?? ?????? ?? ????? ???? ?????? ?? ????????? ??????? ??????? ?? ????????? ????????. ???? ??????? ???? ????? ??? ???? ????? ?? ????????. ?? ??? ???????: ?? ?????? ??????? ??????. ?????? ????????? ?? ??? ??????? ??????? ??? ??? ??????? ???????. ?? ???? ?? ???? ????? ??? ??????? ???????? ?? ????? ???? ????? ??? ??????? ??????? ??????. ????? ??????? ??? ???? ?? ????? ?????? ??????? ?? ???? ???? (??????) ?????? ???????: ????? ??? ???????? ??? ??????? ???? ??????? ??????. ??????? ?? ????? ?????? ???  ???????? ??? ????? ??? ????? ???? ????? ?? ???? ????. ?????? ??? ????? ??????? ?????? ?????? ????? ?? ??????? ??????? ?? ????? ?????? ??? ?? ???? ?????? ?????? ??????? ?? ???? ??????. ??? ???? ??????? ???? ??? ??????? (???? ?????) ?? ????? ?????? ??? ???? ???????? ?????? ??????? ??? ???????? ??????? ?????? ????????: ???????? ??????? ?????? ????? ?????? ???? ??? ???????? ??????? ???????? ????? ????? ??? ?????? ???????? ??. ???? ?????? ?????? ??? ???? ???????. ??? ????? ?? ????? ??? ?????? ????? ???????: ???? ?? ????? ???????? ???? ??? ??? 60 ?????. ?? ???? ????? ???????? ??? ????. ???? ????? ??????? ?????? ??? ????? ?????? ??? ??? ?????? ????? ???????? ??? ?? ?????. ??????? ???????? ?? ????? ?? ?????: ????? ?????. ???????? ??? ??????. ??????? ???????? ?????? ???????. ??? ???? ???? ????? ?? ????????. ??????? ???? ???? ??????? ?????? ??: ????? ??????? ?? ???????? ??????? ?? ???????. ???? ????? ??? ????? ??? ??? ?????? ?????? ?????? ?? ????? ?????? ?? ????? ????? ????. ????? ????? ??? ???????? ?????????????. ???? ??????? ???? ?? ???? ????? ???. ?? ?????? ??? ??????? ??? ????? ?? ???? ??????? ??????. ??????? ????? ??????? ???????????? ???????? ????????? ???????? ???? ????? ??? ???? ????. ??????? ???? ?????? ?? ???? ??????? ?????? ?? ??????? ???? ????? ??????? ???????? ?? ??? ??????. ??? ???? ??? ??? ????? ?????? ???? ??? ????????. ??? ?? ??? ??????? ??????? ?? ???????? ?? ???????. ???? ???? ?? ????? ????????  ?? ???? ????? ????? ????? ?? ??? ??????. ?? ????? ??? ??????? ???????: ???? ???????? ??? ????????? (????). ???? ?????? ??????? (???? ?????). ???? ???? (???? ???). ???? ?????? ?? ????? ????? ???. ??? ???? ??? ???? ?? ??? ??????. ??????? ???????? ?????? ?????: ?????? ???? ?? ?? (?? ??? ??????). ?????? ?? ???? ?????. ?????? ???? ??? ??? ??? ????? ????? ??????? ???????. ?? ?????? ??? ?????? ?? ??????? ??? ??? ???????. ??? ???? ??? ?????? ?????? ?? ??????. ???????? ???????? ??????? ???  ????? ??? ??????. ?? ???? ???? ????? ?? ??????? ?????? ??? ?????? (????). ??? ????? ??? ?????? ??? ??????? ?????? ???? ???? ?? ???? ?????? ?????? ??? ???? ??????. ?? ???? ?????? ??? ????? ?????? ?? ???? ????????? ?????? ??????? ???? ????? ??????? ?? ????. ??? ???????? ?? ???????? ????? ????? ???????. ???? ??????? ?? ????? ??????? ???? ????? ??????? ????? ??????? ?????? ???????????. ???? ???? ??? ???????? ????? ?????? ??? ??? ????? ????? ???? ????? ????? ?????? ???????? ?? ??? ??? ????? ???????? ?? ???????. ?? ???? ???? ????? ?? ???? ?? ??????. ?? ???? ??? ????? ??? ???? ??????? ????? ??????? ???? ?? ?????. ???? ??? ?????? ???????? ?? ??? ??????? ???? ??????? ?? ????? ?????. ???? ??????? ???? 24 ???? ??? ???????. ??? ????? ?? ?????? ??? ????? ???????. ???? ???? ??????? ?????? ?? ????? ??????? ?? ????? ???????? ?? ???? ?????? ???????. ???? ?????? ??????? ?????? ??? ????? ??? ??? ??????? ??????? ???????. ???? ??????? ???? ??????? ?????? ????? ????? ?????? ??? ???????. ??? ???? ?? ?????? ???? ???? ?????? ???????? ???????? ??? ??????? ???? ??????? ??????. ???? ?????? ???????? ????? ????? ??????? ?????? ??????? ????? ?????? ??????? ?? ???? ?? ???? ????? ?????? ???? ??? ??????? ???????? ?? ??????? ???????. ??? ????? ?? ??? ????????? ?? ???? ?????? ????????? ???? ?????? ???? ??????? ??????. ???? ?? ?????? ??? ????? ???? ?? ???? ?? ???? ??????? ??????.?   Document Revised: 06/23/2019 Document Reviewed: 06/23/2019 Elsevier Patient Education  2022 [employer]  Document Revised: 12/16/2020 Document Reviewed: 10/01/2018 Elsevier Patient Education  2022 [employer].

## 2021-06-29 NOTE — Progress Notes (Signed)
[facility] [place], [place]  [address] Phone:  [phone]   Fax:  [phone]   Established Patient Office Visit  Subjective:  Patient ID: Manuel Brooks, male    DOB: 09/07/1961  Age: 60 y.o. MRN: [id number]  CC:  Chief Complaint  Patient presents with   Follow-up    3 Month follow up    HPI [name] presents for follow up. He  has a past medical history of Allergy, CAP (community acquired pneumonia) (01/2019), Elevated liver enzymes (10/2019), Exertional shortness of breath (04/2019), GERD (gastroesophageal reflux disease), H. pylori infection (03/2020), Hyperlipidemia (10/2019), Hypertension, Leucopenia, and Vitamin D deficiency (11/2019).    Hypertension Patient is here for follow-up of elevated blood pressure. He is not exercising and is adherent to a low-salt diet. Blood pressure is not monitored at home. Cardiac symptoms: none. Patient denies chest pain, dyspnea, fatigue, lower extremity edema, palpitations, and syncope. Cardiovascular risk factors: dyslipidemia, hypertension, male gender, and obesity (BMI >= 30 kg/m2). Use of agents associated with hypertension: none. History of target organ damage: none.  Dyslipidemia Patient presents for evaluation of lipids. Compliance with treatment thus far has been good. A repeat fasting lipid profile was done. The patient does not use medications that may worsen dyslipidemias (corticosteroids, progestins, anabolic steroids, diuretics, beta-blockers, amiodarone, cyclosporine, olanzapine). The patient exercises occasionally.  The patient is not known to have coexisting coronary artery disease.   Cardiac Risk Factors Age > 45-male, > 55-male:  YES  +1  Smoking:   NO  Sig. family hx of CHD*:  NO  Hypertension:   YES  +1  Diabetes:   NO  HDL < 35:   NO  HDL > 59:   NO  Total: 2  *- Significant family history of Coronary Heart Disease per National Cholesterol Education Program = Myocardial Infarction or  sudden death at less than 39 years old in  father or other 1st-degree male relative, or less than 37 years old in mother or  other 1st-degree male relative.  He continues to have urinary symptoms. He reports that when he holds his urine for a long period. He has noticed problems. He reports that after a few days this improves. He is taking Tamusloin and feels like it is effective. He voids only once during the night. He denies any hematuria, hesitancy or frequency.  He reports he has taken Baobab for his cholesterol but he also continues to use the atorvastatin.   He has a history of hemorrhoids. He has used Preparation H and feels like this more effective than Anusol.   Past Medical History:  Diagnosis Date   Allergy    CAP (community acquired pneumonia) 01/2019   Elevated liver enzymes 10/2019   Exertional shortness of breath 04/2019   GERD (gastroesophageal reflux disease)    H. pylori infection 03/2020   Hyperlipidemia 10/2019   Hypertension    Leucopenia    Vitamin D deficiency 11/2019    Past Surgical History:  Procedure Laterality Date   BIOPSY BONE MARROW  09/21/2011       OTHER SURGICAL HISTORY     was unable to have childern and had surgery so he could have children 1990    History reviewed. No pertinent family history.  Social History   Socioeconomic History   Marital status: Married    Spouse name: Not on file   Number of children: Not on file   Years of education: Not on file  Highest education level: Not on file  Occupational History   Not on file  Tobacco Use   Smoking status: Former    Packs/day: 0.50    Types: Cigarettes    Start date: 10/22/1988    Quit date: 10/22/1998    Years since quitting: 22.7   Smokeless tobacco: Never  Vaping Use   Vaping Use: Never used  Substance and Sexual Activity   Alcohol use: No   Drug use: No    Frequency: 1.0 times per week   Sexual activity: Yes  Other Topics Concern   Not on file  Social History  Narrative   Not on file   Social Determinants of Health   Financial Resource Strain: Not on file  Food Insecurity: Not on file  Transportation Needs: Not on file  Physical Activity: Not on file  Stress: Not on file  Social Connections: Not on file  Intimate Partner Violence: Not on file    Outpatient Medications Prior to Visit  Medication Sig Dispense Refill   cetirizine (ZYRTEC) 10 MG tablet Take 1 tablet (10 mg total) by mouth daily. 90 tablet 3   fluticasone (FLONASE) 50 MCG/ACT nasal spray Place 2 sprays into both nostrils daily. 9.9 g 11   hydrocortisone (ANUSOL-HC) 2.5 % rectal cream Place 1 application rectally 2 (two) times daily. 30 g 3   ibuprofen (ADVIL) 800 MG tablet TAKE 1 TABLET (800 MG TOTAL) BY MOUTH EVERY 8 (EIGHT) HOURS AS NEEDED. 120 tablet 6   nitroGLYCERIN (NITRODUR - DOSED IN MG/24 HR) 0.2 mg/hr patch Apply 1/4 patch to the affected area every 24 hours 30 patch 11   vitamin C (ASCORBIC ACID) 250 MG tablet Take 1 tablet (250 mg total) by mouth daily. 30 tablet 6   vitamin E 180 MG (400 UNITS) capsule Take 1 capsule (400 Units total) by mouth daily. 30 capsule 6   Zinc 100 MG TABS Take 100 mg by mouth daily.     albuterol (VENTOLIN HFA) 108 (90 Base) MCG/ACT inhaler Inhale 2 puffs into the lungs every 6 (six) hours as needed for wheezing or shortness of breath. 18 g 6   atorvastatin (LIPITOR) 10 MG tablet TAKE 1 TABLET(10 MG) BY MOUTH DAILY 90 tablet 1   hydrocortisone cream 0.5 % Apply 1 application topically 2 (two) times daily. 30 g 3   lisinopril (ZESTRIL) 5 MG tablet Take 1 tablet (5 mg total) by mouth daily. 30 tablet 11   tamsulosin (FLOMAX) 0.4 MG CAPS capsule Take 1 capsule (0.4 mg total) by mouth daily. 30 capsule 11   No facility-administered medications prior to visit.    No Known Allergies  ROS Review of Systems    Objective:    Physical Exam Constitutional:      General: He is not in acute distress. HENT:     Head: Normocephalic and  atraumatic.  Cardiovascular:     Rate and Rhythm: Normal rate and regular rhythm.     Pulses: Normal pulses.     Heart sounds: Normal heart sounds.  Pulmonary:     Effort: Pulmonary effort is normal.     Breath sounds: Normal breath sounds.  Abdominal:     Palpations: Abdomen is soft.     Comments: hypoactive  Musculoskeletal:        General: Normal range of motion.     Cervical back: Normal range of motion.  Skin:    General: Skin is warm.     Capillary Refill: Capillary refill takes less  than 2 seconds.  Neurological:     General: No focal deficit present.     Mental Status: He is alert and oriented to person, place, and time.  Psychiatric:        Mood and Affect: Mood normal.        Behavior: Behavior normal.        Thought Content: Thought content normal.        Judgment: Judgment normal.   BP 134/88   Pulse 66   Temp 98 F (36.7 C)   Ht 5' 2.5" (1.588 m)   Wt 186 lb 0.6 oz (84.4 kg)   SpO2 100%   BMI 33.48 kg/m  Wt Readings from Last 3 Encounters:  06/29/21 186 lb 0.6 oz (84.4 kg)  01/23/21 183 lb (83 kg)  12/12/20 187 lb 12.8 oz (85.2 kg)     There are no preventive care reminders to display for this patient.   There are no preventive care reminders to display for this patient.  Lab Results  Component Value Date   TSH 0.715 11/20/2019   Lab Results  Component Value Date   WBC 3.6 01/23/2021   HGB 15.0 01/23/2021   HCT 46.4 01/23/2021   MCV 82 01/23/2021   PLT 256 01/23/2021   Lab Results  Component Value Date   NA 138 04/25/2021   K 4.4 04/25/2021   CO2 24 04/25/2021   GLUCOSE 98 04/25/2021   BUN 11 04/25/2021   CREATININE 1.01 04/25/2021   BILITOT 0.4 04/25/2021   ALKPHOS 79 04/25/2021   AST 29 04/25/2021   ALT 21 04/25/2021   PROT 7.1 04/25/2021   ALBUMIN 4.5 04/25/2021   CALCIUM 9.1 04/25/2021   ANIONGAP 12 10/16/2019   EGFR 84 04/25/2021   Lab Results  Component Value Date   CHOL 151 04/25/2021   Lab Results  Component Value  Date   HDL 50 04/25/2021   Lab Results  Component Value Date   LDLCALC 86 04/25/2021   Lab Results  Component Value Date   TRIG 76 04/25/2021   Lab Results  Component Value Date   CHOLHDL 3.0 04/25/2021   Lab Results  Component Value Date   HGBA1C 5.6 11/20/2019      Assessment & Plan:   Problem List Items Addressed This Visit       Cardiovascular and Mediastinum   Hypertension - Primary Encouraged on going compliance with current medication regimen Encouraged home monitoring and recording BP <130/80 Eating a heart-healthy diet with less salt Encouraged regular physical activity  Recommend Weight loss   Relevant Medications   atorvastatin (LIPITOR) 10 MG tablet   lisinopril (ZESTRIL) 5 MG tablet     Other      Dyspnea on exertion Stable    Relevant Medications   albuterol (VENTOLIN HFA) 108 (90 Base) MCG/ACT inhaler   Mixed hyperlipidemia Stable Continue with current regimen.  No changes warranted. Good patient compliance.    Relevant Medications   atorvastatin (LIPITOR) 10 MG tablet   lisinopril (ZESTRIL) 5 MG tablet   Other Visit Diagnoses     Essential hypertension       Relevant Medications   atorvastatin (LIPITOR) 10 MG tablet   lisinopril (ZESTRIL) 5 MG tablet   Urinary urgency        Relevant Medications   tamsulosin (FLOMAX) 0.4 MG CAPS capsule   Other Relevant Orders   POCT urinalysis dipstick   History of changes in urinary output    Persistent  Would  like further evaluation    Relevant Orders   Ambulatory referral to Urology      Hemorrhoids, unspecified hemorrhoid type     Stable    Relevant Medications   atorvastatin (LIPITOR) 10 MG tablet   lisinopril (ZESTRIL) 5 MG tablet       Meds ordered this encounter  Medications   albuterol (VENTOLIN HFA) 108 (90 Base) MCG/ACT inhaler    Sig: Inhale 2 puffs into the lungs every 6 (six) hours as needed for wheezing or shortness of breath.    Dispense:  18 g    Refill:  6    Order  Specific Question:   Supervising Provider    Answer:   Tresa Garter [[id number]]   atorvastatin (LIPITOR) 10 MG tablet    Sig: Take 1 tablet (10 mg total) by mouth daily.    Dispense:  30 tablet    Refill:  11    Needs appointment    Order Specific Question:   Supervising Provider    Answer:   Tresa Garter [[id number]]   hydrocortisone cream (PREPARATION H) 1 %    Sig: Apply 1 application topically 2 (two) times daily.    Dispense:  30 g    Refill:  11    Order Specific Question:   Supervising Provider    Answer:   Tresa Garter [[id number]]   lisinopril (ZESTRIL) 5 MG tablet    Sig: Take 1 tablet (5 mg total) by mouth daily.    Dispense:  30 tablet    Refill:  11    Order Specific Question:   Supervising Provider    Answer:   Tresa Garter [[id number]]   tamsulosin (FLOMAX) 0.4 MG CAPS capsule    Sig: Take 1 capsule (0.4 mg total) by mouth daily.    Dispense:  30 capsule    Refill:  11    Order Specific Question:   Supervising Provider    Answer:   Tresa Garter [[id number]]    Follow-up: Return in about 3 months (around 09/28/2021) for Follow up HTN [id number].    Vevelyn Francois, NP

## 2021-07-26 ENCOUNTER — Ambulatory Visit (INDEPENDENT_AMBULATORY_CARE_PROVIDER_SITE_OTHER): Payer: Medicaid Other | Admitting: Nurse Practitioner

## 2021-07-26 ENCOUNTER — Other Ambulatory Visit: Payer: Self-pay

## 2021-07-26 ENCOUNTER — Encounter: Payer: Self-pay | Admitting: Nurse Practitioner

## 2021-07-26 VITALS — BP 131/88 | HR 65 | Temp 97.1°F | Ht 62.5 in | Wt 186.1 lb

## 2021-07-26 DIAGNOSIS — R1010 Upper abdominal pain, unspecified: Secondary | ICD-10-CM

## 2021-07-26 MED ORDER — PANTOPRAZOLE SODIUM 40 MG PO TBEC: 40.0000 mg | DELAYED_RELEASE_TABLET | Freq: Every day | ORAL | 0 refills | Status: DC

## 2021-07-26 MED ORDER — PANTOPRAZOLE SODIUM 40 MG PO TBEC
40.0000 mg | DELAYED_RELEASE_TABLET | Freq: Every day | ORAL | 0 refills | Status: DC
  Filled 2021-07-26: qty 30, 30d supply, fill #0

## 2021-07-26 NOTE — Progress Notes (Signed)
[place], [place]  [address] Phone:  [phone]   Fax:  [phone] Subjective:   Patient ID: Manuel Brooks, male    DOB: [birth year], 60 y.o.   MRN: [id number]  Chief Complaint  Patient presents with   Abdominal Pain    Past 2 days, has taken acid reflux medication with some relief.     Manuel Brooks 60 y.o. male with history of CAP, elevated liver enzymes, GERD, H.Pylori infection, hyperlipidemia and hypertension to the [facility] for upper abdominal pain. Patient states that he has had pain in mid upper abdomen for several months, that has worsened over the past 2 days. Has been taking Nexium with mild, temporary improvement. States that pain does not increase with meals, unless he overeats. Endorses eating a mild diet, with no fried or spicy foods. Denies use of substances, cigarettes or alcohol. Currently rates pain 2-3/10 and describes as burning. Pain is worst in the morning and at  night when laying in his back or right side. As the day progresses, pain resolves, often times without intervention . Last BM, this morning, normal. Denies any nausea/ vomiting or diarrhea. Denies any recent trauma or injury. Denies any fever. Denies any fatigue, chest pain, shortness of breath, HA or dizziness. Denies any blurred vision, numbness or tingling.   Past Medical History:  Diagnosis Date   Allergy    CAP (community acquired pneumonia) 01/2019   Elevated liver enzymes 10/2019   Exertional shortness of breath 04/2019   GERD (gastroesophageal reflux disease)    H. pylori infection 03/2020   Hyperlipidemia 10/2019   Hypertension    Leucopenia    Vitamin D deficiency 11/2019    Past Surgical History:  Procedure Laterality Date   BIOPSY BONE MARROW  09/21/2011       OTHER SURGICAL HISTORY     was unable to have childern and had surgery so he could have children 1990    No family history on file.  Social History   Socioeconomic History   Marital  status: Married    Spouse name: Not on file   Number of children: Not on file   Years of education: Not on file   Highest education level: Not on file  Occupational History   Not on file  Tobacco Use   Smoking status: Former    Packs/day: 0.50    Types: Cigarettes    Start date: 10/22/1988    Quit date: 10/22/1998    Years since quitting: 22.7   Smokeless tobacco: Never  Vaping Use   Vaping Use: Never used  Substance and Sexual Activity   Alcohol use: No   Drug use: No    Frequency: 1.0 times per week   Sexual activity: Yes  Other Topics Concern   Not on file  Social History Narrative   Not on file   Social Determinants of Health   Financial Resource Strain: Not on file  Food Insecurity: Not on file  Transportation Needs: Not on file  Physical Activity: Not on file  Stress: Not on file  Social Connections: Not on file  Intimate Partner Violence: Not on file    Outpatient Medications Prior to Visit  Medication Sig Dispense Refill   albuterol (VENTOLIN HFA) 108 (90 Base) MCG/ACT inhaler Inhale 2 puffs into the lungs every 6 (six) hours as needed for wheezing or shortness of breath. 18 g 6   atorvastatin (LIPITOR) 10 MG tablet Take 1 tablet (10  mg total) by mouth daily. 30 tablet 11   cetirizine (ZYRTEC) 10 MG tablet Take 1 tablet (10 mg total) by mouth daily. 90 tablet 3   fluticasone (FLONASE) 50 MCG/ACT nasal spray Place 2 sprays into both nostrils daily. 9.9 g 11   hydrocortisone (ANUSOL-HC) 2.5 % rectal cream Place 1 application rectally 2 (two) times daily. 30 g 3   hydrocortisone cream (PREPARATION H) 1 % Apply 1 application topically 2 (two) times daily. 30 g 11   ibuprofen (ADVIL) 800 MG tablet TAKE 1 TABLET (800 MG TOTAL) BY MOUTH EVERY 8 (EIGHT) HOURS AS NEEDED. 120 tablet 6   lisinopril (ZESTRIL) 5 MG tablet Take 1 tablet (5 mg total) by mouth daily. 30 tablet 11   nitroGLYCERIN (NITRODUR - DOSED IN MG/24 HR) 0.2 mg/hr patch Apply 1/4 patch to the affected area  every 24 hours 30 patch 11   tamsulosin (FLOMAX) 0.4 MG CAPS capsule Take 1 capsule (0.4 mg total) by mouth daily. 30 capsule 11   vitamin C (ASCORBIC ACID) 250 MG tablet Take 1 tablet (250 mg total) by mouth daily. 30 tablet 6   vitamin E 180 MG (400 UNITS) capsule Take 1 capsule (400 Units total) by mouth daily. 30 capsule 6   Zinc 100 MG TABS Take 100 mg by mouth daily.     No facility-administered medications prior to visit.    No Known Allergies  Review of Systems  Constitutional:  Negative for chills, fever and malaise/fatigue.  Respiratory:  Negative for cough and shortness of breath.   Cardiovascular:  Negative for chest pain, palpitations and leg swelling.  Gastrointestinal:  Positive for abdominal pain. Negative for blood in stool, constipation, diarrhea, nausea and vomiting.  Genitourinary: Negative.   Skin: Negative.   Psychiatric/Behavioral:  Negative for depression. The patient is not nervous/anxious.   All other systems reviewed and are negative.     Objective:    Physical Exam Vitals reviewed.  Constitutional:      General: He is not in acute distress.    Appearance: Normal appearance. He is obese.  HENT:     Head: Normocephalic.     Mouth/Throat:     Mouth: Mucous membranes are moist.  Cardiovascular:     Rate and Rhythm: Normal rate and regular rhythm.     Pulses: Normal pulses.     Heart sounds: Normal heart sounds.     Comments: No obvious peripheral edema Pulmonary:     Effort: Pulmonary effort is normal.     Breath sounds: Normal breath sounds.  Abdominal:     General: Abdomen is flat. Bowel sounds are increased. There is no distension.     Palpations: Abdomen is soft.     Tenderness: There is abdominal tenderness in the epigastric area. There is no right CVA tenderness or left CVA tenderness.     Hernia: No hernia is present.  Skin:    General: Skin is warm and dry.     Capillary Refill: Capillary refill takes less than 2 seconds.  Neurological:      General: No focal deficit present.     Mental Status: He is alert and oriented to person, place, and time.  Psychiatric:        Mood and Affect: Mood normal.        Behavior: Behavior normal.        Thought Content: Thought content normal.        Judgment: Judgment normal.    BP 131/88  Pulse 65   Temp (!) 97.1 F (36.2 C)   Ht 5' 2.5" (1.588 m)   Wt 186 lb 0.8 oz (84.4 kg)   SpO2 100%   BMI 33.49 kg/m  Wt Readings from Last 3 Encounters:  07/26/21 186 lb 0.8 oz (84.4 kg)  06/29/21 186 lb 0.6 oz (84.4 kg)  01/23/21 183 lb (83 kg)    Immunization History  Administered Date(s) Administered   Influenza,inj,Quad PF,6+ Mos 07/22/2014, 08/02/2016, 10/10/2018, 09/12/2020   Tdap 05/20/2018    Diabetic Foot Exam - Simple   No data filed     Lab Results  Component Value Date   TSH 0.715 11/20/2019   Lab Results  Component Value Date   WBC 3.6 01/23/2021   HGB 15.0 01/23/2021   HCT 46.4 01/23/2021   MCV 82 01/23/2021   PLT 256 01/23/2021   Lab Results  Component Value Date   NA 138 04/25/2021   K 4.4 04/25/2021   CO2 24 04/25/2021   GLUCOSE 98 04/25/2021   BUN 11 04/25/2021   CREATININE 1.01 04/25/2021   BILITOT 0.4 04/25/2021   ALKPHOS 79 04/25/2021   AST 29 04/25/2021   ALT 21 04/25/2021   PROT 7.1 04/25/2021   ALBUMIN 4.5 04/25/2021   CALCIUM 9.1 04/25/2021   ANIONGAP 12 10/16/2019   EGFR 84 04/25/2021   Lab Results  Component Value Date   CHOL 151 04/25/2021   CHOL 145 01/23/2021   CHOL 233 (H) 11/20/2019   Lab Results  Component Value Date   HDL 50 04/25/2021   HDL 51 01/23/2021   HDL 51 11/20/2019   Lab Results  Component Value Date   LDLCALC 86 04/25/2021   LDLCALC 81 01/23/2021   LDLCALC 167 (H) 11/20/2019   Lab Results  Component Value Date   TRIG 76 04/25/2021   TRIG 61 01/23/2021   TRIG 87 11/20/2019   Lab Results  Component Value Date   CHOLHDL 3.0 04/25/2021   CHOLHDL 2.8 01/23/2021   CHOLHDL 4.6 11/20/2019   Lab  Results  Component Value Date   HGBA1C 5.6 11/20/2019   HGBA1C 5.0 10/10/2018       Assessment & Plan:  Per chart review, patient was evaluated and treated for similar abdominal pain 8 mths ago, with completion of abdominal  US. Results were significant for chololithiasis.  Patient has history of positive H.Pylori screen 1 yr ago. Today, based on history and physical, concerned for low suspicion for cholecystitis versus cholelithiasis with flare versus GERD versus H. pylori versus other infectious process versus other intra-abdominal etiology.  Orders placed and discussed as below: Problem List Items Addressed This Visit   None Visit Diagnoses     Pain of upper abdomen    -  Primary   Relevant Medications   pantoprazole (PROTONIX) 40 MG tablet   Other Relevant Orders   CBC with Differential/Platelet   Comprehensive metabolic panel   Lipase   H. pylori breath test   CT Abdomen Pelvis W Contrast Discussed diet options/ changes with patient      Follow up in 1 mth for reevaluation of symptoms, sooner as needed   I am having Ranee Gosselin maintain his nitroGLYCERIN, hydrocortisone, Zinc, vitamin C, vitamin E, ibuprofen, cetirizine, fluticasone, albuterol, atorvastatin, hydrocortisone cream, lisinopril, tamsulosin, and pantoprazole.  Meds ordered this encounter  Medications   DISCONTD: pantoprazole (PROTONIX) 40 MG tablet    Sig: Take 1 tablet (40 mg total) by mouth daily.    Dispense:  30  tablet    Refill:  0   pantoprazole (PROTONIX) 40 MG tablet    Sig: Take 1 tablet (40 mg total) by mouth daily.    Dispense:  30 tablet    Refill:  0     Teena Dunk, NP

## 2021-07-26 NOTE — Patient Instructions (Signed)
You were seen today in the [facility] for abdominal pain. Labs were collected, results will be available via MyChart or, if abnormal, you will be contacted by clinic staff. You were prescribed medications, please take as directed. Please follow up in 1 mth for reevaluation of symptoms.

## 2021-07-27 LAB — COMPREHENSIVE METABOLIC PANEL
ALT: 18 IU/L (ref 0–44)
AST: 26 IU/L (ref 0–40)
Albumin/Globulin Ratio: 1.5 (ref 1.2–2.2)
Albumin: 4.5 g/dL (ref 3.8–4.8)
Alkaline Phosphatase: 80 IU/L (ref 44–121)
BUN/Creatinine Ratio: 12 (ref 10–24)
BUN: 13 mg/dL (ref 8–27)
Bilirubin Total: 0.4 mg/dL (ref 0.0–1.2)
CO2: 25 mmol/L (ref 20–29)
Calcium: 9.6 mg/dL (ref 8.6–10.2)
Chloride: 102 mmol/L (ref 96–106)
Creatinine, Ser: 1.05 mg/dL (ref 0.76–1.27)
Globulin, Total: 3.1 g/dL (ref 1.5–4.5)
Glucose: 90 mg/dL (ref 70–99)
Potassium: 4.4 mmol/L (ref 3.5–5.2)
Sodium: 140 mmol/L (ref 134–144)
Total Protein: 7.6 g/dL (ref 6.0–8.5)
eGFR: 80 mL/min/{1.73_m2} (ref >59)

## 2021-07-27 LAB — CBC WITH DIFFERENTIAL/PLATELET
Basophils Absolute: 0 10*3/uL (ref 0.0–0.2)
Basos: 1 %
EOS (ABSOLUTE): 0.3 10*3/uL (ref 0.0–0.4)
Eos: 8 %
Hematocrit: 46.1 % (ref 37.5–51.0)
Hemoglobin: 15.6 g/dL (ref 13.0–17.7)
Immature Grans (Abs): 0 10*3/uL (ref 0.0–0.1)
Immature Granulocytes: 0 %
Lymphocytes Absolute: 1.6 10*3/uL (ref 0.7–3.1)
Lymphs: 50 %
MCH: 27.3 pg (ref 26.6–33.0)
MCHC: 33.8 g/dL (ref 31.5–35.7)
MCV: 81 fL (ref 79–97)
Monocytes Absolute: 0.3 10*3/uL (ref 0.1–0.9)
Monocytes: 10 %
Neutrophils Absolute: 1 10*3/uL — ABNORMAL LOW (ref 1.4–7.0)
Neutrophils: 31 %
Platelets: 263 10*3/uL (ref 150–450)
RBC: 5.72 x10E6/uL (ref 4.14–5.80)
RDW: 13.6 % (ref 11.6–15.4)
WBC: 3.2 10*3/uL — ABNORMAL LOW (ref 3.4–10.8)

## 2021-07-27 LAB — LIPASE: Lipase: 36 U/L (ref 13–78)

## 2021-07-28 ENCOUNTER — Other Ambulatory Visit (HOSPITAL_COMMUNITY): Payer: Self-pay

## 2021-07-31 ENCOUNTER — Other Ambulatory Visit: Payer: Self-pay

## 2021-07-31 ENCOUNTER — Ambulatory Visit (HOSPITAL_COMMUNITY)
Admission: RE | Admit: 2021-07-31 | Discharge: 2021-07-31 | Disposition: A | Discharge status: Home / Self Care | Payer: Medicaid Other | Source: Ambulatory Visit | Attending: Nurse Practitioner | Admitting: Nurse Practitioner

## 2021-07-31 DIAGNOSIS — R109 Unspecified abdominal pain: Secondary | ICD-10-CM | POA: Diagnosis not present

## 2021-07-31 DIAGNOSIS — R1010 Upper abdominal pain, unspecified: Secondary | ICD-10-CM | POA: Diagnosis not present

## 2021-07-31 MED ORDER — IOHEXOL 350 MG/ML SOLN
80.0000 mL | Freq: Once | INTRAVENOUS | Status: AC | PRN
  Administered 2021-07-31: 80 mL via INTRAVENOUS

## 2021-08-09 ENCOUNTER — Other Ambulatory Visit: Payer: Self-pay

## 2021-08-09 ENCOUNTER — Other Ambulatory Visit: Payer: Medicaid Other

## 2021-08-09 DIAGNOSIS — I10 Essential (primary) hypertension: Secondary | ICD-10-CM

## 2021-08-09 DIAGNOSIS — R1010 Upper abdominal pain, unspecified: Secondary | ICD-10-CM | POA: Diagnosis not present

## 2021-08-10 LAB — COMP. METABOLIC PANEL (12)
AST: 23 IU/L (ref 0–40)
Albumin/Globulin Ratio: 1.6 (ref 1.2–2.2)
Albumin: 4.4 g/dL (ref 3.8–4.8)
Alkaline Phosphatase: 77 IU/L (ref 44–121)
BUN/Creatinine Ratio: 16 (ref 10–24)
BUN: 17 mg/dL (ref 8–27)
Bilirubin Total: 0.3 mg/dL (ref 0.0–1.2)
Calcium: 9.4 mg/dL (ref 8.6–10.2)
Chloride: 103 mmol/L (ref 96–106)
Creatinine, Ser: 1.08 mg/dL (ref 0.76–1.27)
Globulin, Total: 2.8 g/dL (ref 1.5–4.5)
Glucose: 94 mg/dL (ref 70–99)
Potassium: 4.4 mmol/L (ref 3.5–5.2)
Sodium: 141 mmol/L (ref 134–144)
Total Protein: 7.2 g/dL (ref 6.0–8.5)
eGFR: 78 mL/min/{1.73_m2} (ref >59)

## 2021-08-11 LAB — H. PYLORI BREATH TEST: H pylori Breath Test: NEGATIVE

## 2021-08-23 ENCOUNTER — Other Ambulatory Visit: Payer: Self-pay | Admitting: Nurse Practitioner

## 2021-08-23 DIAGNOSIS — R1010 Upper abdominal pain, unspecified: Secondary | ICD-10-CM

## 2021-08-25 ENCOUNTER — Ambulatory Visit (INDEPENDENT_AMBULATORY_CARE_PROVIDER_SITE_OTHER): Payer: Medicaid Other | Admitting: Nurse Practitioner

## 2021-08-25 ENCOUNTER — Encounter: Payer: Self-pay | Admitting: Nurse Practitioner

## 2021-08-25 ENCOUNTER — Other Ambulatory Visit: Payer: Self-pay

## 2021-08-25 VITALS — BP 141/94 | HR 67 | Temp 97.9°F | Ht 62.5 in | Wt 181.0 lb

## 2021-08-25 DIAGNOSIS — Z1211 Encounter for screening for malignant neoplasm of colon: Secondary | ICD-10-CM

## 2021-08-25 DIAGNOSIS — R1010 Upper abdominal pain, unspecified: Secondary | ICD-10-CM

## 2021-08-25 MED ORDER — PANTOPRAZOLE SODIUM 40 MG PO TBEC
DELAYED_RELEASE_TABLET | ORAL | 3 refills | Status: DC
  Filled 2021-08-25: qty 30, 30d supply, fill #0

## 2021-08-25 NOTE — Progress Notes (Signed)
[place], [place]  [address] Phone:  [phone]   Fax:  [phone] Subjective:   Patient ID: Manuel Brooks, male    DOB: 1960-10-28, 60 y.o.   MRN: [id number]  Chief Complaint  Patient presents with   Follow-up    1 month follow up, no longer have as much abdominal pain   HPI Manuel Brooks 60 y.o. male  has a past medical history of Allergy, CAP (community acquired pneumonia) (01/2019), Elevated liver enzymes (10/2019), Exertional shortness of breath (04/2019), GERD (gastroesophageal reflux disease), H. pylori infection (03/2020), Hyperlipidemia (10/2019), Hypertension, Leucopenia, and Vitamin D deficiency (11/2019).  To the [facility] for reevaluation of abdominal pain. Patient states that  since previous visit, abdominal pain has mostly resolved. States that he has been compliant with medications and is monitoring diet at home. Denies any pain during visit today. Denies any diarrhea or nausea/ vomiting. Denies any fever. Requesting refill of previously prescribed medication, Protonix. Denies any other complaints today.   Denies any fatigue, chest pain, shortness of breath, HA or dizziness. Denies any blurred vision, numbness or tingling.   Past Medical History:  Diagnosis Date   Allergy    CAP (community acquired pneumonia) 01/2019   Elevated liver enzymes 10/2019   Exertional shortness of breath 04/2019   GERD (gastroesophageal reflux disease)    H. pylori infection 03/2020   Hyperlipidemia 10/2019   Hypertension    Leucopenia    Vitamin D deficiency 11/2019    Past Surgical History:  Procedure Laterality Date   BIOPSY BONE MARROW  09/21/2011       OTHER SURGICAL HISTORY     was unable to have childern and had surgery so he could have children 1990    No family history on file.  Social History   Socioeconomic History   Marital status: Married    Spouse name: Not on file   Number of children: Not on file   Years of education: Not on  file   Highest education level: Not on file  Occupational History   Not on file  Tobacco Use   Smoking status: Former    Packs/day: 0.50    Types: Cigarettes    Start date: 10/22/1988    Quit date: 10/22/1998    Years since quitting: 22.8   Smokeless tobacco: Never  Vaping Use   Vaping Use: Never used  Substance and Sexual Activity   Alcohol use: No   Drug use: No    Frequency: 1.0 times per week   Sexual activity: Yes  Other Topics Concern   Not on file  Social History Narrative   Not on file   Social Determinants of Health   Financial Resource Strain: Not on file  Food Insecurity: Not on file  Transportation Needs: Not on file  Physical Activity: Not on file  Stress: Not on file  Social Connections: Not on file  Intimate Partner Violence: Not on file    Outpatient Medications Prior to Visit  Medication Sig Dispense Refill   albuterol (VENTOLIN HFA) 108 (90 Base) MCG/ACT inhaler Inhale 2 puffs into the lungs every 6 (six) hours as needed for wheezing or shortness of breath. 18 g 6   atorvastatin (LIPITOR) 10 MG tablet Take 1 tablet (10 mg total) by mouth daily. 30 tablet 11   cetirizine (ZYRTEC) 10 MG tablet Take 1 tablet (10 mg total) by mouth daily. 90 tablet 3   fluticasone (FLONASE) 50 MCG/ACT nasal spray  Place 2 sprays into both nostrils daily. 9.9 g 11   hydrocortisone (ANUSOL-HC) 2.5 % rectal cream Place 1 application rectally 2 (two) times daily. 30 g 3   hydrocortisone cream (PREPARATION H) 1 % Apply 1 application topically 2 (two) times daily. 30 g 11   ibuprofen (ADVIL) 800 MG tablet TAKE 1 TABLET (800 MG TOTAL) BY MOUTH EVERY 8 (EIGHT) HOURS AS NEEDED. 120 tablet 6   lisinopril (ZESTRIL) 5 MG tablet Take 1 tablet (5 mg total) by mouth daily. 30 tablet 11   nitroGLYCERIN (NITRODUR - DOSED IN MG/24 HR) 0.2 mg/hr patch Apply 1/4 patch to the affected area every 24 hours 30 patch 11   tamsulosin (FLOMAX) 0.4 MG CAPS capsule Take 1 capsule (0.4 mg total) by mouth  daily. 30 capsule 11   vitamin C (ASCORBIC ACID) 250 MG tablet Take 1 tablet (250 mg total) by mouth daily. 30 tablet 6   vitamin E 180 MG (400 UNITS) capsule Take 1 capsule (400 Units total) by mouth daily. 30 capsule 6   Zinc 100 MG TABS Take 100 mg by mouth daily.     pantoprazole (PROTONIX) 40 MG tablet TAKE 1 TABLET(40 MG) BY MOUTH DAILY 30 tablet 0   No facility-administered medications prior to visit.    No Known Allergies  Review of Systems  Constitutional:  Negative for chills, fever and malaise/fatigue.  Respiratory:  Negative for cough and shortness of breath.   Cardiovascular:  Negative for chest pain, palpitations and leg swelling.  Gastrointestinal:  Positive for abdominal pain. Negative for blood in stool, constipation, diarrhea, heartburn, nausea and vomiting.  Genitourinary: Negative.   Skin: Negative.   Neurological: Negative.   Psychiatric/Behavioral:  Negative for depression. The patient is not nervous/anxious.   All other systems reviewed and are negative.     Objective:    Physical Exam Constitutional:      General: He is not in acute distress.    Appearance: Normal appearance.  HENT:     Head: Normocephalic.  Cardiovascular:     Rate and Rhythm: Normal rate and regular rhythm.     Pulses: Normal pulses.     Heart sounds: Normal heart sounds.     Comments: No obvious peripheral edema Pulmonary:     Effort: Pulmonary effort is normal.     Breath sounds: Normal breath sounds.  Abdominal:     General: Abdomen is flat. Bowel sounds are normal. There is no distension.     Palpations: Abdomen is soft. There is no mass.     Tenderness: There is no abdominal tenderness. There is no right CVA tenderness, left CVA tenderness, guarding or rebound.     Hernia: No hernia is present.  Skin:    General: Skin is warm and dry.     Capillary Refill: Capillary refill takes less than 2 seconds.  Neurological:     General: No focal deficit present.     Mental Status:  He is alert and oriented to person, place, and time.  Psychiatric:        Mood and Affect: Mood normal.        Behavior: Behavior normal.        Thought Content: Thought content normal.        Judgment: Judgment normal.    BP (!) 141/94 (BP Location: Left Arm, Patient Position: Sitting)   Pulse 67   Temp 97.9 F (36.6 C)   Ht 5' 2.5" (1.588 m)   Wt 181 lb (82.1  kg)   SpO2 100%   BMI 32.58 kg/m  Wt Readings from Last 3 Encounters:  08/25/21 181 lb (82.1 kg)  07/26/21 186 lb 0.8 oz (84.4 kg)  06/29/21 186 lb 0.6 oz (84.4 kg)    Immunization History  Administered Date(s) Administered   Influenza,inj,Quad PF,6+ Mos 07/22/2014, 08/02/2016, 10/10/2018, 09/12/2020   Tdap 05/20/2018    Diabetic Foot Exam - Simple   No data filed     Lab Results  Component Value Date   TSH 0.715 11/20/2019   Lab Results  Component Value Date   WBC 3.2 (L) 07/26/2021   HGB 15.6 07/26/2021   HCT 46.1 07/26/2021   MCV 81 07/26/2021   PLT 263 07/26/2021   Lab Results  Component Value Date   NA 141 08/09/2021   K 4.4 08/09/2021   CO2 25 07/26/2021   GLUCOSE 94 08/09/2021   BUN 17 08/09/2021   CREATININE 1.08 08/09/2021   BILITOT 0.3 08/09/2021   ALKPHOS 77 08/09/2021   AST 23 08/09/2021   ALT 18 07/26/2021   PROT 7.2 08/09/2021   ALBUMIN 4.4 08/09/2021   CALCIUM 9.4 08/09/2021   ANIONGAP 12 10/16/2019   EGFR 78 08/09/2021   Lab Results  Component Value Date   CHOL 151 04/25/2021   CHOL 145 01/23/2021   CHOL 233 (H) 11/20/2019   Lab Results  Component Value Date   HDL 50 04/25/2021   HDL 51 01/23/2021   HDL 51 11/20/2019   Lab Results  Component Value Date   LDLCALC 86 04/25/2021   LDLCALC 81 01/23/2021   LDLCALC 167 (H) 11/20/2019   Lab Results  Component Value Date   TRIG 76 04/25/2021   TRIG 61 01/23/2021   TRIG 87 11/20/2019   Lab Results  Component Value Date   CHOLHDL 3.0 04/25/2021   CHOLHDL 2.8 01/23/2021   CHOLHDL 4.6 11/20/2019   Lab Results   Component Value Date   HGBA1C 5.6 11/20/2019   HGBA1C 5.0 10/10/2018       Assessment & Plan:   Problem List Items Addressed This Visit   None Visit Diagnoses     Pain of upper abdomen    -  Primary   Relevant Medications   pantoprazole (PROTONIX) 40 MG tablet, refilled  Encouraged continued adherence to diet modifications Informed of referral to GI for recommended colonoscopy  Discussed return/ ED precautions   Screening for colon cancer       Relevant Orders   Ambulatory referral to Gastroenterology   Follow up in 6 mths for annual wellness exam, sooner as needed    I am having Ranee Gosselin maintain his nitroGLYCERIN, hydrocortisone, Zinc, vitamin C, vitamin E, ibuprofen, cetirizine, fluticasone, albuterol, atorvastatin, hydrocortisone cream, lisinopril, tamsulosin, and pantoprazole.  Meds ordered this encounter  Medications   pantoprazole (PROTONIX) 40 MG tablet    Sig: TAKE 1 TABLET(40 MG) BY MOUTH DAILY    Dispense:  30 tablet    Refill:  3     Teena Dunk, NP

## 2021-09-23 ENCOUNTER — Other Ambulatory Visit: Payer: Self-pay | Admitting: Nurse Practitioner

## 2021-09-23 DIAGNOSIS — H5213 Myopia, bilateral: Secondary | ICD-10-CM | POA: Diagnosis not present

## 2021-09-23 DIAGNOSIS — R1010 Upper abdominal pain, unspecified: Secondary | ICD-10-CM

## 2021-09-25 ENCOUNTER — Other Ambulatory Visit: Payer: Self-pay

## 2021-09-25 DIAGNOSIS — Z889 Allergy status to unspecified drugs, medicaments and biological substances status: Secondary | ICD-10-CM

## 2021-09-25 MED ORDER — FLUTICASONE PROPIONATE 50 MCG/ACT NA SUSP
2.0000 | Freq: Every day | NASAL | 11 refills | Status: DC
Start: 2021-09-25 — End: 2023-09-16

## 2021-09-28 ENCOUNTER — Ambulatory Visit: Payer: Medicaid Other | Admitting: Nurse Practitioner

## 2021-10-11 ENCOUNTER — Encounter: Payer: Self-pay | Admitting: Nurse Practitioner

## 2021-10-11 ENCOUNTER — Ambulatory Visit: Payer: Medicaid Other | Admitting: Nurse Practitioner

## 2021-10-11 ENCOUNTER — Other Ambulatory Visit: Payer: Self-pay

## 2021-10-11 VITALS — BP 120/78 | HR 79 | Temp 98.4°F | Ht 62.5 in | Wt 182.8 lb

## 2021-10-11 DIAGNOSIS — R3915 Urgency of urination: Secondary | ICD-10-CM

## 2021-10-11 DIAGNOSIS — N50812 Left testicular pain: Secondary | ICD-10-CM

## 2021-10-11 DIAGNOSIS — E782 Mixed hyperlipidemia: Secondary | ICD-10-CM

## 2021-10-11 DIAGNOSIS — I10 Essential (primary) hypertension: Secondary | ICD-10-CM

## 2021-10-11 MED ORDER — TAMSULOSIN HCL 0.4 MG PO CAPS
0.8000 mg | ORAL_CAPSULE | Freq: Every day | ORAL | 5 refills | Status: DC
  Filled 2021-10-11: qty 60, 30d supply, fill #0

## 2021-10-11 NOTE — Patient Instructions (Addendum)
?????? ??? ???? ??? ???????? Hypertension, Adult ???? ??? ?????? ????? ????? ????? ?????? ?????? (?????? ??? ????)? ??? ????? ???? ?? ??????? ???? ???? ??? ?????. ?????? ????????? ??????? ??????? ???? ???? ???? ?? ????? ??? ???? ????? ?????. ??? ??? ????? ???? ????? ??? ????? ???? ???? ??? ????? ??? ???? ???? ???????? ?? ??????. ??? ???? ??? ??? ????? ??? ????? ?? ??????? ???? ??? ???? ????? ?? ??? ?? ????? ?? ???? ?????? ?? ??? ?? ????? ??????? ???? ????. ???? ????? ??? ???? ????? ?? ??? ???? ??? ??? ????. ???? ?? ???? ????? ??? ???? ???????? ??? ?? 120?/80. ??? ????? ????? ("??????") ????? ????? ?????????. ????? ????? ?? ??????? ?? ???? ????. ??? ????? ?????? ("??????") ????? ????? ?????????. ????? ????? ?? ??????? ??? ?????? ?????. ?? ????? ??? ??????? ????? ?????? ???? ?????? ??? ?????. ????? ??? ??????? ???? ???? ??? ?????? ??? ???? ?? ????? ??. ?? ????? ????? ?????? ??? ????? ????? ???????? ??????? ??? ???? ???? ??????? ?????. ??? ???? ????? ??????? ??????? ???? ???? ??????? ???? ??????:  ???????.  ?? ????? ?? ??? ?????? ?? ????? ?????? ?? ?????? ??????????? ?? ??????.  ??? ?????? ?? ???? ?? ???????? ?? ?????? ??????.  ????? ?????.  ????? ???? ????? ????? ??? ?????? ?? ?????? ?? ????? ?? ??????? ???????? ?? ????? (????????).  ??????? ?? ?????? ????????? ????????. ?? ????? ?? ???????? ????? ??? ????? ????? ??????? ??? ????. ??? ??? ???????:  ??? ????? ?? ??? ????? ??????.  ?? ???? ???? ????? ???? ????? ??????? ???? ???? ???????.  ????. ????? ???????? ?? ???? ????.  ??????. ?? ????? ??????? ???? ??? ??? ????? ??? ?????? ???????? ????????.  ?????. ????? ???????? ??? ?????? ?? ?????? ??? ?? 45 ?????. ??? ?? 65? ???? ?????? ???? ???? ???? ??????? ?? ??????.  ??????? ??????? ????? ????????? ??????.  ???????. ?? ?????? ?????? ?? ???????? ?? ?? ???? ?????? ??? ???? ?? ?????. ??? ???????? ?????? (????? ??? ?????) ??? ????:  ??????.  ?????.  ??? ??????.  ???? ?????.  ??????? ??????.  ???????  ?? ????? ???????.  ??? ??? ?? ?????.  ????? ??????. ??? ????? ??? ??????? ????? ??? ?????? ????? ??? ??? ?? ????? ??????? ?? ??? ????? ??? ??? ????? ???? ????? ??? ????? ?? ??? ??? ??? ??? ???. ?? ????? ???? ???? ???? ??? ???? ?????? ??? ????? ?? ??????? ??????? ?? ????? ??? ????? ????. ???? ????? ????? ??? ????? ???????? ?????? ????. ???? ????? ?????? ???? ?? ???? ???????? ?? ????? ????? ??? ????? ?????? ???????. ???? ????? ????? ?? ???? ?????? ??? ???? ??? ?? ???? ?? ??????? ????? ????? ?? ?????:  ????? ????? ?? ??? ???? ???? ???? ???? ????? ???? ????? ???? ??????? ?????? ?????? ???? ??? ?? ??????? ??? ??? ????? ??? ??? ?????? ??????. ???? ??????? ?? ??? ?????? ?? ??????? ??? ????.  ????? ???? ??? ??? ???? ?? ?????? ???? ?? ??? ???? ???? ???? ?? ????? ???? ??????? ??????? ????? ??? ??? ????? ????????. ?? ????? ???? ??????? ?? ??? ?????? ??? ????? ??????? ??? ??? ????. ??? ??? ???? ??????? ?????? ??? ??? ?????? ?? ????? ????? ?? ??????? ???? ?? ???? ????? ????? ????? ??? ???? ???:  ?????? ?? ??? ??? ????? ??? ??? ??? ????.  ?????? ??? ??? ?? ?????? ???? ????? ?? ????. ??? ????? ????? ??? ???? ??? ?????? ??? ????? ??? ????? ???????? ?? ?? ?? ?????? ???? ??????? ???? ??????? ?????? ??????? ?? ??? ??? ??????? ?????? ??????? ??????. ??? ?????? ??? ??????? ???? ???? ??? ?????? ???????? ???? ?? ??? ?????? ??? ????? ??????? ?????? ?????? ?????? ??????? ???????? ?????? ????? ?????????. ?? ??? ?? ???? ??????? ?????? ??????? ?? ????? ?? ??? ??? ????? ??? ?????? ??? ???? ??????? ??? ??? ???? ????? ????:  ??? ??? ???? ????????? ???? ???? ??  130.  ??? ??? ???? ????????? ???? ???? ?? 80. ?? ????? ??? ???? ?????? ???????? ???? ???????? ??? ????? ?????? ????? ?????? ?? ???????. ???? ??? ????????? ?? ??????: ????? ??????   ???? ?????? ??????? ????? ??? ???? ????? ?? ??????? ??????????? ????? ????? ?? ???????? ?????? ?????? ???????. ???? ????? ??? ??? ????? ???? ????? ???? DASH (????? ??????? ?????? ??? ?????).  ?????? ?????? ???? ???????: ? ??? ????? ???? ????? ?? ??????? ????????? ???????. ???? ??? ??? ???? ?? ?? ???? ???????? ?????????. ? ????? ?????? ??????? ??? ??????? ????? ?????? ?? ????? ????? ?? ??? ?????? ???????. ???? ??? ???? ??????? ??????? ???????. ? ????? ?? ???? ?????? ????? ????? ?????? ??? ????? ???? ????? ?? ??????? ???? ?????. ? ???? ????? ??? ?????? ??????? ??????? ???????? ?? ??????? ???? ?????? ???????. ???? ??? ???? ??????? ????????? ????? ?????? ??? ????? ??????? ????? ????? ?????? ?????? ???????. ? ???? ??????? ?????? ?????? ????????. ?????? ??? ??????? ??? ??? ????? ?? ???????? ?????? ?????? ???????.  ??? ?? ???? ???????? ???? ???? ???? ??????. ????? ?? ?????? ???? ??????? ????? ?????? ?? ??? ????? ??? ?? 1500 ??? ?? ???????? ??????.  ?? ?????? ????????? ??????? ?? ??????? ???????: ? ??? ????? ???? ??????? ?????? ??????? ?????? ????. ? ??? ???? ??????? ?? ?????? ?????? ?? ??????? ????? ?? ?????? ??????.  ????? ????????? ????????: ? ??? ????? ?????? ????????? ??? ????? ??????: ? ????? ???? ??? ???? ??????. ? ??????? ??? ???? ??????. ? ????? ??? ???? ?????? ?? ??????. ?? ???????? ???????? ????? ??????? ?????? ???? ??? 12 ????? (355 ???) ?? ?????? ?? ??? ??? 5 ?????? (148 ???) ?? ?????? ?? ??? ??? ????? ???? (44 ???) ?? ????????? ???????? ??????. ??? ??????   ????? ?? ???? ??????? ?????? ??????? ?? ?????? ??? ??? ??? ?? ?????? ?????. ???? ?? ????? ??????? ??????? ??.  ???? ???????? ???? 30 ????? ??? ????? ?? ???? ???? ???????. ?? ???? ??????? ???????? ????? ?? ??????? ?? ???? ???????.  ???? ?????? ????? ??????? (?????? ????????)? ??? ???????? ?? ??? ???????? ????? ?? ???? ??????? ????????. ???? ?????? ???? ??????? ?? ???????? ???? 30 ????? ??? ????? 3 ???? ?? ???????.  ?? ?????? ?????? ????? ??? ????????? ?? ????? ??? ??????? ???????? ??????????? ???? ?????. ????? ???? ??????? ?????? ??? ????? ??? ???????? ??????? ?? ???????.  ???? ??? ??? ?? ?????? ????? ???????? ???? ???????  ?????? ??????? ??.  ????? ????? ?????? ???????? ??? ??????? ???? ??????? ??????. ???? ??? ???. ???????  ????? ??????? ???? ????? ????? ???? ?? ??? ???? ???? ????? ??? ?????? ?? ???? ??????? ?????? ????? ??. ???? ????????? ??????. ??? ????? ????? ??? ??? ???? ??? ??????? ??????.  ?? ???? ????? ????? ??? ????. ?????? ?? ??? ?? ????? ??????? ??? ???? ?? ?????? ??????.  ???? ???? ??????? ?????? ??????? ?? ?? ?????? ???????? ?? ??????? ??????? ???? ????? ???? ????????. ???? ???????? ??????? ?????? ?? ??????? ???????:  ??????? ??? ????? ?? ?? ??? ???? ??????? ???? ????????.  ??????? ????? ????? ????????.  ?????? ????????.  ??????? ????? ?? ??????.  ??????? ?????? ?? ???????. ???? ???????? ????? ?? ??????? ???????:  ??????? ????? ???? ?? ???? ????.  ?????? ???? ?? ????? ??? ?????.  ?????? ????????.  ?????? ???? ???? ?? ????? ?? ?????.  ????? ?????.  ???????? ?? ????? ?? ??????. ????  ???? ??? ????? ????? ???? ??? ???? ??????? ??? ??????? ???? ???? ??? ?????. ???? ?? ??? ?????? ?? ??? ??????? ??? ????? ?????? ??????? ???????? ?????.  ?? ????? ??? ???? ?????? ???????? ???? ???????? ??? ????? ?????? ????? ?????? ?? ???????. ??????? ????? ???????? ???? ??? ???? ??????? ??? ??  120?/80.  ???? ???? ??? ????? ?????? ??? ?????? ?? ???????? ?? ??????. ????? ????????? ???? ????? ??????? ??? ??? ?????? ????? ????? ?????? ???? ????? ??? ????? ???????? ?????? ???????? ???????? ?????? ????? ?????????. ??? ????? ?? ??? ????????? ?? ???? ?????? ????????? ???? ?????? ???? ??????? ??????. ???? ?? ?????? ??? ????? ???? ?? ???? ?? ???? ??????? ??????.? Document Revised: 07/21/2018 Document Reviewed: 07/21/2018 Elsevier Patient Education  2022 [employer]  ???? ?????????? ?????? Benign Prostatic Hyperplasia  ???? ?????????? ?????? (BPH) ????? ?? ??? ?????????? ???? ???? ?????? ??????? ?? ?????. ?????? ?? ?? ?????????? ?? ???? ?? ???? ??? ???????. ???????? ??? ??? ??? ??????. ??? ?????????? ???? ?? ??? ??? ?????  ???? ?? ????? ????? ?????? ??????. ????? ???? ???????? ???? ???????. ????? ??????? ??????. ??? ??? ????? ???? ????? ??? ???? ?????. ????? ?? ???? ?????????? ???????? ??? ???? ?????. ?????? ??? ?????? ?????? ???? ?????. ??? ?? ????? ????? ?? ??????? ???? ?? ???? ??? ??????? ?????. ??? ????? ????? ?????? ??????? ??? ??? ??????? ???? ?????? (????? ??????). ?? ????? ??? ??????? ??? ?????? ?????? ???? ????? ?? ?????? ?? ?????. ??? ???? ???? ???????? ?? ????? ???? ?????? ????? ??? ??????. ???? ????? ?????????? ?????? ?? ???? ?????? ??? ???? ?????? ????? ?????. ??? ??? ????? ?????????? ???? ???? ????? ????? ????? ?????? ????? ?? ????? ?????. ?? ????? ????? ?????? ????? ???????? ??????? ???? ?????? ??? ?? ???????. ?? ?????? ??? ?????? ?? ???????? ???? ????? ??? ?????? ?? ???:  ????????? ?????? ?? ????? ????? ??????.  ?????? ?????? ?????? ?? ????? ??????.  ????? ?? ??? ???? ?????.  ??? ????? ????? ???? ?????.  ???? ???? (????) ??? ??????.  ??? ?????? ??? ??????. ???? ????? ????? ????.  ??? ?????? ??? ????? ??????? ??????? ??????.  ??? ?? ????? ??????. ???? ??? ???? ?????? ?? ???? ???? ???? ?????.  ???? ??????? ??????? (UTI). ??? ????? ??? ??????? ????? ??? ?????? ???????? ??? ?????? ????? ?????? ?????? ??? ?????? ?? ?????. ???? ????? ????????? ???:  ??? ??????? ??? ???????. ???? ??? ????? ???? ????? ???? ?? ???? ?? ??????? ??? ???????? ?? ??????.  ????? ?????? ????????. ?? ????? ??? ????????? ???? ???? ??????? ?????? ?? ?????????? ???? ????? ?????? ???????? ?????? ?? ???????? ???????? ????? ?????? ?? ??? ??????????. ???? ??? ????? ?? ??? ????? ??? ???? ?? ??? ??? ?????? ?? ?????.  ??? ????? (????? ???).  ??? ??????? ?????? ?????????? (PSA). ???? ?????? ?? ??????? ???? ????? ??????????.  ??????? ???????? ??? ???????. ?????? ??? ???????? ??????? ??????? ?????? ???? ????????? ???? ??????????. ?? ????? ???? ??????? ?????? ??? ?????? ?? ????? ??? ????????? (?????? ???????? ????????). ??? ?????? ???  ??????? ????? ??? ???? ???????? ?????? ???? ??????? ?????? ????? ?????? (?????? ????) ?? ?????? ?????. ???? ?????? ???? ??? ?????? ??? ??? ?????. ????? ???? ??????:  ???????? ??????? ???????. ?? ???? ??? ?????? ?????? ??????? ??? ???? ?????? ???????? ??????.  ????? ??????? ?? ??? ??????? ??? ?????: ? ????? ???? ??? ??????????. ? ????? ?????? ????? ??????????.  ??????? ?? ??????? ??????. ??? ????? ??????? ???: ? ??????? ??????????. ?? ??? ???????? ???? ????? ???? ?????????? ????? ??? ?? ????? ?? ?????? ?? ????? ???????. ? ??????? ?????????? ??? ??????? (TURP). ?? ??? ???????? ???? ????? ???? ???? ???? ?? ????? ?????? (???? ?????). ??????? ??? ???? ???? ?? ?????? ???????? ??????????. ????? ????? ????? ??? ???? ?????? ?????. ???? ??? ??? ????? ????????. ? ???? ??? ??????? (TUIP). ?? ??? ???????? ???? ????? ???? ????? ?? ??????????. ????? ??? ?? ??? ?????????? ??? ???? ?????. ? ?????? ??????????? ??????? ??? ??????? (  TUMT). ?????? ??? ??????? ????? ?????????? ?????? ???????. ????? ??????? ????? ???? ????? ?? ????? ??????????. ? ????????? ?????? ??? ??????? (TUNA). ?????? ??? ??????? ????? ????? ?????? ?????? ??? ???? ?? ???? ??????????. ? ????? ???? ??????? ??? ??????? (ILC). ?????? ??? ??????? ?????? ?????? ?????? ??? ???? ?? ???? ??????????. ? ??????? ????????? ??? ???? ????? (TUVP). ?????? ??? ??????? ??????? ???????? ?????? ?????? ??? ???? ?? ???? ??????????. ? ??? ???? ????? ??? ??????????. ?? ??? ??????? ???? ????? ??? ????? ???? ???? ?????????? ?????? ?? ???? ?????. ???? ??? ????????? ?? ??????:  ????? ??????? ???? ????? ????? ???? ?? ??? ???? ???? ??? ?? ?????? ?? ???? ??????? ?????? ????? ??.  ???? ?????? ???? ???? ?????????. ???? ????? ??????? ?????? ??? ???? ?????? ??.  ???? ??? ????? ????? ?? ??????? ??? ?????? ??? ????? ?? ??? ????? ????.  ???? ?? ??? ???? ???????? ?? ?????? ???? ????????.  ?? ????? ??? ??????.  ????? ????? ?????? ????????. ???? ??? ???. ???? ????? ??????? ?????? ??  ??????? ???????:  ?????? ???? ??? ???? ?? ?????.  ??? ???? ??????? ?? ??????.  ???? ???? ?????? ?? ??????? ???? ????????.  ??? ????? ???? ?????? ?????? ??? ????? ?????.  ?????? ???? ????? ????????? ?? ????? ?? ??????. ???? ???????? ????? ?? ??????? ???????:  ??????? ???? ?? ???????.  ??? ?????? ??? ?????? ?????.  ?????? ????? ?? ???? ?? ?????? ????????.  ???? ????? ????? ?? ???? ?????? ?? ??????? ?? ?????.  ???? ?? ????? ????? ????? ?????? ??????.  ?????? ???? ?? ??? ????? ??? ???? ?? ????? ?? ??? ???????. ???????? ?????? ?? ???? ??? ?????? ??????. ??? ???? ??? ??????? ????? ??? ???? ???? ?????. ???? ???????? ??? ?????. ???? ???? 911.  ?? ????? ?? ??? ???? ??????? ????? ?? ??.    ?? ??? ??????? ????? ??? ????????. ????  ???? ?????????? ?????? (BPH) ???? ?? ?????????? ???? ???? ?????? ??????? ?? ?????. ??? ???? ???? ???????.  ????? ?? ???? ?????????? ???????? ??? ???? ?????. ????? ?? ???? ??? ??? ????? ??????.  ????? ???????? ??????? ???? ?????? ??? ?? ???????.  ???? ???????? ??? ????? ??? ?? ????? ?? ?????? ?????. ??? ????? ?? ??? ????????? ?? ???? ?????? ????????? ???? ?????? ???? ??????? ??????. ???? ?? ?????? ??? ????? ???? ?? ???? ?? ???? ??????? ??????.? Document Revised: 05/16/2021 Document Reviewed: 05/16/2021 Elsevier Patient Education  2022 [employer].

## 2021-10-11 NOTE — Progress Notes (Signed)
[address], [place]  [address] Phone:  [phone]   Fax:  [phone]   Established Patient Office Visit  Subjective:  Patient ID: Manuel Brooks, male    DOB: [birth year]  Age: 60 y.o. MRN: [id number]  CC:  Chief Complaint  Patient presents with   Follow-up    Pt is here today for his follow up visit. No concerns or issues.     HPI Manuel Brooks presents for follow up. He  has a past medical history of Allergy, CAP (community acquired pneumonia) (01/2019), Elevated liver enzymes (10/2019), Exertional shortness of breath (04/2019), GERD (gastroesophageal reflux disease), H. pylori infection (03/2020), Hyperlipidemia (10/2019), Hypertension, Leucopenia, and Vitamin D deficiency (11/2019).   He is in today for follow up on HTN. He is doing well. He is compliant with his medication. He works full time. He tries to eat a low sodium diet.  His risk factors: dyslipidemia, hypertension, male gender, and obesity (BMI >= 30 kg/m2). Use of agents associated with hypertension Denies headache, dizziness, visual changes, shortness of breath, dyspnea on exertion, chest pain, nausea, vomiting or any edema.   Patient presents for evaluation of lipids. Compliance with treatment thus far has been good. A repeat fasting lipid profile was done. The patient does not use medications that may worsen dyslipidemias (corticosteroids, progestins, anabolic steroids, diuretics, beta-blockers, amiodarone, cyclosporine, olanzapine). The patient exercises occasionally. Risk factor 2  He reports that post intercourse he has testicular pain with decreased semen output. Denies  recent injuries, oral lesions,, sore throat, pelvic pain, penile discharge, dysuria, nocturia any visible ulcers or lesions   Past Medical History:  Diagnosis Date   Allergy    CAP (community acquired pneumonia) 01/2019   Elevated liver enzymes 10/2019   Exertional shortness of breath 04/2019   GERD  (gastroesophageal reflux disease)    H. pylori infection 03/2020   Hyperlipidemia 10/2019   Hypertension    Leucopenia    Vitamin D deficiency 11/2019    Past Surgical History:  Procedure Laterality Date   BIOPSY BONE MARROW  09/21/2011       OTHER SURGICAL HISTORY     was unable to have childern and had surgery so he could have children 1990    Family History  Family history unknown: Yes    Social History   Socioeconomic History   Marital status: Married    Spouse name: Not on file   Number of children: Not on file   Years of education: Not on file   Highest education level: Not on file  Occupational History   Not on file  Tobacco Use   Smoking status: Former    Packs/day: 0.50    Types: Cigarettes    Start date: 10/22/1988    Quit date: 10/22/1998    Years since quitting: 22.9   Smokeless tobacco: Never  Vaping Use   Vaping Use: Never used  Substance and Sexual Activity   Alcohol use: No   Drug use: No    Frequency: 1.0 times per week   Sexual activity: Yes  Other Topics Concern   Not on file  Social History Narrative   Not on file   Social Determinants of Health   Financial Resource Strain: Not on file  Food Insecurity: Not on file  Transportation Needs: Not on file  Physical Activity: Not on file  Stress: Not on file  Social Connections: Not on file  Intimate Partner Violence: Not on file  Outpatient Medications Prior to Visit  Medication Sig Dispense Refill   albuterol (VENTOLIN HFA) 108 (90 Base) MCG/ACT inhaler Inhale 2 puffs into the lungs every 6 (six) hours as needed for wheezing or shortness of breath. 18 g 6   atorvastatin (LIPITOR) 10 MG tablet Take 1 tablet (10 mg total) by mouth daily. 30 tablet 11   cetirizine (ZYRTEC) 10 MG tablet Take 1 tablet (10 mg total) by mouth daily. 90 tablet 3   fluticasone (FLONASE) 50 MCG/ACT nasal spray Place 2 sprays into both nostrils daily. 9.9 g 11   hydrocortisone (ANUSOL-HC) 2.5 % rectal cream Place 1  application rectally 2 (two) times daily. 30 g 3   hydrocortisone cream (PREPARATION H) 1 % Apply 1 application topically 2 (two) times daily. 30 g 11   ibuprofen (ADVIL) 800 MG tablet TAKE 1 TABLET (800 MG TOTAL) BY MOUTH EVERY 8 (EIGHT) HOURS AS NEEDED. 120 tablet 6   lisinopril (ZESTRIL) 5 MG tablet Take 1 tablet (5 mg total) by mouth daily. 30 tablet 11   nitroGLYCERIN (NITRODUR - DOSED IN MG/24 HR) 0.2 mg/hr patch Apply 1/4 patch to the affected area every 24 hours 30 patch 11   pantoprazole (PROTONIX) 40 MG tablet TAKE 1 TABLET(40 MG) BY MOUTH DAILY 30 tablet 3   prednisoLONE acetate (PRED FORTE) 1 % ophthalmic suspension SMARTSIG:In Eye(s)     RESTASIS 0.05 % ophthalmic emulsion      vitamin C (ASCORBIC ACID) 250 MG tablet Take 1 tablet (250 mg total) by mouth daily. 30 tablet 6   vitamin E 180 MG (400 UNITS) capsule Take 1 capsule (400 Units total) by mouth daily. 30 capsule 6   Zinc 100 MG TABS Take 100 mg by mouth daily.     tamsulosin (FLOMAX) 0.4 MG CAPS capsule Take 1 capsule (0.4 mg total) by mouth daily. 30 capsule 11   No facility-administered medications prior to visit.    No Known Allergies  ROS Review of Systems    Objective:    Physical Exam Exam conducted with a chaperone present.  Constitutional:      General: He is not in acute distress. HENT:     Head: Normocephalic and atraumatic.  Cardiovascular:     Rate and Rhythm: Normal rate and regular rhythm.     Pulses: Normal pulses.     Heart sounds: Normal heart sounds.  Pulmonary:     Effort: Pulmonary effort is normal.     Breath sounds: Normal breath sounds.  Abdominal:     Palpations: Abdomen is soft.     Comments: hypoactive  Genitourinary:    Testes: Normal.  Musculoskeletal:        General: Normal range of motion.     Cervical back: Normal range of motion.  Skin:    General: Skin is warm.     Capillary Refill: Capillary refill takes less than 2 seconds.  Neurological:     General: No focal  deficit present.     Mental Status: He is alert and oriented to person, place, and time.  Psychiatric:        Mood and Affect: Mood normal.        Behavior: Behavior normal.        Thought Content: Thought content normal.        Judgment: Judgment normal.    BP 120/78    Pulse 79    Temp 98.4 F (36.9 C)    Ht 5' 2.5" (1.588 m)  Wt 182 lb 12.8 oz (82.9 kg)    SpO2 98%    BMI 32.90 kg/m  Wt Readings from Last 3 Encounters:  10/11/21 182 lb 12.8 oz (82.9 kg)  08/25/21 181 lb (82.1 kg)  07/26/21 186 lb 0.8 oz (84.4 kg)     There are no preventive care reminders to display for this patient.   There are no preventive care reminders to display for this patient.  Lab Results  Component Value Date   TSH 0.715 11/20/2019   Lab Results  Component Value Date   WBC 3.2 (L) 07/26/2021   HGB 15.6 07/26/2021   HCT 46.1 07/26/2021   MCV 81 07/26/2021   PLT 263 07/26/2021   Lab Results  Component Value Date   NA 141 08/09/2021   K 4.4 08/09/2021   CO2 25 07/26/2021   GLUCOSE 94 08/09/2021   BUN 17 08/09/2021   CREATININE 1.08 08/09/2021   BILITOT 0.3 08/09/2021   ALKPHOS 77 08/09/2021   AST 23 08/09/2021   ALT 18 07/26/2021   PROT 7.2 08/09/2021   ALBUMIN 4.4 08/09/2021   CALCIUM 9.4 08/09/2021   ANIONGAP 12 10/16/2019   EGFR 78 08/09/2021   Lab Results  Component Value Date   CHOL 151 04/25/2021   Lab Results  Component Value Date   HDL 50 04/25/2021   Lab Results  Component Value Date   LDLCALC 86 04/25/2021   Lab Results  Component Value Date   TRIG 76 04/25/2021   Lab Results  Component Value Date   CHOLHDL 3.0 04/25/2021   Lab Results  Component Value Date   HGBA1C 5.6 11/20/2019      Assessment & Plan:   Problem List Items Addressed This Visit       Cardiovascular and Mediastinum   Hypertension - Primary Stable Encouraged on going compliance with current medication regimen Encouraged home monitoring and recording BP <130/80 Eating a  heart-healthy diet with less salt Encouraged regular physical activity  Recommend Weight loss       Other   Mixed hyperlipidemia Stable  Continue with current regimen.  No changes warranted. Good patient compliance.    Other Visit Diagnoses     Urinary urgency    Increased Tamsulsin 0.8 mg daily   Relevant Medications   tamsulosin (FLOMAX) 0.4 MG CAPS capsule   Left testicular pain   Persistent will continue to monitor  Urology referral if pain persist        Meds ordered this encounter  Medications   tamsulosin (FLOMAX) 0.4 MG CAPS capsule    Sig: Take 2 capsules (0.8 mg total) by mouth daily.    Dispense:  60 capsule    Refill:  5    Order Specific Question:   Supervising Provider    Answer:   Tresa Garter [id number]    Follow-up: Return in about 3 months (around 01/09/2022) for Follow up HTN [id number].    Vevelyn Francois, NP

## 2021-11-01 IMAGING — CT CT ABD-PELV W/ CM
3 of 8 series · 13 of 46 positions shown, 14 images · IV contrast (APPLIED)
Comparison: Ultrasound December 21, 2020

CLINICAL DATA: One month of upper abdominal pain.

EXAM:
CT ABDOMEN AND PELVIS WITH CONTRAST
TECHNIQUE: Multidetector CT imaging of the abdomen and pelvis was performed
using the standard protocol following bolus administration of
intravenous contrast.
CONTRAST:  80mL OMNIPAQUE IOHEXOL 350 MG/ML SOLN

[Series 2: axial st · axial · 0.79mm/px · z∈[-603,-278]mm · 8 of 85 slices shown, 9 images (1 of 2)]
[im 10/85  soft-tissue]
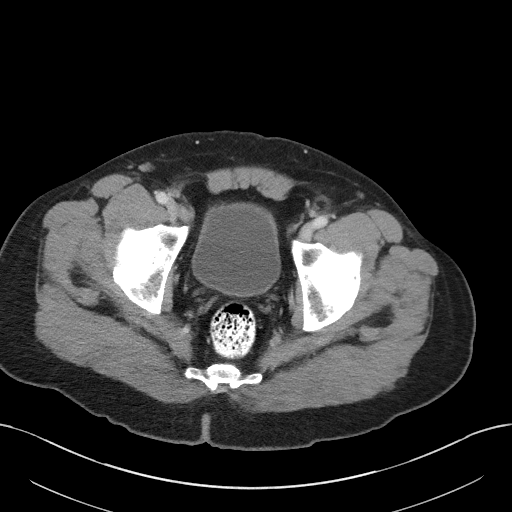
[im 10/85  bone]
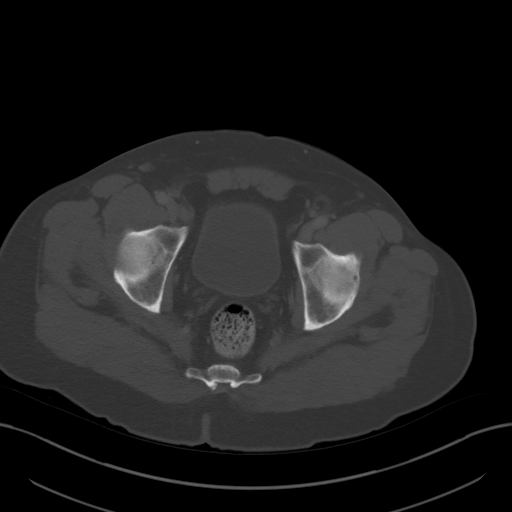
[im 19/85  soft-tissue]
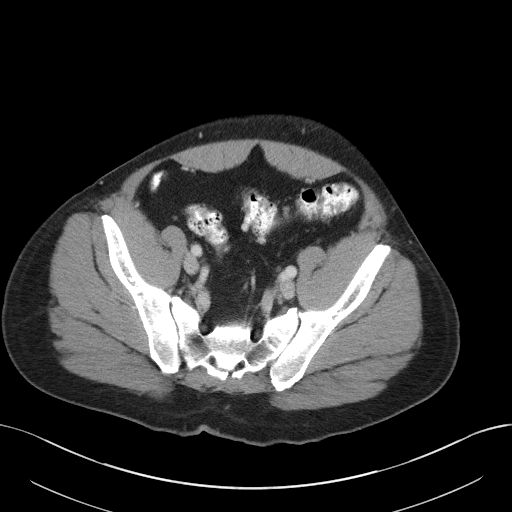
[im 29/85  soft-tissue]
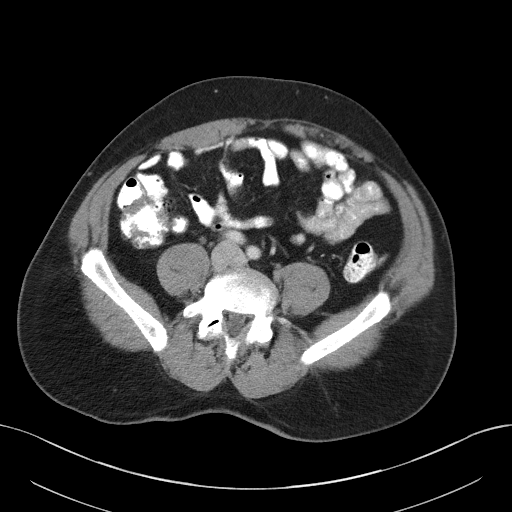
[im 38/85  soft-tissue]
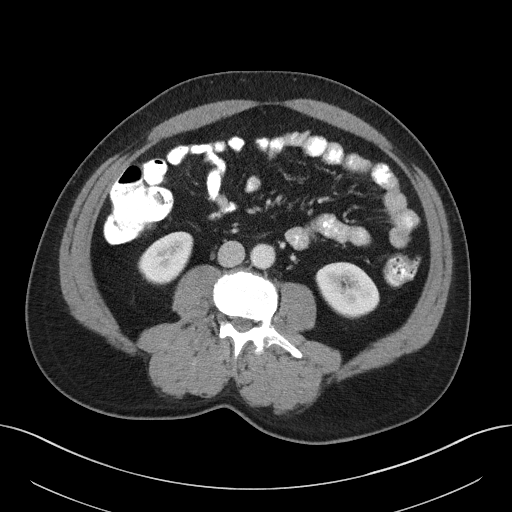
[im 47/85  soft-tissue]
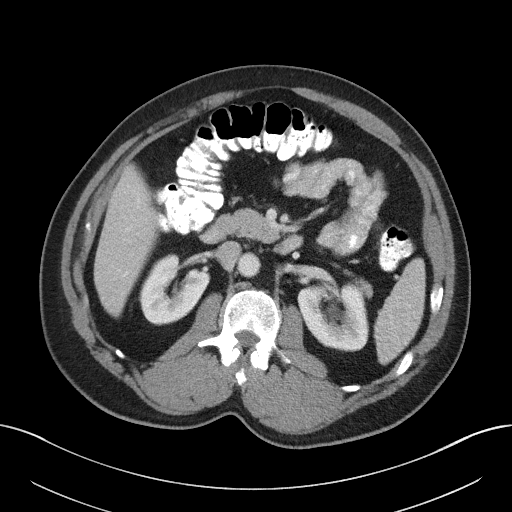
[im 57/85  soft-tissue]
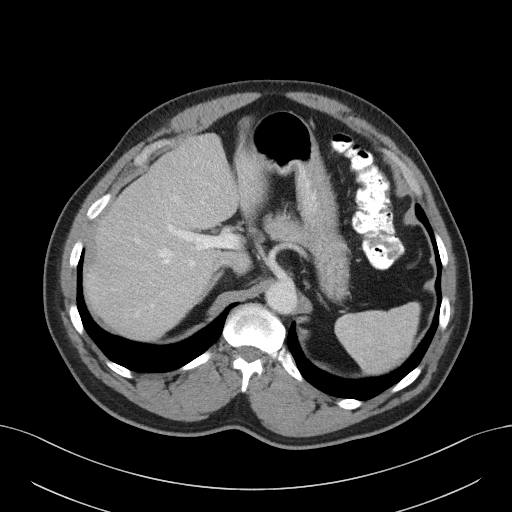
[im 66/85  soft-tissue]
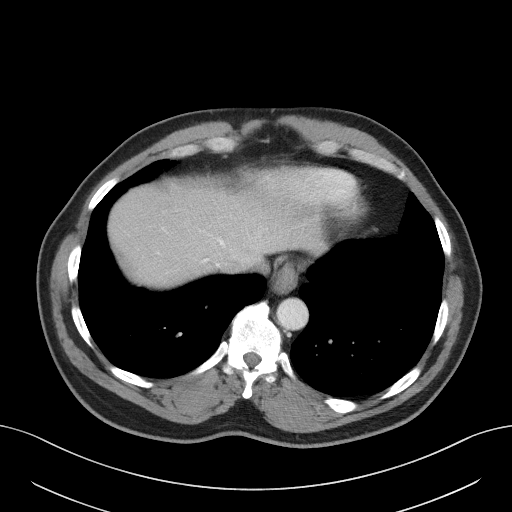
[im 75/85  soft-tissue]
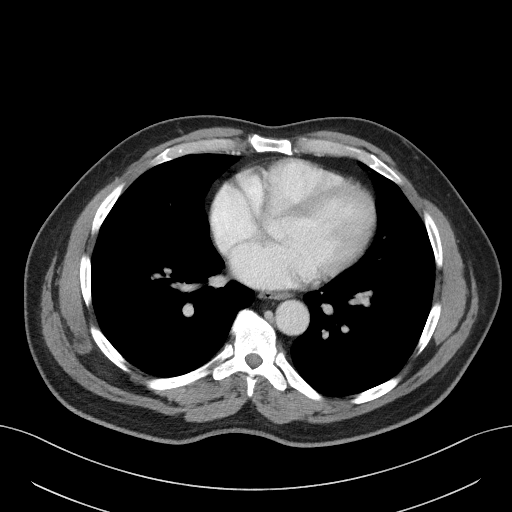

[Series 4: axial st · axial · 0.79mm/px · z∈[-666,-621]mm · 2 of 28 slices shown (2 of 2)]
[im 10/28  soft-tissue]
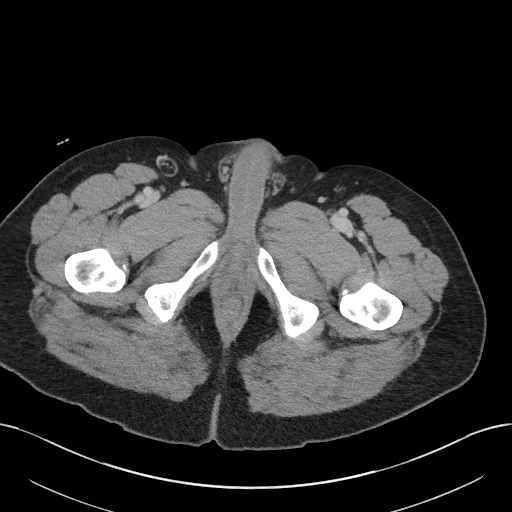
[im 19/28  soft-tissue]
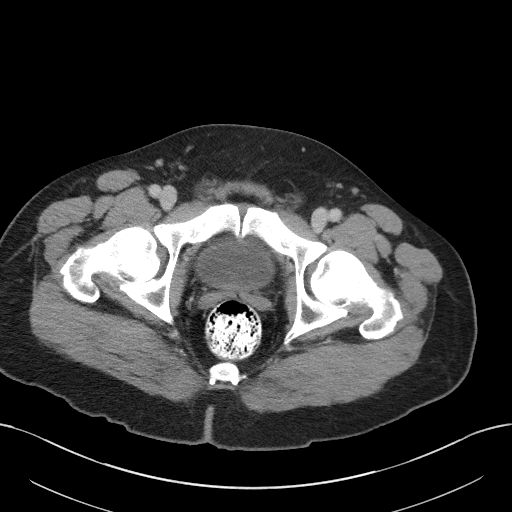

[Series 9: coronal st · coronal · 0.30mm/px · 3 of 87 slices shown]
[im 22/87  soft-tissue]
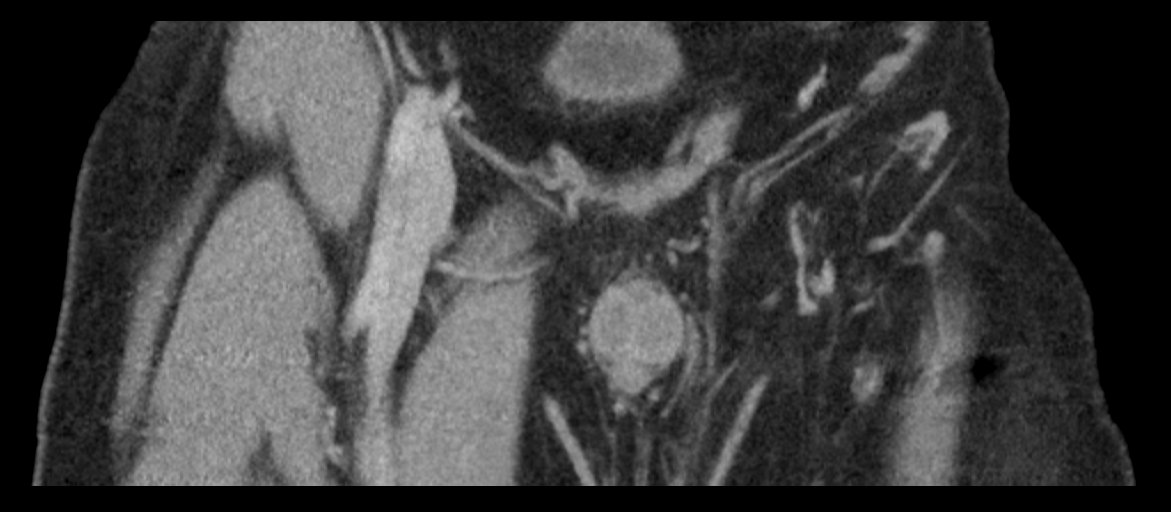
[im 44/87  soft-tissue]
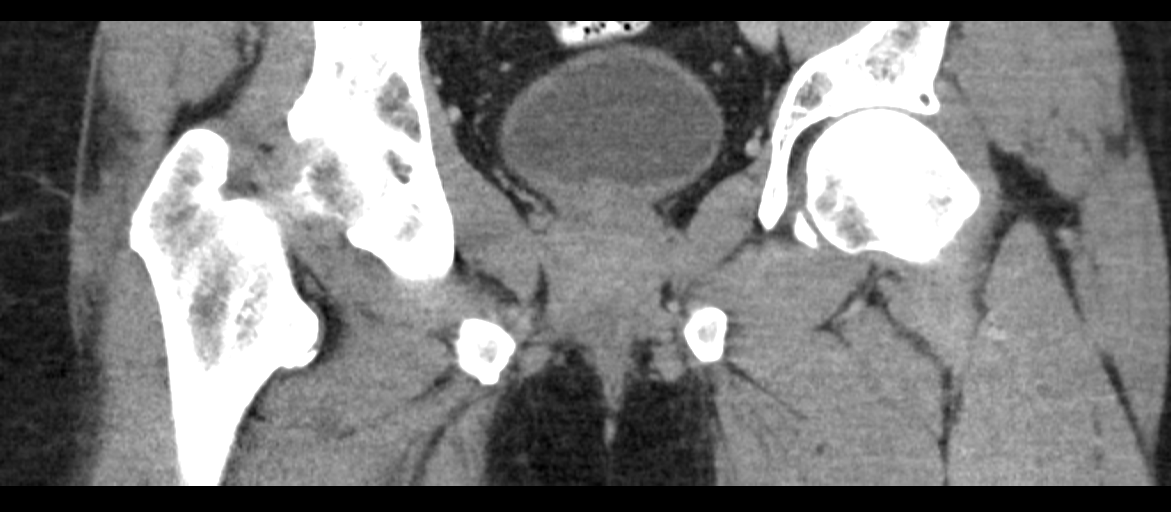
[im 65/87  soft-tissue]
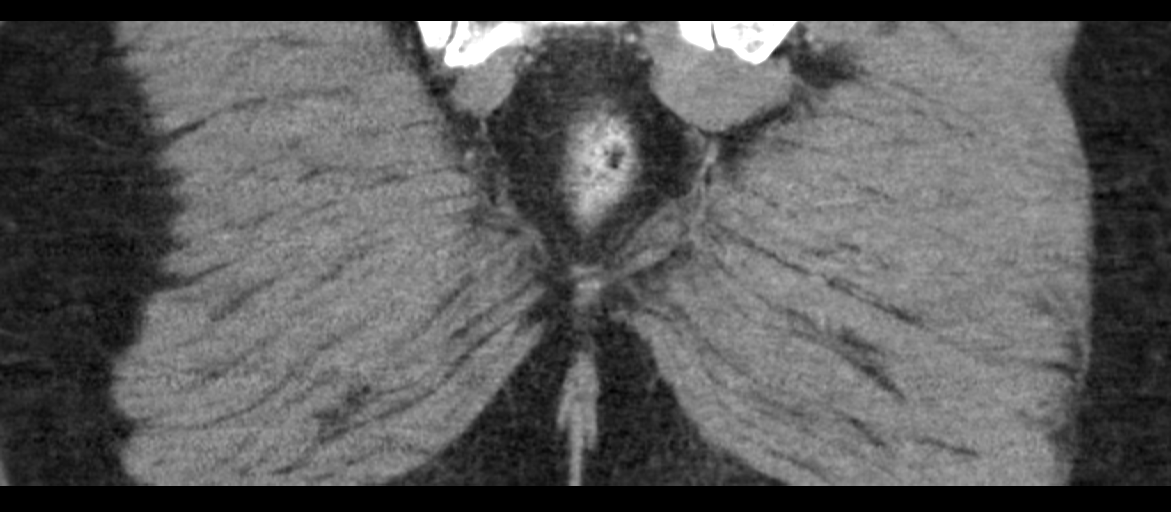

[13 of 46 positions shown; findings below may reference images not displayed]

FINDINGS: Lower chest: Peripheral predominant solid left-sided pulmonary
nodules measuring up to 4 mm on image [DATE].

Hepatobiliary: No focal liver abnormality is seen. No gallstones,
gallbladder wall thickening, or biliary dilatation.

Pancreas: No pancreatic ductal dilatation or surrounding
inflammatory changes.

Spleen: Normal in size without focal abnormality.

Adrenals/Urinary Tract: Bilateral adrenal glands are unremarkable.
No hydronephrosis. No solid enhancing renal mass. Left renal sinus
cysts. Urinary bladder is unremarkable for degree of distension.

Stomach/Bowel: Radiopaque enteric contrast traverses the rectum.
Small hiatal hernia otherwise stomach is unremarkable for degree of
distension. No pathologic dilation of small or large bowel. The
appendix and terminal ileum are unremarkable. Sigmoid colonic
diverticulosis without findings of acute diverticulitis.

Vascular/Lymphatic: No abdominal aortic aneurysm. No pathologically
enlarged abdominal or pelvic lymph nodes.

Reproductive: Prostate is unremarkable.

Other: No abdominopelvic ascites.

Musculoskeletal: Age indeterminate bilateral L5 pars defects with
grade 1 L5 on S1 anterolisthesis.
IMPRESSION: 1. No acute intra abdominopelvic findings.
2. Sigmoid colonic diverticulosis without findings of acute
diverticulitis.
3. Age indeterminate bilateral L5 pars defects with grade 1 L5 on S1
anterolisthesis, correlation with point tenderness to assess acuity
is suggested.
4. Small hiatal hernia.
5. Peripheral predominant solid left-sided pulmonary nodules
measuring up to 4 mm. No routine follow-up imaging is recommended
per [HOSPITAL] Guidelines. These guidelines do not apply to
immunocompromised patients and patients with cancer. Follow up in
patients with significant comorbidities as clinically warranted. For
lung cancer screening, adhere to Lung-RADS guidelines. Reference:

## 2021-11-13 ENCOUNTER — Other Ambulatory Visit (HOSPITAL_COMMUNITY): Payer: Self-pay

## 2021-11-13 ENCOUNTER — Other Ambulatory Visit: Payer: Self-pay

## 2021-11-13 ENCOUNTER — Other Ambulatory Visit: Payer: Self-pay | Admitting: Nurse Practitioner

## 2021-11-13 ENCOUNTER — Telehealth: Payer: Self-pay

## 2021-11-13 MED ORDER — NIRMATRELVIR/RITONAVIR (PAXLOVID)TABLET
3.0000 | ORAL_TABLET | Freq: Two times a day (BID) | ORAL | 0 refills | Status: AC
  Filled 2021-11-13: qty 30, 5d supply, fill #0

## 2021-11-13 NOTE — Progress Notes (Signed)
   [facility] [address] [place], [place]  [address] Phone:  [phone]   Fax:  [phone] 

## 2021-11-13 NOTE — Telephone Encounter (Signed)
Pt said that he has COVID and needs something called into the pharmacy

## 2021-11-14 ENCOUNTER — Other Ambulatory Visit: Payer: Self-pay

## 2022-01-29 ENCOUNTER — Encounter: Payer: Self-pay | Admitting: Nurse Practitioner

## 2022-02-01 ENCOUNTER — Encounter: Payer: Self-pay | Admitting: Nurse Practitioner

## 2022-02-01 ENCOUNTER — Ambulatory Visit (INDEPENDENT_AMBULATORY_CARE_PROVIDER_SITE_OTHER): Payer: Medicaid Other | Admitting: Nurse Practitioner

## 2022-02-01 VITALS — BP 128/85 | HR 65 | Temp 98.3°F | Ht 62.5 in | Wt 174.2 lb

## 2022-02-01 DIAGNOSIS — S99929A Unspecified injury of unspecified foot, initial encounter: Secondary | ICD-10-CM

## 2022-02-01 DIAGNOSIS — H9202 Otalgia, left ear: Secondary | ICD-10-CM

## 2022-02-01 DIAGNOSIS — S99922A Unspecified injury of left foot, initial encounter: Secondary | ICD-10-CM

## 2022-02-01 DIAGNOSIS — H6690 Otitis media, unspecified, unspecified ear: Secondary | ICD-10-CM | POA: Diagnosis not present

## 2022-02-01 MED ORDER — CIPRO HC 0.2-1 % OT SUSP: 3.0000 [drp] | Freq: Two times a day (BID) | OTIC | 0 refills | Status: DC

## 2022-02-01 MED ORDER — CEPHALEXIN 500 MG PO CAPS: 500.0000 mg | ORAL_CAPSULE | Freq: Three times a day (TID) | ORAL | 0 refills | Status: AC

## 2022-02-01 NOTE — Progress Notes (Signed)
@Patient  ID: , male    DOB: [birth year], 60 y.o.   MRN: [id number] ? ?Chief Complaint  ?Patient presents with  ? OFFICE VISIT  ?  Pt stated both ears has been hurting for the past 3 weeks also left big toe pt injured it a week ago  ? ? ?Referring provider: ?[id number], NP ? ?HPI ? ?Patient presents today with left great toe injury. Patient dropped a tire on his toe 1 week ago. This damaged his toe nail. He states that it is throbbing. There is no bruising to the toe. He states that there is dried blood around the toe nail.  ? ?Patient also states that his left ear has been hurting. This has been ongoing for 1 week. He states that he has had this issue in the past and ear drops worked well. ? ?Denies f/c/s, n/v/d, hemoptysis, PND, chest pain or edema. ? ? ? ? ? ?No Known Allergies ? ?Immunization History  ?Administered Date(s) Administered  ? Influenza,inj,Quad PF,6+ Mos 07/22/2014, 08/02/2016, 10/10/2018, 09/12/2020  ? Tdap 05/20/2018  ? ? ?Past Medical History:  ?Diagnosis Date  ? Allergy   ? CAP (community acquired pneumonia) 01/2019  ? Elevated liver enzymes 10/2019  ? Exertional shortness of breath 04/2019  ? GERD (gastroesophageal reflux disease)   ? H. pylori infection 03/2020  ? Hyperlipidemia 10/2019  ? Hypertension   ? Leucopenia   ? Vitamin D deficiency 11/2019  ? ? ?Tobacco History: ?Social History  ? ?Tobacco Use  ?Smoking Status Former  ? Packs/day: 0.50  ? Types: Cigarettes  ? Start date: 10/22/1988  ? Quit date: 10/22/1998  ? Years since quitting: 23.2  ?Smokeless Tobacco Never  ? ?Counseling given: Not Answered ? ? ?Outpatient Encounter Medications as of 02/01/2022  ?Medication Sig  ? albuterol (VENTOLIN HFA) 108 (90 Base) MCG/ACT inhaler Inhale 2 puffs into the lungs every 6 (six) hours as needed for wheezing or shortness of breath.  ? atorvastatin (LIPITOR) 10 MG tablet Take 1 tablet (10 mg total) by mouth daily.  ? cephALEXin (KEFLEX) 500 MG capsule Take 1 capsule (500 mg total) by mouth 3  (three) times daily for 10 days.  ? cetirizine (ZYRTEC) 10 MG tablet Take 1 tablet (10 mg total) by mouth daily.  ? ciprofloxacin-hydrocortisone (CIPRO HC) OTIC suspension Place 3 drops into the left ear 2 (two) times daily.  ? fluticasone (FLONASE) 50 MCG/ACT nasal spray Place 2 sprays into both nostrils daily.  ? hydrocortisone (ANUSOL-HC) 2.5 % rectal cream Place 1 application rectally 2 (two) times daily.  ? hydrocortisone cream (PREPARATION H) 1 % Apply 1 application topically 2 (two) times daily.  ? ibuprofen (ADVIL) 800 MG tablet TAKE 1 TABLET (800 MG TOTAL) BY MOUTH EVERY 8 (EIGHT) HOURS AS NEEDED.  ? lisinopril (ZESTRIL) 5 MG tablet Take 1 tablet (5 mg total) by mouth daily.  ? nitroGLYCERIN (NITRODUR - DOSED IN MG/24 HR) 0.2 mg/hr patch Apply 1/4 patch to the affected area every 24 hours  ? pantoprazole (PROTONIX) 40 MG tablet TAKE 1 TABLET(40 MG) BY MOUTH DAILY  ? prednisoLONE acetate (PRED FORTE) 1 % ophthalmic suspension SMARTSIG:In Eye(s)  ? RESTASIS 0.05 % ophthalmic emulsion   ? tamsulosin (FLOMAX) 0.4 MG CAPS capsule Take 2 capsules (0.8 mg total) by mouth daily.  ? vitamin C (ASCORBIC ACID) 250 MG tablet Take 1 tablet (250 mg total) by mouth daily.  ? vitamin E 180 MG (400 UNITS) capsule Take 1 capsule (400 Units total) by  mouth daily.  ? Zinc 100 MG TABS Take 100 mg by mouth daily.  ? ?No facility-administered encounter medications on file as of 02/01/2022.  ? ? ? ?Review of Systems ? ?Review of Systems  ?HENT:  Positive for ear pain.   ?Musculoskeletal:   ?     Left great toe injury with injury to nail bed   ? ? ? ?Physical Exam ? ?BP 128/85 (BP Location: Left Arm, Patient Position: Sitting, Cuff Size: Normal)   Pulse 65   Temp 98.3 ?F (36.8 ?C)   Ht 5' 2.5" (1.588 m)   Wt 174 lb 4 oz (79 kg)   SpO2 100%   BMI 31.36 kg/m?  ? ?Wt Readings from Last 5 Encounters:  ?02/01/22 174 lb 4 oz (79 kg)  ?10/11/21 182 lb 12.8 oz (82.9 kg)  ?08/25/21 181 lb (82.1 kg)  ?07/26/21 186 lb 0.8 oz (84.4 kg)   ?06/29/21 186 lb 0.6 oz (84.4 kg)  ? ? ? ?Physical Exam ?Vitals and nursing note reviewed.  ?Constitutional:   ?   General: He is not in acute distress. ?   Appearance: He is well-developed.  ?Cardiovascular:  ?   Rate and Rhythm: Normal rate and regular rhythm.  ?Pulmonary:  ?   Effort: Pulmonary effort is normal.  ?   Breath sounds: Normal breath sounds.  ?Musculoskeletal:  ?     Feet: ? ?Feet:  ?   Comments: Left great toe with injured nail bed. Dried blood noted.  ?Skin: ?   General: Skin is warm and dry.  ?Neurological:  ?   Mental Status: He is alert and oriented to person, place, and time.  ?Psychiatric:     ?   Mood and Affect: Mood normal.     ?   Behavior: Behavior normal.  ? ? ? ?Lab Results: ? ?CBC ?   ?Component Value Date/Time  ? WBC 3.2 (L) 07/26/2021 1033  ? WBC 1.2 (LL) 10/16/2019 0956  ? RBC 5.72 07/26/2021 1033  ? RBC 5.54 10/16/2019 0956  ? HGB 15.6 07/26/2021 1033  ? HGB 14.9 09/27/2011 1015  ? HCT 46.1 07/26/2021 1033  ? HCT 44.6 09/27/2011 1015  ? PLT 263 07/26/2021 1033  ? MCV 81 07/26/2021 1033  ? MCV 82.4 09/27/2011 1015  ? MCH 27.3 07/26/2021 1033  ? MCH 26.9 10/16/2019 0956  ? MCHC 33.8 07/26/2021 1033  ? MCHC 33.0 10/16/2019 0956  ? RDW 13.6 07/26/2021 1033  ? RDW 14.3 09/27/2011 1015  ? LYMPHSABS 1.6 07/26/2021 1033  ? LYMPHSABS 1.9 09/27/2011 1015  ? MONOABS 0.2 10/16/2019 0956  ? MONOABS 0.4 09/27/2011 1015  ? EOSABS 0.3 07/26/2021 1033  ? BASOSABS 0.0 07/26/2021 1033  ? BASOSABS 0.0 09/27/2011 1015  ? ? ?BMET ?   ?Component Value Date/Time  ? NA 141 08/09/2021 0856  ? K 4.4 08/09/2021 0856  ? CL 103 08/09/2021 0856  ? CO2 25 07/26/2021 1033  ? GLUCOSE 94 08/09/2021 0856  ? GLUCOSE 108 (H) 10/16/2019 0956  ? BUN 17 08/09/2021 0856  ? CREATININE 1.08 08/09/2021 0856  ? CREATININE 1.00 10/30/2016 0839  ? CALCIUM 9.4 08/09/2021 0856  ? GFRNONAA 91 11/20/2019 1501  ? GFRNONAA 83 10/30/2016 0839  ? GFRAA 105 11/20/2019 1501  ? GFRAA >89 10/30/2016 0839  ? ? ?BNP ?No results found for:  BNP ? ?ProBNP ?No results found for: PROBNP ? ?Imaging: ?No results found. ? ? ?Assessment & Plan:  ? ?Injury of left toe ?- cephALEXin (  KEFLEX) 500 MG capsule; Take 1 capsule (500 mg total) by mouth 3 (three) times daily for 10 days.  Dispense: 30 capsule; Refill: 0 ? ?2. Left ear pain ? ?- ciprofloxacin-hydrocortisone (CIPRO HC) OTIC suspension; Place 3 drops into the left ear 2 (two) times daily.  Dispense: 10 mL; Refill: 0 ? ?3. Injury of nail bed of toe ? ?- cephALEXin (KEFLEX) 500 MG capsule; Take 1 capsule (500 mg total) by mouth 3 (three) times daily for 10 days.  Dispense: 30 capsule; Refill: 0 ? ?4. Acute otitis media, unspecified otitis media type ? ? ?Follow up: ? ?Follow up as needed ? ? ? ? ?Ivonne Andrewonya S Yasmyn Bellisario, NP ?02/01/2022 ? ?

## 2022-02-01 NOTE — Patient Instructions (Addendum)
1. Injury of toe on left foot, initial encounter ? ?- cephALEXin (KEFLEX) 500 MG capsule; Take 1 capsule (500 mg total) by mouth 3 (three) times daily for 10 days.  Dispense: 30 capsule; Refill: 0 ? ?2. Left ear pain ? ?- ciprofloxacin-hydrocortisone (CIPRO HC) OTIC suspension; Place 3 drops into the left ear 2 (two) times daily.  Dispense: 10 mL; Refill: 0 ? ?3. Injury of nail bed of toe ? ?- cephALEXin (KEFLEX) 500 MG capsule; Take 1 capsule (500 mg total) by mouth 3 (three) times daily for 10 days.  Dispense: 30 capsule; Refill: 0 ? ?4. Acute otitis media, unspecified otitis media type ? ? ?Follow up: ? ?Follow up as needed ? ?

## 2022-02-01 NOTE — Assessment & Plan Note (Signed)
-   cephALEXin (KEFLEX) 500 MG capsule; Take 1 capsule (500 mg total) by mouth 3 (three) times daily for 10 days.  Dispense: 30 capsule; Refill: 0 ? ?2. Left ear pain ? ?- ciprofloxacin-hydrocortisone (CIPRO HC) OTIC suspension; Place 3 drops into the left ear 2 (two) times daily.  Dispense: 10 mL; Refill: 0 ? ?3. Injury of nail bed of toe ? ?- cephALEXin (KEFLEX) 500 MG capsule; Take 1 capsule (500 mg total) by mouth 3 (three) times daily for 10 days.  Dispense: 30 capsule; Refill: 0 ? ?4. Acute otitis media, unspecified otitis media type ? ? ?Follow up: ? ?Follow up as needed ?

## 2022-02-07 ENCOUNTER — Telehealth: Payer: Self-pay | Admitting: Nurse Practitioner

## 2022-02-07 NOTE — Telephone Encounter (Signed)
Pt came here to the office stating he needs his prescription is refilled.  ?Ear drops refill request ?

## 2022-02-08 NOTE — Telephone Encounter (Signed)
We can not prescribe antibiotic drops long term. He can OTC drops. If his ear continues to bother him I can refer him to ENT. Thanks. ?

## 2022-02-19 ENCOUNTER — Telehealth: Payer: Self-pay | Admitting: Nurse Practitioner

## 2022-02-19 NOTE — Telephone Encounter (Signed)
[facility] requesting refill on Tamsulosin 0.4mg  #30 ?[facility] on [address] ?

## 2022-02-21 ENCOUNTER — Telehealth: Payer: Self-pay

## 2022-02-21 NOTE — Telephone Encounter (Signed)
ciprofloxacin-hydrocortisone (CIPRO HC) OTIC suspension 34ml ? ?PA was started on 02/21/22. ?

## 2022-02-22 ENCOUNTER — Other Ambulatory Visit: Payer: Self-pay

## 2022-02-22 ENCOUNTER — Other Ambulatory Visit: Payer: Self-pay | Admitting: Nurse Practitioner

## 2022-02-22 ENCOUNTER — Ambulatory Visit: Payer: Medicaid Other | Admitting: Nurse Practitioner

## 2022-02-22 DIAGNOSIS — H6690 Otitis media, unspecified, unspecified ear: Secondary | ICD-10-CM

## 2022-02-22 DIAGNOSIS — R3915 Urgency of urination: Secondary | ICD-10-CM

## 2022-02-22 MED ORDER — TAMSULOSIN HCL 0.4 MG PO CAPS
0.8000 mg | ORAL_CAPSULE | Freq: Every day | ORAL | 0 refills | Status: AC
  Filled 2022-02-22: qty 60, 30d supply, fill #0

## 2022-02-22 MED ORDER — CIPROFLOXACIN-DEXAMETHASONE 0.3-0.1 % OT SUSP
4.0000 [drp] | Freq: Two times a day (BID) | OTIC | 0 refills | Status: AC
Start: 2022-02-22 — End: 2022-03-04

## 2022-02-22 NOTE — Telephone Encounter (Signed)
PA Case: [id number], Status: Denied. Notification: Completed. ?

## 2022-02-22 NOTE — Progress Notes (Addendum)
Message sent about refill ?

## 2022-02-22 NOTE — Telephone Encounter (Signed)
Will give 1 month supply, but patient needs to reschedule his 6 months appt. ?

## 2022-03-01 ENCOUNTER — Other Ambulatory Visit: Payer: Self-pay

## 2022-04-11 ENCOUNTER — Ambulatory Visit (INDEPENDENT_AMBULATORY_CARE_PROVIDER_SITE_OTHER): Payer: Medicaid Other | Admitting: Nurse Practitioner

## 2022-04-11 ENCOUNTER — Encounter: Payer: Self-pay | Admitting: Nurse Practitioner

## 2022-04-11 VITALS — BP 135/90 | HR 60 | Temp 97.8°F | Ht 62.5 in | Wt 175.2 lb

## 2022-04-11 DIAGNOSIS — I10 Essential (primary) hypertension: Secondary | ICD-10-CM

## 2022-04-11 DIAGNOSIS — H9193 Unspecified hearing loss, bilateral: Secondary | ICD-10-CM | POA: Diagnosis not present

## 2022-04-11 DIAGNOSIS — E782 Mixed hyperlipidemia: Secondary | ICD-10-CM | POA: Diagnosis not present

## 2022-04-11 DIAGNOSIS — R053 Chronic cough: Secondary | ICD-10-CM | POA: Diagnosis not present

## 2022-04-11 MED ORDER — OMEPRAZOLE 20 MG PO CPDR: 20.0000 mg | DELAYED_RELEASE_CAPSULE | Freq: Every day | ORAL | 3 refills | Status: DC

## 2022-04-11 MED ORDER — MONTELUKAST SODIUM 10 MG PO TABS: 10.0000 mg | ORAL_TABLET | Freq: Every day | ORAL | 3 refills | Status: DC

## 2022-04-11 NOTE — Assessment & Plan Note (Signed)
-   DG Chest 2 View; Future - omeprazole (PRILOSEC) 20 MG capsule; Take 1 capsule (20 mg total) by mouth daily.  Dispense: 30 capsule; Refill: 3 - montelukast (SINGULAIR) 10 MG tablet; Take 1 tablet (10 mg total) by mouth at bedtime.  Dispense: 30 tablet; Refill: 3  2. Decreased hearing of both ears  - Ambulatory referral to ENT  3. Mixed hyperlipidemia  - CBC - Comprehensive metabolic panel - Lipid Panel  4. Primary hypertension  - CBC - Comprehensive metabolic panel - Lipid Panel  Follow up:  Follow up in 1 month - chronic cough

## 2022-04-11 NOTE — Patient Instructions (Signed)
1. Chronic cough  - DG Chest 2 View; Future - omeprazole (PRILOSEC) 20 MG capsule; Take 1 capsule (20 mg total) by mouth daily.  Dispense: 30 capsule; Refill: 3 - montelukast (SINGULAIR) 10 MG tablet; Take 1 tablet (10 mg total) by mouth at bedtime.  Dispense: 30 tablet; Refill: 3  2. Decreased hearing of both ears  - Ambulatory referral to ENT  3. Mixed hyperlipidemia  - CBC - Comprehensive metabolic panel - Lipid Panel  4. Primary hypertension  - CBC - Comprehensive metabolic panel - Lipid Panel  Follow up:  Follow up in 1 month - chronic cough

## 2022-04-11 NOTE — Progress Notes (Signed)
@Patient  ID: , male    DOB: Mar 11, 1961, 61 y.o.   MRN: [id number]  Chief Complaint  Patient presents with   Cough    Pt stated he has been coughing non stop for a month    Referring provider: [id number], NP   HPI  Manuel Brooks presents for follow up. He  has a past medical history of Allergy, CAP (community acquired pneumonia) (01/2019), Elevated liver enzymes (10/2019), Exertional shortness of breath (04/2019), GERD (gastroesophageal reflux disease), H. pylori infection (03/2020), Hyperlipidemia (10/2019), Hypertension, Leucopenia, and Vitamin D deficiency (11/2019).   He is in today for follow up on HTN. He is doing well. He is compliant with his medication. He works full time. He tries to eat a low sodium diet.  His risk factors: dyslipidemia, hypertension, male gender, and obesity (BMI >= 30 kg/m2). Use of agents associated with hypertension Denies headache, dizziness, visual changes, shortness of breath, dyspnea on exertion, chest pain, nausea, vomiting or any edema.    Patient presents for evaluation of lipids. Compliance with treatment thus far has been good. A repeat fasting lipid profile was done. The patient does not use medications that may worsen dyslipidemias (corticosteroids, progestins, anabolic steroids, diuretics, beta-blockers, amiodarone, cyclosporine, olanzapine). The patient exercises occasionally. Risk factor 2  Patient complains today of cough x1 month. Does have inhaler to use as needed. Worse at night.   Denies f/c/s, n/v/d, hemoptysis, PND, leg swelling Denies chest pain or edema    No Known Allergies  Immunization History  Administered Date(s) Administered   Influenza,inj,Quad PF,6+ Mos 07/22/2014, 08/02/2016, 10/10/2018, 09/12/2020   Tdap 05/20/2018    Past Medical History:  Diagnosis Date   Allergy    CAP (community acquired pneumonia) 01/2019   Elevated liver enzymes 10/2019   Exertional shortness of breath 04/2019   GERD (gastroesophageal  reflux disease)    H. pylori infection 03/2020   Hyperlipidemia 10/2019   Hypertension    Leucopenia    Vitamin D deficiency 11/2019    Tobacco History: Social History   Tobacco Use  Smoking Status Former   Packs/day: 0.50   Types: Cigarettes   Start date: 10/22/1988   Quit date: 10/22/1998   Years since quitting: 23.4  Smokeless Tobacco Never   Counseling given: Not Answered   Outpatient Encounter Medications as of 04/11/2022  Medication Sig   albuterol (VENTOLIN HFA) 108 (90 Base) MCG/ACT inhaler Inhale 2 puffs into the lungs every 6 (six) hours as needed for wheezing or shortness of breath.   atorvastatin (LIPITOR) 10 MG tablet Take 1 tablet (10 mg total) by mouth daily.   cetirizine (ZYRTEC) 10 MG tablet Take 1 tablet (10 mg total) by mouth daily.   fluticasone (FLONASE) 50 MCG/ACT nasal spray Place 2 sprays into both nostrils daily.   lisinopril (ZESTRIL) 5 MG tablet Take 1 tablet (5 mg total) by mouth daily.   montelukast (SINGULAIR) 10 MG tablet Take 1 tablet (10 mg total) by mouth at bedtime.   omeprazole (PRILOSEC) 20 MG capsule Take 1 capsule (20 mg total) by mouth daily.   Zinc 100 MG TABS Take 100 mg by mouth daily.   hydrocortisone (ANUSOL-HC) 2.5 % rectal cream Place 1 application rectally 2 (two) times daily. (Patient not taking: Reported on 04/11/2022)   hydrocortisone cream (PREPARATION H) 1 % Apply 1 application topically 2 (two) times daily. (Patient not taking: Reported on 04/11/2022)   ibuprofen (ADVIL) 800 MG tablet TAKE 1 TABLET (800 MG TOTAL) BY MOUTH EVERY 8 (  EIGHT) HOURS AS NEEDED. (Patient not taking: Reported on 04/11/2022)   nitroGLYCERIN (NITRODUR - DOSED IN MG/24 HR) 0.2 mg/hr patch Apply 1/4 patch to the affected area every 24 hours (Patient not taking: Reported on 04/11/2022)   pantoprazole (PROTONIX) 40 MG tablet TAKE 1 TABLET(40 MG) BY MOUTH DAILY   prednisoLONE acetate (PRED FORTE) 1 % ophthalmic suspension SMARTSIG:In Eye(s) (Patient not taking:  Reported on 04/11/2022)   RESTASIS 0.05 % ophthalmic emulsion  (Patient not taking: Reported on 04/11/2022)   vitamin C (ASCORBIC ACID) 250 MG tablet Take 1 tablet (250 mg total) by mouth daily. (Patient not taking: Reported on 04/11/2022)   vitamin E 180 MG (400 UNITS) capsule Take 1 capsule (400 Units total) by mouth daily. (Patient not taking: Reported on 04/11/2022)   No facility-administered encounter medications on file as of 04/11/2022.     Review of Systems  Review of Systems  Constitutional: Negative.   HENT: Negative.    Respiratory:  Positive for cough.   Cardiovascular: Negative.   Gastrointestinal: Negative.   Allergic/Immunologic: Negative.   Neurological: Negative.   Psychiatric/Behavioral: Negative.         Physical Exam  BP 135/90 (BP Location: Left Arm, Patient Position: Sitting, Cuff Size: Normal)   Pulse 60   Temp 97.8 F (36.6 C)   Ht 5' 2.5" (1.588 m)   Wt 175 lb 3.2 oz (79.5 kg)   SpO2 99%   BMI 31.53 kg/m   Wt Readings from Last 5 Encounters:  04/11/22 175 lb 3.2 oz (79.5 kg)  02/01/22 174 lb 4 oz (79 kg)  10/11/21 182 lb 12.8 oz (82.9 kg)  08/25/21 181 lb (82.1 kg)  07/26/21 186 lb 0.8 oz (84.4 kg)     Physical Exam Vitals and nursing note reviewed.  Constitutional:      General: He is not in acute distress.    Appearance: He is well-developed.  Cardiovascular:     Rate and Rhythm: Normal rate and regular rhythm.  Pulmonary:     Effort: Pulmonary effort is normal.     Breath sounds: Normal breath sounds.  Skin:    General: Skin is warm and dry.  Neurological:     Mental Status: He is alert and oriented to person, place, and time.      Lab Results:  CBC    Component Value Date/Time   WBC 3.2 (L) 07/26/2021 1033   WBC 1.2 (LL) 10/16/2019 0956   RBC 5.72 07/26/2021 1033   RBC 5.54 10/16/2019 0956   HGB 15.6 07/26/2021 1033   HGB 14.9 09/27/2011 1015   HCT 46.1 07/26/2021 1033   HCT 44.6 09/27/2011 1015   PLT 263 07/26/2021  1033   MCV 81 07/26/2021 1033   MCV 82.4 09/27/2011 1015   MCH 27.3 07/26/2021 1033   MCH 26.9 10/16/2019 0956   MCHC 33.8 07/26/2021 1033   MCHC 33.0 10/16/2019 0956   RDW 13.6 07/26/2021 1033   RDW 14.3 09/27/2011 1015   LYMPHSABS 1.6 07/26/2021 1033   LYMPHSABS 1.9 09/27/2011 1015   MONOABS 0.2 10/16/2019 0956   MONOABS 0.4 09/27/2011 1015   EOSABS 0.3 07/26/2021 1033   BASOSABS 0.0 07/26/2021 1033   BASOSABS 0.0 09/27/2011 1015    BMET    Component Value Date/Time   NA 141 08/09/2021 0856   K 4.4 08/09/2021 0856   CL 103 08/09/2021 0856   CO2 25 07/26/2021 1033   GLUCOSE 94 08/09/2021 0856   GLUCOSE 108 (H) 10/16/2019 9622  BUN 17 08/09/2021 0856   CREATININE 1.08 08/09/2021 0856   CREATININE 1.00 10/30/2016 0839   CALCIUM 9.4 08/09/2021 0856   GFRNONAA 91 11/20/2019 1501   GFRNONAA 83 10/30/2016 0839   GFRAA 105 11/20/2019 1501   GFRAA >89 10/30/2016 0839    BNP No results found for: "BNP"  ProBNP No results found for: "PROBNP"  Imaging: No results found.   Assessment & Plan:   Cough - DG Chest 2 View; Future - omeprazole (PRILOSEC) 20 MG capsule; Take 1 capsule (20 mg total) by mouth daily.  Dispense: 30 capsule; Refill: 3 - montelukast (SINGULAIR) 10 MG tablet; Take 1 tablet (10 mg total) by mouth at bedtime.  Dispense: 30 tablet; Refill: 3  2. Decreased hearing of both ears  - Ambulatory referral to ENT  3. Mixed hyperlipidemia  - CBC - Comprehensive metabolic panel - Lipid Panel  4. Primary hypertension  - CBC - Comprehensive metabolic panel - Lipid Panel  Follow up:  Follow up in 1 month - chronic cough     Manuel Andrew, NP 04/11/2022

## 2022-04-12 ENCOUNTER — Other Ambulatory Visit: Payer: Self-pay | Admitting: Nurse Practitioner

## 2022-04-12 DIAGNOSIS — D709 Neutropenia, unspecified: Secondary | ICD-10-CM

## 2022-04-12 LAB — CBC
Hematocrit: 46.1 % (ref 37.5–51.0)
Hemoglobin: 15.2 g/dL (ref 13.0–17.7)
MCH: 27.1 pg (ref 26.6–33.0)
MCHC: 33 g/dL (ref 31.5–35.7)
MCV: 82 fL (ref 79–97)
Platelets: 260 10*3/uL (ref 150–450)
RBC: 5.61 x10E6/uL (ref 4.14–5.80)
RDW: 14 % (ref 11.6–15.4)
WBC: 3.1 10*3/uL — ABNORMAL LOW (ref 3.4–10.8)

## 2022-04-12 LAB — COMPREHENSIVE METABOLIC PANEL
ALT: 17 IU/L (ref 0–44)
AST: 24 IU/L (ref 0–40)
Albumin/Globulin Ratio: 1.7 (ref 1.2–2.2)
Albumin: 4.5 g/dL (ref 3.8–4.8)
Alkaline Phosphatase: 88 IU/L (ref 44–121)
BUN/Creatinine Ratio: 12 (ref 10–24)
BUN: 12 mg/dL (ref 8–27)
Bilirubin Total: 0.3 mg/dL (ref 0.0–1.2)
CO2: 25 mmol/L (ref 20–29)
Calcium: 9.4 mg/dL (ref 8.6–10.2)
Chloride: 99 mmol/L (ref 96–106)
Creatinine, Ser: 1.02 mg/dL (ref 0.76–1.27)
Globulin, Total: 2.6 g/dL (ref 1.5–4.5)
Glucose: 95 mg/dL (ref 70–99)
Potassium: 4.5 mmol/L (ref 3.5–5.2)
Sodium: 138 mmol/L (ref 134–144)
Total Protein: 7.1 g/dL (ref 6.0–8.5)
eGFR: 83 mL/min/{1.73_m2} (ref >59)

## 2022-04-12 LAB — LIPID PANEL
Chol/HDL Ratio: 2.8 ratio (ref 0.0–5.0)
Cholesterol, Total: 147 mg/dL (ref 100–199)
HDL: 52 mg/dL (ref >39)
LDL Chol Calc (NIH): 80 mg/dL (ref 0–99)
Triglycerides: 77 mg/dL (ref 0–149)
VLDL Cholesterol Cal: 15 mg/dL (ref 5–40)

## 2022-04-16 ENCOUNTER — Telehealth: Payer: Self-pay | Admitting: Physician Assistant

## 2022-05-08 ENCOUNTER — Inpatient Hospital Stay: Payer: Medicaid Other | Admitting: Physician Assistant

## 2022-05-08 ENCOUNTER — Inpatient Hospital Stay: Payer: Medicaid Other

## 2022-05-18 ENCOUNTER — Ambulatory Visit: Payer: Medicaid Other | Admitting: Nurse Practitioner

## 2022-05-18 ENCOUNTER — Encounter: Payer: Self-pay | Admitting: Nurse Practitioner

## 2022-05-18 VITALS — BP 140/88 | HR 62 | Resp 18

## 2022-05-18 DIAGNOSIS — R053 Chronic cough: Secondary | ICD-10-CM | POA: Diagnosis not present

## 2022-05-18 NOTE — Assessment & Plan Note (Signed)
-   Ambulatory referral to Pulmonology   Follow up:  Follow up in 6 months or sooner if needed

## 2022-05-18 NOTE — Addendum Note (Signed)
Addended by: Ivonne Andrew on: 05/18/2022 09:51 AM   Modules accepted: Orders

## 2022-05-18 NOTE — Patient Instructions (Signed)
1. Chronic cough  - Ambulatory referral to Pulmonology   Follow up:  Follow up in 6 months or sooner if needed

## 2022-05-18 NOTE — Progress Notes (Signed)
@Patient  ID: , male    DOB: 1960/12/12, 61 y.o.   MRN: [id number]  No chief complaint on file.   Referring provider: [id number], NP   HPI  Manuel Brooks presents for follow up. He  has a past medical history of Allergy, CAP (community acquired pneumonia) (01/2019), Elevated liver enzymes (10/2019), Exertional shortness of breath (04/2019), GERD (gastroesophageal reflux disease), H. pylori infection (03/2020), Hyperlipidemia (10/2019), Hypertension, Leucopenia, and Vitamin D deficiency (11/2019).   Patient presents today for follow-up on chronic cough.  He states that he is still coughing and has not improved with treatment.  Patient did not go to get his x-ray done that was ordered at last visit.  It was noted that his white blood cell count was low at last visit but patient states that he has been evaluated for this in the past by hematology so he did not go for evaluation for leukopenia either.  We will place referral for pulmonary for patient to follow-up with Dr. 12/2019 for chronic cough. Denies f/c/s, n/v/d, hemoptysis, PND, leg swelling Denies chest pain or edema  Note from last visit:  Cough - DG Chest 2 View; Future - did not complete  - omeprazole (PRILOSEC) 20 MG capsule; Take 1 capsule (20 mg total) by mouth daily.  Dispense: 30 capsule; Refill: 3 - states did not help  - montelukast (SINGULAIR) 10 MG tablet; Take 1 tablet (10 mg total) by mouth at bedtime.  Dispense: 30 tablet; Refill: 3 - states did not help  No Known Allergies  Immunization History  Administered Date(s) Administered   Influenza,inj,Quad PF,6+ Mos 07/22/2014, 08/02/2016, 10/10/2018, 09/12/2020   Tdap 05/20/2018    Past Medical History:  Diagnosis Date   Allergy    CAP (community acquired pneumonia) 01/2019   Elevated liver enzymes 10/2019   Exertional shortness of breath 04/2019   GERD (gastroesophageal reflux disease)    H. pylori infection 03/2020   Hyperlipidemia 10/2019   Hypertension     Leucopenia    Vitamin D deficiency 11/2019    Tobacco History: Social History   Tobacco Use  Smoking Status Former   Packs/day: 0.50   Types: Cigarettes   Start date: 10/22/1988   Quit date: 10/22/1998   Years since quitting: 23.5  Smokeless Tobacco Never   Counseling given: Not Answered   Outpatient Encounter Medications as of 05/18/2022  Medication Sig   albuterol (VENTOLIN HFA) 108 (90 Base) MCG/ACT inhaler Inhale 2 puffs into the lungs every 6 (six) hours as needed for wheezing or shortness of breath.   atorvastatin (LIPITOR) 10 MG tablet Take 1 tablet (10 mg total) by mouth daily.   cetirizine (ZYRTEC) 10 MG tablet Take 1 tablet (10 mg total) by mouth daily.   fluticasone (FLONASE) 50 MCG/ACT nasal spray Place 2 sprays into both nostrils daily.   hydrocortisone (ANUSOL-HC) 2.5 % rectal cream Place 1 application rectally 2 (two) times daily. (Patient not taking: Reported on 04/11/2022)   hydrocortisone cream (PREPARATION H) 1 % Apply 1 application topically 2 (two) times daily. (Patient not taking: Reported on 04/11/2022)   ibuprofen (ADVIL) 800 MG tablet TAKE 1 TABLET (800 MG TOTAL) BY MOUTH EVERY 8 (EIGHT) HOURS AS NEEDED. (Patient not taking: Reported on 04/11/2022)   lisinopril (ZESTRIL) 5 MG tablet Take 1 tablet (5 mg total) by mouth daily.   montelukast (SINGULAIR) 10 MG tablet Take 1 tablet (10 mg total) by mouth at bedtime.   nitroGLYCERIN (NITRODUR - DOSED IN MG/24 HR) 0.2 mg/hr  patch Apply 1/4 patch to the affected area every 24 hours (Patient not taking: Reported on 04/11/2022)   omeprazole (PRILOSEC) 20 MG capsule Take 1 capsule (20 mg total) by mouth daily.   pantoprazole (PROTONIX) 40 MG tablet TAKE 1 TABLET(40 MG) BY MOUTH DAILY   prednisoLONE acetate (PRED FORTE) 1 % ophthalmic suspension SMARTSIG:In Eye(s) (Patient not taking: Reported on 04/11/2022)   RESTASIS 0.05 % ophthalmic emulsion  (Patient not taking: Reported on 04/11/2022)   vitamin C (ASCORBIC ACID) 250 MG  tablet Take 1 tablet (250 mg total) by mouth daily. (Patient not taking: Reported on 04/11/2022)   vitamin E 180 MG (400 UNITS) capsule Take 1 capsule (400 Units total) by mouth daily. (Patient not taking: Reported on 04/11/2022)   Zinc 100 MG TABS Take 100 mg by mouth daily.   No facility-administered encounter medications on file as of 05/18/2022.     Review of Systems  Review of Systems  Constitutional: Negative.   HENT: Negative.    Respiratory:  Positive for cough.   Cardiovascular: Negative.   Gastrointestinal: Negative.   Allergic/Immunologic: Negative.   Neurological: Negative.   Psychiatric/Behavioral: Negative.         Physical Exam  BP 140/88   Pulse 62   Resp 18   Wt Readings from Last 5 Encounters:  04/11/22 175 lb 3.2 oz (79.5 kg)  02/01/22 174 lb 4 oz (79 kg)  10/11/21 182 lb 12.8 oz (82.9 kg)  08/25/21 181 lb (82.1 kg)  07/26/21 186 lb 0.8 oz (84.4 kg)     Physical Exam Vitals and nursing note reviewed.  Constitutional:      General: He is not in acute distress.    Appearance: He is well-developed.  Cardiovascular:     Rate and Rhythm: Normal rate and regular rhythm.  Pulmonary:     Effort: Pulmonary effort is normal.     Breath sounds: Normal breath sounds.  Skin:    General: Skin is warm and dry.  Neurological:     Mental Status: He is alert and oriented to person, place, and time.      Lab Results:  CBC    Component Value Date/Time   WBC 3.1 (L) 04/11/2022 0848   WBC 1.2 (LL) 10/16/2019 0956   RBC 5.61 04/11/2022 0848   RBC 5.54 10/16/2019 0956   HGB 15.2 04/11/2022 0848   HGB 14.9 09/27/2011 1015   HCT 46.1 04/11/2022 0848   HCT 44.6 09/27/2011 1015   PLT 260 04/11/2022 0848   MCV 82 04/11/2022 0848   MCV 82.4 09/27/2011 1015   MCH 27.1 04/11/2022 0848   MCH 26.9 10/16/2019 0956   MCHC 33.0 04/11/2022 0848   MCHC 33.0 10/16/2019 0956   RDW 14.0 04/11/2022 0848   RDW 14.3 09/27/2011 1015   LYMPHSABS 1.6 07/26/2021 1033    LYMPHSABS 1.9 09/27/2011 1015   MONOABS 0.2 10/16/2019 0956   MONOABS 0.4 09/27/2011 1015   EOSABS 0.3 07/26/2021 1033   BASOSABS 0.0 07/26/2021 1033   BASOSABS 0.0 09/27/2011 1015    BMET    Component Value Date/Time   NA 138 04/11/2022 0848   K 4.5 04/11/2022 0848   CL 99 04/11/2022 0848   CO2 25 04/11/2022 0848   GLUCOSE 95 04/11/2022 0848   GLUCOSE 108 (H) 10/16/2019 0956   BUN 12 04/11/2022 0848   CREATININE 1.02 04/11/2022 0848   CREATININE 1.00 10/30/2016 0839   CALCIUM 9.4 04/11/2022 0848   GFRNONAA 91 11/20/2019 1501  GFRNONAA 83 10/30/2016 0839   GFRAA 105 11/20/2019 1501   GFRAA >89 10/30/2016 0839    BNP No results found for: "BNP"  ProBNP No results found for: "PROBNP"  Imaging: No results found.   Assessment & Plan:   Cough - Ambulatory referral to Pulmonology   Follow up:  Follow up in 6 months or sooner if needed     Manuel Andrew, NP 05/18/2022

## 2022-06-13 ENCOUNTER — Ambulatory Visit (INDEPENDENT_AMBULATORY_CARE_PROVIDER_SITE_OTHER): Payer: Medicaid Other

## 2022-06-13 ENCOUNTER — Ambulatory Visit (INDEPENDENT_AMBULATORY_CARE_PROVIDER_SITE_OTHER): Payer: Medicaid Other | Admitting: Internal Medicine

## 2022-06-13 ENCOUNTER — Encounter: Payer: Self-pay | Admitting: Internal Medicine

## 2022-06-13 DIAGNOSIS — R053 Chronic cough: Secondary | ICD-10-CM

## 2022-06-13 DIAGNOSIS — I10 Essential (primary) hypertension: Secondary | ICD-10-CM | POA: Diagnosis not present

## 2022-06-13 DIAGNOSIS — R059 Cough, unspecified: Secondary | ICD-10-CM | POA: Diagnosis not present

## 2022-06-13 MED ORDER — VALSARTAN 80 MG PO TABS: 80.0000 mg | ORAL_TABLET | Freq: Every day | ORAL | 11 refills | Status: DC

## 2022-06-13 MED ORDER — FAMOTIDINE 20 MG PO TABS: ORAL_TABLET | ORAL | 11 refills | Status: DC

## 2022-06-13 NOTE — Assessment & Plan Note (Addendum)
D/c acei 06/13/2022 due to cough / refractory HB   In the best review of chronic cough to date ( NEJM 2016 375 1610-9604) ,  ACEi are now felt to cause cough in up to  20% of pts which is a 4 fold increase from previous reports and does not include the variety of non-specific complaints we see in pulmonary clinic in pts on ACEi but previously attributed to another dx like  Copd/asthma and  include PNDS, throat congestion, "bronchitis", unexplained dyspnea and noct "strangling" sensations, and hoarseness/gagging, but also  atypical /refractory GERD symptoms like dysphagia and "bad heartburn"   The only way I know  to prove this is not an "ACEi Case" is a trial off ACEi x a minimum of 4-6 weeks then regroup.   >>> changed to valsartan 80 mg one daily and f/u in 4 weeks          Each maintenance medication was reviewed in detail including emphasizing most importantly the difference between maintenance and prns and under what circumstances the prns are to be triggered using an action plan format where appropriate.  Total time for H and P, chart review, counseling and generating customized AVS unique to this office visit with help of Therapist, nutritional / same day charting >  45 min with pt new to me

## 2022-06-13 NOTE — Patient Instructions (Addendum)
Please remember to go to the  x-ray department  for your tests - we will call you with the results when they are available    Stop lisinopril and start valsartan 80 mg one daily and your cough should gradually improve   Protonix 40 mg Take 30-60 min before first meal of the day and pepcid 20 mg after supper until return   GERD (REFLUX)  is an extremely common cause of respiratory symptoms just like yours , many times with no obvious heartburn at all.    It can be treated with medication, but also with lifestyle changes including elevation of the head of your bed (ideally with 6 -8inch blocks under the headboard of your bed),  Smoking cessation, avoidance of late meals, excessive alcohol, and avoid fatty foods, chocolate, peppermint, colas, red wine, and acidic juices such as orange juice.  NO MINT OR MENTHOL PRODUCTS SO NO COUGH DROPS  USE SUGARLESS CANDY INSTEAD (Jolley ranchers or Stover's or Life Savers) or even ice chips will also do - the key is to swallow to prevent all throat clearing. NO OIL BASED VITAMINS - use powdered substitutes.  Avoid fish oil when coughing.    Please schedule a follow up office visit in 4 weeks, call sooner if needed with all medications / over the counter in hand so we can verify exactly what you are taking. This includes all medications from all doctors and over the counters     ?????? ??????? ?????? ??????? - ?????? ?? ??????? ???????? ????? ???? ?????  ???? ?? ??????? ?????????? ????? ?????? ????????? 80 ???? ??? ????? ?????? ??????? ?????? ????????  ????????? 40 ???: ????? 30-60 ????? ??? ?????? ?????? ?? ????? ??????? 20 ??? ??? ?????? ??? ??????.  ??? ???????? ?????? ??????? (????????) ????? ?????? ???? ?????? ?????? ??????? ?????? ??? ??????? ??? ???? ?? ??????? ?? ???? ???? ????? ??? ???????.   ???? ????? ????????? ???? ????? ?????? ??? ?????? ??? ?? ??? ??? ??? ????? (?? ?????? ??? ??? ?? 6 ??? 8 ????? ??? ????? ??????? ??????)? ???????? ?? ????????  ????? ??????? ????????? ???????? ?? ????? ??????? ????? ??????? ???????. ??????????? ???????? ??????? ??????? ?????? ???????? ??????? ??? ???? ????????. ?? ????? ??? ?????? ??????? ?? ???????? ???? ?? ???? ????? ?????? ?????? ?????? ??????? ?? ????? ????? ?? ??? (???? ??????? ?? Stover's ?? Life Savers) ?? ??? ????? ????? ???? ?????? ????? - ???????? ?? ????? ???? ????? ????? ???????. ?? ????? ??? ????????? ????? - ??????? ????? ??????. ???? ??? ????? ??? ??????.   ???? ????? ???? ?????? ???? ???????? ???? 4 ??????? ???????? ?????? ??? ??? ????? ?? ???? ???? ??????? / ???? ?? ????? ??? ???? ???? ?? ?????? ???? ??? ????? ?? ?????? ?????? ??? ???????. ???? ???? ???? ??????? ?? ???? ??????? ???????? ??????? ???? ???? ???? yurjaa tadhakur aldhahab 'iilaa qism al'ashieat alsiyniat li'iijra' fuhusatik - wasanatasil bik li'iiblaghik bialnatayij eindama takun mutahatan tawaqaf ean astikhdam lizinubril wabda bitanawul falsaaratan 80 maljam marat wahidatan ywmyan wasayatahasan alsueal tdryjyan brutuniks 40 milagh: tanawal 30-60 daqiqatan qabl alwajbat al'uwlaa fi alyawm wabibsid 20 mulgh baed aleasha' hataa aleawdati. yueadu alairtijae almaedi almariyiyu (alairtijaei) sbban shayean jdan li'aerad aljihaz altanafusii tmaman mithl 'aeradika, wafi kathir min al'ahyan la tujad harqat wadihat ealaa al'iitlaqi. yumkin eilajuh bial'adwiati, walakin aydan bitaghyir namat alhayaat bima fi dhalik rafae ras saririk (man al'afdal wade kutal min 6 'iilaa 8 bwsat taht allawh al'amamii lisarirk), wal'iiqlae ean altadkhini, watajanub alwajabat almuta'akhirati, wal'iifrat fi tanawul alkuhula, watajanub al'ateimat aldihniati. walshuwkulatat walnaenae walkula walnabidh  al'ahmar waleasayir alhamdiat mithl easir alburtiqal. la tahtawi ealaa muntajat alnaenae 'aw almanthuli, lidhalik la tujad qatarat lilsieal astakhdim alhalwaa alkhaliat min alsukar bdlaan min dhalik (mrabiy almashiat 'aw Stover's 'aw Life Savers) 'aw hataa raqayiq  althalj satufi bialgharad aydan - walmiftah hu albale limane tathir alhalq bialkamili. la yahtawi ealaa fitaminat zaytiat - astakhdimi bidayil mashuqatin. tajanub zayt alsamk eind alsieali. yurjaa tahdid [facility] almutabaeat khilal 4 'asabiea, walaitisal eajlaan 'iidha lazim al'amr mae wujud jamie al'adwiat / alati la tahtaj 'iilaa wasfat tibiyat fi mutanawal alyad hataa natamakan min altahaquq bialdabt mimaa tatanawaluhu. Manuel Brooks yashmal jamie al'adwiat min jamie al'atibaa' wal'adwiat almutahat bidun wasfat tibiya

## 2022-06-13 NOTE — Progress Notes (Signed)
Manuel Brooks, male    DOB: 03/27/1961   MRN: [id number]   Brief patient profile:  61   yo [name] quit smoking 2001  in [place] since  2000 last international travel 2019  referred to pulmonary clinic 06/13/2022 by Angus Seller NP  for cough since covid   April 2021       History of Present Illness  06/13/2022  Pulmonary/ 1st office eval/Jeffie Spivack  Chief Complaint  Patient presents with   Consult    Clearing throat after talking, burning in chest  Dyspnea:  only with fast walking  Cough: talking > severe cough to point of gagging / dry with overt HB as well  Sleep: not coughing hs / bed flat 2 pillows  SABA use: not using - does not help   No obvious other patterns in day to day or daytime variability or assoc excess/ purulent sputum or mucus plugs or hemoptysis or cp or chest tightness, subjective wheeze or overt sinus  symptoms.   Sleeping as above without nocturnal  or early am exacerbation  of respiratory  c/o's or need for noct saba. Also denies any obvious fluctuation of symptoms with weather or environmental changes or other aggravating or alleviating factors except as outlined above   No unusual exposure hx or h/o childhood pna/ asthma or knowledge of premature birth.  Current Allergies, Complete Past Medical History, Past Surgical History, Family History, and Social History were reviewed in [employer] record.  ROS  The following are not active complaints unless bolded Hoarseness, sore throat, dysphagia, dental problems, itching, sneezing,  nasal congestion or discharge of excess mucus or purulent secretions, ear ache,   fever, chills, sweats, unintended wt loss or wt gain, classically pleuritic or exertional cp,  orthopnea pnd or arm/hand swelling  or leg swelling, presyncope, palpitations, abdominal pain, anorexia, nausea, vomiting, diarrhea  or change in bowel habits or change in bladder habits, change in stools or change in urine, dysuria, hematuria,  rash,  arthralgias, visual complaints, headache, numbness, weakness or ataxia or problems with walking or coordination,  change in mood or  memory.          Past Medical History:  Diagnosis Date   Allergy    CAP (community acquired pneumonia) 01/2019   Elevated liver enzymes 10/2019   Exertional shortness of breath 04/2019   GERD (gastroesophageal reflux disease)    H. pylori infection 03/2020   Hyperlipidemia 10/2019   Hypertension    Leucopenia    Vitamin D deficiency 11/2019    Outpatient Medications Prior to Visit  Medication Sig Dispense Refill   cetirizine (ZYRTEC) 10 MG tablet Take 1 tablet (10 mg total) by mouth daily. 90 tablet 3   montelukast (SINGULAIR) 10 MG tablet Take 1 tablet (10 mg total) by mouth at bedtime. 30 tablet 3   omeprazole (PRILOSEC) 20 MG capsule Take 1 capsule (20 mg total) by mouth daily. 30 capsule 3   pantoprazole (PROTONIX) 40 MG tablet TAKE 1 TABLET(40 MG) BY MOUTH DAILY 30 tablet 3   albuterol (VENTOLIN HFA) 108 (90 Base) MCG/ACT inhaler Inhale 2 puffs into the lungs every 6 (six) hours as needed for wheezing or shortness of breath. (Patient not taking: Reported on 06/13/2022) 18 g 6   atorvastatin (LIPITOR) 10 MG tablet Take 1 tablet (10 mg total) by mouth daily. 30 tablet 11   fluticasone (FLONASE) 50 MCG/ACT nasal spray Place 2 sprays into both nostrils daily. 9.9 g 11   hydrocortisone (  ANUSOL-HC) 2.5 % rectal cream Place 1 application rectally 2 (two) times daily. (Patient not taking: Reported on 04/11/2022) 30 g 3   hydrocortisone cream (PREPARATION H) 1 % Apply 1 application topically 2 (two) times daily. (Patient not taking: Reported on 04/11/2022) 30 g 11   ibuprofen (ADVIL) 800 MG tablet TAKE 1 TABLET (800 MG TOTAL) BY MOUTH EVERY 8 (EIGHT) HOURS AS NEEDED. (Patient not taking: Reported on 04/11/2022) 120 tablet 6   lisinopril (ZESTRIL) 5 MG tablet Take 1 tablet (5 mg total) by mouth daily. 30 tablet 11   nitroGLYCERIN (NITRODUR - DOSED IN MG/24 HR) 0.2  mg/hr patch Apply 1/4 patch to the affected area every 24 hours (Patient not taking: Reported on 04/11/2022) 30 patch 11   prednisoLONE acetate (PRED FORTE) 1 % ophthalmic suspension SMARTSIG:In Eye(s) (Patient not taking: Reported on 04/11/2022)     RESTASIS 0.05 % ophthalmic emulsion  (Patient not taking: Reported on 04/11/2022)     vitamin C (ASCORBIC ACID) 250 MG tablet Take 1 tablet (250 mg total) by mouth daily. (Patient not taking: Reported on 04/11/2022) 30 tablet 6   vitamin E 180 MG (400 UNITS) capsule Take 1 capsule (400 Units total) by mouth daily. (Patient not taking: Reported on 04/11/2022) 30 capsule 6   Zinc 100 MG TABS Take 100 mg by mouth daily.     No facility-administered medications prior to visit.     Objective:     BP 138/78 (BP Location: Left Arm, Patient Position: Sitting, Cuff Size: Normal)   Pulse 73   Temp 98.1 F (36.7 C) (Oral)   Ht 5\' 7"  (1.702 m)   Wt 176 lb 12.8 oz (80.2 kg)   SpO2 95% Comment: RA  BMI 27.69 kg/m   SpO2: 95 % (RA)  Amb mod hoarse    HEENT : Oropharynx  pristine     Nasal turbinates nl    NECK :  without  apparent JVD/ palpable Nodes/TM    LUNGS: no acc muscle use,  Nl contour chest which is clear to A and P bilaterally without cough on insp or exp maneuvers   CV:  RRR  no s3 or murmur or increase in P2, and no edema   ABD:  soft and nontender with nl inspiratory excursion in the supine position. No bruits or organomegaly appreciated   MS:  Nl gait/ ext warm without deformities Or obvious joint restrictions  calf tenderness, cyanosis or clubbing    SKIN: warm and dry without lesions    NEURO:  alert, approp, nl sensorium with  no motor or cerebellar deficits apparent.   CXR PA and Lateral:   06/13/2022 :    I personally reviewed images and impression is as follows:     Mild cm/ no acute changes     Assessment   Cough Onset p covid Arpil 2021 - d/c ACEi  06/13/2022   Pattern is classic Upper airway cough syndrome  (previously labeled PNDS),  is so named because it's frequently impossible to sort out how much is  CR/sinusitis with freq throat clearing (which can be related to primary GERD)   vs  causing  secondary (" extra esophageal")  GERD from wide swings in gastric pressure that occur with throat clearing, often  promoting self use of mint and menthol lozenges that reduce the lower esophageal sphincter tone and exacerbate the problem further in a cyclical fashion.   These are the same pts (now being labeled as having "irritable larynx syndrome" by  some cough centers) who not infrequently have a history of having failed to tolerate ace inhibitors,  dry powder inhalers or biphosphonates or report having atypical/extraesophageal reflux symptoms that don't respond to standard doses of PPI  and are easily confused as having aecopd or asthma flares by even experienced allergists/ pulmonologists (myself included).   >>> d/c acei/ max rx for gerd and regroup in 4 weeks  Essential hypertension D/c acei 06/13/2022 due to cough / refractory HB   In the best review of chronic cough to date ( NEJM 2016 375 [id number]) ,  ACEi are now felt to cause cough in up to  20% of pts which is a 4 fold increase from previous reports and does not include the variety of non-specific complaints we see in pulmonary clinic in pts on ACEi but previously attributed to another dx like  Copd/asthma and  include PNDS, throat congestion, "bronchitis", unexplained dyspnea and noct "strangling" sensations, and hoarseness/gagging, but also  atypical /refractory GERD symptoms like dysphagia and "bad heartburn"   The only way I know  to prove this is not an "ACEi Case" is a trial off ACEi x a minimum of 4-6 weeks then regroup.   >>> changed to valsartan 80 mg one daily and f/u in 4 weeks          Each maintenance medication was reviewed in detail including emphasizing most importantly the difference between maintenance and prns and under what  circumstances the prns are to be triggered using an action plan format where appropriate.  Total time for H and P, chart review, counseling and generating customized AVS unique to this office visit with help of Therapist, nutritional / same day charting >  45 min with pt new to me                   Sandrea Hughs, MD 06/13/2022

## 2022-06-13 NOTE — Assessment & Plan Note (Addendum)
Onset p covid Arpil 2021 - d/c ACEi  06/13/2022   Pattern is classic Upper airway cough syndrome (previously labeled PNDS),  is so named because it's frequently impossible to sort out how much is  CR/sinusitis with freq throat clearing (which can be related to primary GERD)   vs  causing  secondary (" extra esophageal")  GERD from wide swings in gastric pressure that occur with throat clearing, often  promoting self use of mint and menthol lozenges that reduce the lower esophageal sphincter tone and exacerbate the problem further in a cyclical fashion.   These are the same pts (now being labeled as having "irritable larynx syndrome" by some cough centers) who not infrequently have a history of having failed to tolerate ace inhibitors,  dry powder inhalers or biphosphonates or report having atypical/extraesophageal reflux symptoms that don't respond to standard doses of PPI  and are easily confused as having aecopd or asthma flares by even experienced allergists/ pulmonologists (myself included).   >>> d/c acei/ max rx for gerd and regroup in 4 weeks

## 2022-07-01 ENCOUNTER — Other Ambulatory Visit: Payer: Self-pay | Admitting: Nurse Practitioner

## 2022-07-01 DIAGNOSIS — I10 Essential (primary) hypertension: Secondary | ICD-10-CM

## 2022-07-01 DIAGNOSIS — E782 Mixed hyperlipidemia: Secondary | ICD-10-CM

## 2022-07-02 ENCOUNTER — Other Ambulatory Visit: Payer: Self-pay

## 2022-07-02 ENCOUNTER — Other Ambulatory Visit: Payer: Self-pay | Admitting: Nurse Practitioner

## 2022-07-02 DIAGNOSIS — E782 Mixed hyperlipidemia: Secondary | ICD-10-CM

## 2022-07-02 MED ORDER — ATORVASTATIN CALCIUM 10 MG PO TABS
10.0000 mg | ORAL_TABLET | Freq: Every day | ORAL | 11 refills | Status: DC
  Filled 2022-07-02: qty 30, 30d supply, fill #0

## 2022-07-09 ENCOUNTER — Other Ambulatory Visit: Payer: Self-pay

## 2022-07-10 ENCOUNTER — Telehealth: Payer: Self-pay | Admitting: Nurse Practitioner

## 2022-07-10 ENCOUNTER — Telehealth: Payer: Medicaid Other | Admitting: Nurse Practitioner

## 2022-07-10 NOTE — Telephone Encounter (Signed)
Attempted to call patient x 3 attempts for virtual visit. No answer. Left message for patient to return call to reschedule.

## 2022-07-11 ENCOUNTER — Encounter: Payer: Self-pay | Admitting: Internal Medicine

## 2022-07-11 ENCOUNTER — Ambulatory Visit (INDEPENDENT_AMBULATORY_CARE_PROVIDER_SITE_OTHER): Payer: Medicaid Other | Admitting: Internal Medicine

## 2022-07-11 DIAGNOSIS — I10 Essential (primary) hypertension: Secondary | ICD-10-CM | POA: Diagnosis not present

## 2022-07-11 DIAGNOSIS — R053 Chronic cough: Secondary | ICD-10-CM

## 2022-07-11 MED ORDER — PANTOPRAZOLE SODIUM 40 MG PO TBEC: 40.0000 mg | DELAYED_RELEASE_TABLET | Freq: Every day | ORAL | 2 refills | Status: DC

## 2022-07-11 NOTE — Assessment & Plan Note (Addendum)
Onset p covid Arpil 2021 - d/c ACEi  06/13/2022 and rx gerd ? Adherent>  Improved 07/11/2022 but not taking ppi as rec and now flaring in setting of what appears to be minor URI though can't excluded COVID  >>> isolation x 10 days since onset of flare and/or check for covid >>>  add protonix 40 mg  q am ac and f/u in 6 weeks    Upper airway cough syndrome (previously labeled PNDS),  is so named because it's frequently impossible to sort out how much is  CR/sinusitis with freq throat clearing (which can be related to primary GERD)   vs  causing  secondary (" extra esophageal")  GERD from wide swings in gastric pressure that occur with throat clearing, often  promoting self use of mint and menthol lozenges that reduce the lower esophageal sphincter tone and exacerbate the problem further in a cyclical fashion.   These are the same pts (now being labeled as having "irritable larynx syndrome" by some cough centers) who not infrequently have a history of having failed to tolerate ace inhibitors,  dry powder inhalers or biphosphonates or report having atypical/extraesophageal reflux symptoms that don't respond to standard doses of PPI  and are easily confused as having aecopd or asthma flares by even experienced allergists/ pulmonologists (myself included).   rec reviewed gerd diet/ instructions per interpreter emphasizing needs to stop clearing his throat if possible with use of hard rock candies not mint/menthol  F/u in 6 weeks with all meds in hand using a trust but verify approach to confirm accurate Medication  Reconciliation The principal here is that until we are certain that the  patients are doing what we've asked, it makes no sense to ask them to do more.

## 2022-07-11 NOTE — Progress Notes (Signed)
Manuel Brooks, male    DOB: 07/10/1961   MRN: [id number]   Brief patient profile:  61   yo [place] male quit smoking 2001  in [place] since  2000 last international travel 2019  referred to pulmonary clinic 06/13/2022 by Manuel Arms NP  for cough since covid   April 2021       History of Present Illness  06/13/2022  Pulmonary/ 1st office eval/Manuel Brooks  Chief Complaint  Patient presents with   Consult    Clearing throat after talking, burning in chest  Dyspnea:  only with fast walking  Cough: talking > severe cough to point of gagging / dry with overt HB as well  Sleep: not coughing hs / bed flat 2 pillows  SABA use: not using - does not help  Rec Stop lisinopril and start valsartan 80 mg one daily and your cough should gradually improve  Protonix 40 mg Take 30-60 min before first meal of the day and pepcid 20 mg after supper until return  GERD  diet  Please schedule a follow up office visit in 4 weeks, call sooner if needed with all medications / over the counter in hand      07/11/2022  f/u ov/Manuel Brooks re: cough since April 2021    maint on pepcid 20 mg p supper  and "asthma" med as needed  Chief Complaint  Patient presents with   Follow-up    Cough-better until 2 wks., cough-clear. Ago and got cold from son  Dyspnea:  walking fine  Cough: on smoke exp , min mucoid,was much better until "got a cold" from son Sleeping: none noct/ no am cough  SABA use: maybe once a week  02: none  Covid status:    vax x 3 / infected    No obvious day to day or daytime variability or assoc  purulent sputum or mucus plugs or hemoptysis or cp or chest tightness, subjective wheeze or overt sinus or hb symptoms.   Sleeping  without nocturnal  or early am exacerbation  of respiratory  c/o's or need for noct saba. Also denies any obvious fluctuation of symptoms with weather or environmental changes or other aggravating or alleviating factors except as outlined above   No unusual exposure hx or h/o childhood pna/  asthma or knowledge of premature birth.  Current Allergies, Complete Past Medical History, Past Surgical History, Family History, and Social History were reviewed in [employer] record.  ROS  The following are not active complaints unless bolded Hoarseness, sore throat, dysphagia, dental problems, itching, sneezing,  nasal congestion or discharge of excess mucus or purulent secretions, ear ache,   fever, chills, sweats, unintended wt loss or wt gain, classically pleuritic or exertional cp,  orthopnea pnd or arm/hand swelling  or leg swelling, presyncope, palpitations, abdominal pain, anorexia, nausea, vomiting, diarrhea  or change in bowel habits or change in bladder habits, change in stools or change in urine, dysuria, hematuria,  rash, arthralgias, visual complaints, headache, numbness, weakness or ataxia or problems with walking or coordination,  change in mood or  memory.        Current Meds  Medication Sig   famotidine (PEPCID) 20 MG tablet One after supper   fluticasone (FLONASE) 50 MCG/ACT nasal spray Place 2 sprays into both nostrils daily.   hydrocortisone (ANUSOL-HC) 2.5 % rectal cream Place 1 application rectally 2 (two) times daily.   hydrocortisone cream (PREPARATION H) 1 % Apply 1 application topically 2 (two) times daily.  valsartan (DIOVAN) 80 MG tablet Take 1 tablet (80 mg total) by mouth daily.          Past Medical History:  Diagnosis Date   Allergy    CAP (community acquired pneumonia) 01/2019   Elevated liver enzymes 10/2019   Exertional shortness of breath 04/2019   GERD (gastroesophageal reflux disease)    H. pylori infection 03/2020   Hyperlipidemia 10/2019   Hypertension    Leucopenia    Vitamin D deficiency 11/2019       Objective:     Wt Readings from Last 3 Encounters:  07/11/22 176 lb (79.8 kg)  06/13/22 176 lb 12.8 oz (80.2 kg)  04/11/22 175 lb 3.2 oz (79.5 kg)      Vital signs reviewed  07/11/2022  - Note at rest 02  sats  95% on RA   General appearance:    amb pleasant arabic male nad with occ vigorous throat clearing    HEENT : Oropharynx  pristine      Nasal turbinates nl    NECK :  without  apparent JVD/ palpable Nodes/TM    LUNGS: no acc muscle use,  Nl contour chest which is clear to A and P bilaterally without cough on insp or exp maneuvers   CV:  RRR  no s3 or murmur or increase in P2, and no edema   ABD:  soft and nontender with nl inspiratory excursion in the supine position. No bruits or organomegaly appreciated   MS:  Nl gait/ ext warm without deformities Or obvious joint restrictions  calf tenderness, cyanosis or clubbing    SKIN: warm and dry without lesions    NEURO:  alert, approp, nl sensorium with  no motor or cerebellar deficits apparent.            Assessment

## 2022-07-11 NOTE — Assessment & Plan Note (Signed)
D/c acei 06/13/2022 due to cough / refractory HB > improved 07/11/2022   Although even in retrospect it may not be clear the ACEi contributed to the pt's symptoms,  Pt improved off them and adding them back at this point or in the future would risk confusion in interpretation of non-specific respiratory symptoms to which this patient is prone  ie  Better not to muddy the waters here.   bp at goal on diovan 80  Mg one daily > no change rec         Each maintenance medication was reviewed in detail including emphasizing most importantly the difference between maintenance and prns and under what circumstances the prns are to be triggered using an action plan format where appropriate.  Total time for H and P, chart review, counseling,   and generating customized AVS unique to this office visit/ translated into arabic by [employer] and reviewed with pt with interpreter to assure accuracy / same day charting = 30 min

## 2022-07-11 NOTE — Patient Instructions (Addendum)
Pantoprazole (protonix) 40 mg   Take  30-60 min before first meal of the day and Pepcid (famotidine)  20 mg after supper until return to office - this is the best way to tell whether stomach acid is contributing to your problem.    GERD (REFLUX)  is an extremely common cause of respiratory symptoms just like yours , many times with no obvious heartburn at all.    It can be treated with medication, but also with lifestyle changes including elevation of the head of your bed (ideally with 6 -8inch blocks under the headboard of your bed),  Smoking cessation, avoidance of late meals, excessive alcohol, and avoid fatty foods, chocolate, peppermint, colas, red wine, and acidic juices such as orange juice.  NO MINT OR MENTHOL PRODUCTS SO NO COUGH DROPS (except Ludens ok)  USE SUGARLESS CANDY INSTEAD (Jolley ranchers or Stover's or Astronomer) or even ice chips will also do - the key is to swallow to prevent all throat clearing. NO OIL BASED VITAMINS - use powdered substitutes.  Avoid fish oil when coughing.   Please schedule a follow up office visit in 6 weeks, sooner if needed

## 2022-07-22 ENCOUNTER — Other Ambulatory Visit: Payer: Self-pay | Admitting: Nurse Practitioner

## 2022-07-22 DIAGNOSIS — E782 Mixed hyperlipidemia: Secondary | ICD-10-CM

## 2022-07-23 ENCOUNTER — Telehealth: Payer: Self-pay

## 2022-07-23 NOTE — Telephone Encounter (Signed)
Cholesterol refill 

## 2022-07-25 ENCOUNTER — Other Ambulatory Visit: Payer: Self-pay | Admitting: Nurse Practitioner

## 2022-07-25 ENCOUNTER — Other Ambulatory Visit: Payer: Self-pay

## 2022-07-25 DIAGNOSIS — E782 Mixed hyperlipidemia: Secondary | ICD-10-CM

## 2022-07-25 MED ORDER — ATORVASTATIN CALCIUM 10 MG PO TABS
10.0000 mg | ORAL_TABLET | Freq: Every day | ORAL | 11 refills | Status: DC
  Filled 2022-07-25: qty 30, 30d supply, fill #0

## 2022-08-22 ENCOUNTER — Ambulatory Visit: Payer: Medicaid Other | Admitting: Internal Medicine

## 2022-08-22 NOTE — Progress Notes (Deleted)
Manuel Brooks, male    DOB: [birth year]   MRN: [id number]   Brief patient profile:  61   yo [name] quit smoking 2001  in [place] since  2000 last international travel 2019  referred to pulmonary clinic 06/13/2022 by Manuel Seller NP  for cough since covid   April 2021       History of Present Illness  06/13/2022  Pulmonary/ 1st office eval/Manuel Brooks  Chief Complaint  Patient presents with   Consult    Clearing throat after talking, burning in chest  Dyspnea:  only with fast walking  Cough: talking > severe cough to point of gagging / dry with overt HB as well  Sleep: not coughing hs / bed flat 2 pillows  SABA use: not using - does not help  Rec Stop lisinopril and start valsartan 80 mg one daily and your cough should gradually improve  Protonix 40 mg Take 30-60 min before first meal of the day and pepcid 20 mg after supper until return  GERD  diet  Please schedule a follow up office visit in 4 weeks, call sooner if needed with all medications / over the counter in hand      07/11/2022  f/u ov/Manuel Brooks re: cough since April 2021  maint on pepcid 20 mg p supper  and "asthma" med as needed  Chief Complaint  Patient presents with   Follow-up    Cough-better until 2 wks., cough-clear. Ago and got cold from son  Dyspnea:  walking fine  Cough: on smoke exp , min mucoid,was much better until "got a cold" from son Sleeping: none noct/ no am cough  SABA use: maybe once a week  02: none  Covid status:    vax x 3 / infected  Rec Pantoprazole (protonix) 40 mg   Take  30-60 min before first meal of the day and Pepcid (famotidine)  20 mg after supper until return to office   GERD diet     08/22/2022  f/u ov/Manuel Brooks re: ***   maint on ***  No chief complaint on file.   Dyspnea:  *** Cough: *** Sleeping: *** SABA use: *** 02: *** Covid status:   *** Lung cancer screening :  ***    No obvious day to day or daytime variability or assoc excess/ purulent sputum or mucus plugs or hemoptysis or cp or  chest tightness, subjective wheeze or overt sinus or hb symptoms.   *** without nocturnal  or early am exacerbation  of respiratory  c/o's or need for noct saba. Also denies any obvious fluctuation of symptoms with weather or environmental changes or other aggravating or alleviating factors except as outlined above   No unusual exposure hx or h/o childhood pna/ asthma or knowledge of premature birth.  Current Allergies, Complete Past Medical History, Past Surgical History, Family History, and Social History were reviewed in [employer] record.  ROS  The following are not active complaints unless bolded Hoarseness, sore throat, dysphagia, dental problems, itching, sneezing,  nasal congestion or discharge of excess mucus or purulent secretions, ear ache,   fever, chills, sweats, unintended wt loss or wt gain, classically pleuritic or exertional cp,  orthopnea pnd or arm/hand swelling  or leg swelling, presyncope, palpitations, abdominal pain, anorexia, nausea, vomiting, diarrhea  or change in bowel habits or change in bladder habits, change in stools or change in urine, dysuria, hematuria,  rash, arthralgias, visual complaints, headache, numbness, weakness or ataxia or problems with walking or  coordination,  change in mood or  memory.        No outpatient medications have been marked as taking for the 08/22/22 encounter (Appointment) with Tanda Rockers, MD.               Past Medical History:  Diagnosis Date   Allergy    CAP (community acquired pneumonia) 01/2019   Elevated liver enzymes 10/2019   Exertional shortness of breath 04/2019   GERD (gastroesophageal reflux disease)    H. pylori infection 03/2020   Hyperlipidemia 10/2019   Hypertension    Leucopenia    Vitamin D deficiency 11/2019       Objective:      08/22/2022        ***   07/11/22 176 lb (79.8 kg)  06/13/22 176 lb 12.8 oz (80.2 kg)  04/11/22 175 lb 3.2 oz (79.5 kg)    Vital signs reviewed   08/22/2022  - Note at rest 02 sats  ***% on ***   General appearance:    ***            Assessment

## 2022-08-27 ENCOUNTER — Other Ambulatory Visit: Payer: Self-pay

## 2022-08-27 ENCOUNTER — Telehealth: Payer: Self-pay

## 2022-08-27 DIAGNOSIS — E782 Mixed hyperlipidemia: Secondary | ICD-10-CM

## 2022-08-27 MED ORDER — ATORVASTATIN CALCIUM 10 MG PO TABS
10.0000 mg | ORAL_TABLET | Freq: Every day | ORAL | 1 refills | Status: DC
  Filled 2022-08-27: qty 90, 90d supply, fill #0

## 2022-08-27 NOTE — Telephone Encounter (Signed)
Pt was advised to go pick up. [place]

## 2022-08-27 NOTE — Telephone Encounter (Signed)
Cholesterol refill 

## 2022-08-28 ENCOUNTER — Other Ambulatory Visit: Payer: Self-pay

## 2022-09-07 ENCOUNTER — Other Ambulatory Visit: Payer: Self-pay | Admitting: Nurse Practitioner

## 2022-09-07 ENCOUNTER — Ambulatory Visit (INDEPENDENT_AMBULATORY_CARE_PROVIDER_SITE_OTHER): Payer: Medicaid Other | Admitting: Nurse Practitioner

## 2022-09-07 ENCOUNTER — Encounter: Payer: Self-pay | Admitting: Nurse Practitioner

## 2022-09-07 VITALS — BP 124/82 | HR 84 | Temp 98.1°F | Ht 64.5 in | Wt 180.2 lb

## 2022-09-07 DIAGNOSIS — R053 Chronic cough: Secondary | ICD-10-CM

## 2022-09-07 DIAGNOSIS — K219 Gastro-esophageal reflux disease without esophagitis: Secondary | ICD-10-CM

## 2022-09-07 DIAGNOSIS — Z23 Encounter for immunization: Secondary | ICD-10-CM

## 2022-09-07 MED ORDER — PANTOPRAZOLE SODIUM 40 MG PO TBEC: 40.0000 mg | DELAYED_RELEASE_TABLET | Freq: Two times a day (BID) | ORAL | 1 refills | Status: DC

## 2022-09-07 MED ORDER — CHLORPHENIRAMINE MALEATE 4 MG PO TABS: 4.0000 mg | ORAL_TABLET | Freq: Four times a day (QID) | ORAL | 0 refills | Status: DC | PRN

## 2022-09-07 MED ORDER — FAMOTIDINE 20 MG PO TABS: 20.0000 mg | ORAL_TABLET | Freq: Every day | ORAL | 0 refills | Status: DC

## 2022-09-07 NOTE — Assessment & Plan Note (Addendum)
Improved after discontinuing ACEi and starting PPI. Unfortunately, has stopped PPI as his prescription ran out. His cough seems to be related to poorly controlled GERD. I am going to have him increase his protonix to twice daily dosing for the next 6 weeks then he can return to once daily. He will also restart famotidine prior to bedtime. We again discussed the importance of avoiding reflux triggering foods. He does not report any significant sinus symptoms. Add on chlortab to help with cough/potential postnasal drainage. If no improvement in his cough, we will obtain further workup with PFTs, allergy testing, and FeNO.   Patient Instructions  Increase pantoprazole to twice daily for the next 6 weeks. Take 30 minutes before meals Continue famotidine 20 mg daily - take before bedtime   Chlorpheniramine 4 mg every 6 hours for cough/drainage. May make you drowsy  Follow up in 6 weeks with Dr. Sherene Sires. If symptoms do not improve or worsen, please contact office for sooner follow up or seek emergency care.

## 2022-09-07 NOTE — Patient Instructions (Addendum)
Increase pantoprazole to twice daily for the next 6 weeks. Take 30 minutes before meals Continue famotidine 20 mg daily - take before bedtime   Chlorpheniramine 4 mg every 6 hours for cough/drainage. May make you drowsy  Follow up in 6 weeks with Dr. Sherene Sires. If symptoms do not improve or worsen, please contact office for sooner follow up or seek emergency care.

## 2022-09-07 NOTE — Assessment & Plan Note (Addendum)
Poorly controlled. See above plan. Will consider GI referral should his symptoms persist. Reviewed red flag symptoms.

## 2022-09-07 NOTE — Progress Notes (Signed)
@Patient  ID: , male    DOB: [birth year], 61 y.o.   MRN: [id number]  Chief Complaint  Patient presents with   Follow-up    No new issues since LOV     Referring provider: [id number], NP  HPI: 61 year old male, former smoker followed for chronic cough and DOE.  He was initially referred after developing a persistent cough following COVID infection in April 2021.  He is a patient Dr. May 2021 and last seen in office 07/11/2022.  Past medical history significant for hypertension, GERD, HLD.  TEST/EVENTS:  06/13/2022 CXR 2 view: Both lungs are clear.  07/11/2022: OV with Dr. 07/13/2022.  Initially seen August 2023 for consult due to cough following COVID infection and dyspnea upon exertion.  He was advised to stop lisinopril and start valsartan.  He was also instructed to target GERD symptoms with Protonix daily and Pepcid following dinner.  Cough initially improved but around 2 weeks ago began having cold-like symptoms and coughing more.  Son was also sick with a cold.  Advised to COVID test at home.  Treat URI symptoms with supportive measures.  Continue Protonix as prescribed.  09/07/2022: Today - follow up Patient presents today for follow-up.  He has recovered post URI.  Cough feels like it is back to his baseline.  Still dry and hacky, which has been normal for him for the past few years.  He was started on Protonix and did have some improvement in August.  Unfortunately, he continues to experience reflux symptoms especially after eating certain foods.  He ran out of the Protonix so he is not currently on this or the Pepcid.  Occasionally when he brushes his teeth, he does gag and feels like he might throw up.  No other episodes of nausea or vomiting.  Denies any difficulty swallowing, abdominal pain, voice hoarseness.  He does not have any issues with his breathing.  Feels like he can do all activities without difficulties.  No Known Allergies  Immunization History  Administered Date(s)  Administered   Influenza,inj,Quad PF,6+ Mos 07/22/2014, 08/02/2016, 10/10/2018, 09/12/2020   Tdap 05/20/2018    Past Medical History:  Diagnosis Date   Allergy    CAP (community acquired pneumonia) 01/2019   Elevated liver enzymes 10/2019   Exertional shortness of breath 04/2019   GERD (gastroesophageal reflux disease)    H. pylori infection 03/2020   Hyperlipidemia 10/2019   Hypertension    Leucopenia    Vitamin D deficiency 11/2019    Tobacco History: Social History   Tobacco Use  Smoking Status Former   Packs/day: 0.50   Types: Cigarettes   Start date: 10/22/1988   Quit date: 10/22/1998   Years since quitting: 23.8  Smokeless Tobacco Never   Counseling given: Not Answered   Outpatient Medications Prior to Visit  Medication Sig Dispense Refill   atorvastatin (LIPITOR) 10 MG tablet Take 1 tablet (10 mg total) by mouth daily. 90 tablet 1   fluticasone (FLONASE) 50 MCG/ACT nasal spray Place 2 sprays into both nostrils daily. 9.9 g 11   hydrocortisone (ANUSOL-HC) 2.5 % rectal cream Place 1 application rectally 2 (two) times daily. 30 g 3   hydrocortisone cream (PREPARATION H) 1 % Apply 1 application topically 2 (two) times daily. 30 g 11   valsartan (DIOVAN) 80 MG tablet Take 1 tablet (80 mg total) by mouth daily. 30 tablet 11   famotidine (PEPCID) 20 MG tablet One after supper 30 tablet 11   pantoprazole (PROTONIX) 40  MG tablet Take 1 tablet (40 mg total) by mouth daily. Take 30-60 min before first meal of the day 30 tablet 2   No facility-administered medications prior to visit.     Review of Systems:   Constitutional: No weight loss or gain, night sweats, fevers, chills, fatigue, or lassitude. HEENT: No headaches, difficulty swallowing, tooth/dental problems, or sore throat. No sneezing, itching, ear ache, nasal congestion, or post nasal drip CV:  No chest pain, orthopnea, PND, swelling in lower extremities, anasarca, dizziness, palpitations, syncope Resp: +chronic,  dry hacking cough. No shortness of breath with exertion or at rest. No excess mucus or change in color of mucus. No hemoptysis. No wheezing.  No chest wall deformity GI: +heartburn, indigestion. No abdominal pain, nausea, vomiting, diarrhea, change in bowel habits, loss of appetite, bloody stools.  GU: No dysuria, change in color of urine, urgency or frequency.   Skin: No rash, lesions, ulcerations MSK:  No joint pain or swelling.  No decreased range of motion.  No back pain. Neuro: No dizziness or lightheadedness.  Psych: No depression or anxiety. Mood stable.     Physical Exam:  BP 124/82 (BP Location: Right Arm, Patient Position: Sitting, Cuff Size: Normal)   Pulse 84   Temp 98.1 F (36.7 C) (Oral)   Ht 5' 4.5" (1.638 m)   Wt 180 lb 3.2 oz (81.7 kg)   SpO2 98%   BMI 30.45 kg/m   GEN: Pleasant, interactive, well-appearing; in no acute distress HEENT:  Normocephalic and atraumatic. PERRLA. Sclera white. Nasal turbinates pink, moist and patent bilaterally. No rhinorrhea present. Oropharynx erythematous and moist, without exudate or edema. No lesions, ulcerations NECK:  Supple w/ fair ROM. No JVD present. Normal carotid impulses w/o bruits. Thyroid symmetrical with no goiter or nodules palpated. No lymphadenopathy.   CV: RRR, no m/r/g, no peripheral edema. Pulses intact, +2 bilaterally. No cyanosis, pallor or clubbing. PULMONARY:  Unlabored, regular breathing. Clear bilaterally A&P w/o wheezes/rales/rhonchi. No accessory muscle use.  GI: BS present and normoactive. Soft, non-tender to palpation. No organomegaly or masses detected.  MSK: No erythema, warmth or tenderness. Cap refil <2 sec all extrem. No deformities or joint swelling noted.  Neuro: A/Ox3. No focal deficits noted.   Skin: Warm, no lesions or rashe Psych: Normal affect and behavior. Judgement and thought content appropriate.     Lab Results:  CBC    Component Value Date/Time   WBC 3.1 (L) 04/11/2022 0848   WBC 1.2  (LL) 10/16/2019 0956   RBC 5.61 04/11/2022 0848   RBC 5.54 10/16/2019 0956   HGB 15.2 04/11/2022 0848   HGB 14.9 09/27/2011 1015   HCT 46.1 04/11/2022 0848   HCT 44.6 09/27/2011 1015   PLT 260 04/11/2022 0848   MCV 82 04/11/2022 0848   MCV 82.4 09/27/2011 1015   MCH 27.1 04/11/2022 0848   MCH 26.9 10/16/2019 0956   MCHC 33.0 04/11/2022 0848   MCHC 33.0 10/16/2019 0956   RDW 14.0 04/11/2022 0848   RDW 14.3 09/27/2011 1015   LYMPHSABS 1.6 07/26/2021 1033   LYMPHSABS 1.9 09/27/2011 1015   MONOABS 0.2 10/16/2019 0956   MONOABS 0.4 09/27/2011 1015   EOSABS 0.3 07/26/2021 1033   BASOSABS 0.0 07/26/2021 1033   BASOSABS 0.0 09/27/2011 1015    BMET    Component Value Date/Time   NA 138 04/11/2022 0848   K 4.5 04/11/2022 0848   CL 99 04/11/2022 0848   CO2 25 04/11/2022 0848   GLUCOSE 95 04/11/2022 0848  GLUCOSE 108 (H) 10/16/2019 0956   BUN 12 04/11/2022 0848   CREATININE 1.02 04/11/2022 0848   CREATININE 1.00 10/30/2016 0839   CALCIUM 9.4 04/11/2022 0848   GFRNONAA 91 11/20/2019 1501   GFRNONAA 83 10/30/2016 0839   GFRAA 105 11/20/2019 1501   GFRAA >89 10/30/2016 0839    BNP No results found for: "BNP"   Imaging:  No results found.        No data to display          No results found for: "NITRICOXIDE"      Assessment & Plan:   Cough Improved after discontinuing ACEi and starting PPI. Unfortunately, has stopped PPI as his prescription ran out. His cough seems to be related to poorly controlled GERD. I am going to have him increase his protonix to twice daily dosing for the next 6 weeks then he can return to once daily. He will also restart famotidine prior to bedtime. We again discussed the importance of avoiding reflux triggering foods. He does not report any significant sinus symptoms. Add on chlortab to help with cough/potential postnasal drainage. If no improvement in his cough, we will obtain further workup with PFTs, allergy testing, and FeNO.    Patient Instructions  Increase pantoprazole to twice daily for the next 6 weeks. Take 30 minutes before meals Continue famotidine 20 mg daily - take before bedtime   Chlorpheniramine 4 mg every 6 hours for cough/drainage. May make you drowsy  Follow up in 6 weeks with Dr. Sherene Sires. If symptoms do not improve or worsen, please contact office for sooner follow up or seek emergency care.    Gastroesophageal reflux disease Poorly controlled. See above plan. Will consider GI referral should his symptoms persist. Reviewed red flag symptoms.   I spent 32 minutes of dedicated to the care of this patient on the date of this encounter to include pre-visit review of records, face-to-face time with the patient discussing conditions above, post visit ordering of testing, clinical documentation with the electronic health record, making appropriate referrals as documented, and communicating necessary findings to members of the patients care team.  Noemi Chapel, NP 09/07/2022  Pt aware and understands NP's role.

## 2022-09-07 NOTE — Telephone Encounter (Signed)
Refill was sent today.

## 2022-09-10 ENCOUNTER — Encounter: Payer: Self-pay | Admitting: Nurse Practitioner

## 2022-09-10 ENCOUNTER — Ambulatory Visit (INDEPENDENT_AMBULATORY_CARE_PROVIDER_SITE_OTHER): Payer: Medicaid Other | Admitting: Nurse Practitioner

## 2022-09-10 VITALS — BP 139/96 | HR 73 | Temp 98.2°F | Wt 181.0 lb

## 2022-09-10 DIAGNOSIS — R110 Nausea: Secondary | ICD-10-CM

## 2022-09-10 DIAGNOSIS — K219 Gastro-esophageal reflux disease without esophagitis: Secondary | ICD-10-CM

## 2022-09-10 MED ORDER — OMEPRAZOLE 20 MG PO CPDR: 20.0000 mg | DELAYED_RELEASE_CAPSULE | Freq: Every day | ORAL | 3 refills | Status: DC

## 2022-09-10 MED ORDER — ONDANSETRON HCL 4 MG PO TABS: 4.0000 mg | ORAL_TABLET | Freq: Three times a day (TID) | ORAL | 0 refills | Status: AC | PRN

## 2022-09-10 NOTE — Assessment & Plan Note (Signed)
-   omeprazole (PRILOSEC) 20 MG capsule; Take 1 capsule (20 mg total) by mouth daily.  Dispense: 30 capsule; Refill: 3 - ondansetron (ZOFRAN) 4 MG tablet; Take 1 tablet (4 mg total) by mouth every 8 (eight) hours as needed for nausea or vomiting.  Dispense: 20 tablet; Refill: 0  2. Nausea  - omeprazole (PRILOSEC) 20 MG capsule; Take 1 capsule (20 mg total) by mouth daily.  Dispense: 30 capsule; Refill: 3  Follow up:  Follow up as scheduled

## 2022-09-10 NOTE — Progress Notes (Signed)
@Patient  ID: , male    DOB: December 08, 1960, 61 y.o.   MRN: [id number]  Chief Complaint  Patient presents with   Abdominal Pain    Pt stated-upper stomach pain, burning,  and worse in the morning--4 days    Referring provider: [id number], NP   HPI  Manuel Brooks presents for follow up. He  has a past medical history of Allergy, CAP (community acquired pneumonia) (01/2019), Elevated liver enzymes (10/2019), Exertional shortness of breath (04/2019), GERD (gastroesophageal reflux disease), H. pylori infection (03/2020), Hyperlipidemia (10/2019), Hypertension, Leucopenia, and Vitamin D deficiency (11/2019).    Patient presents today for an acute visit.  He states that for the past few days he has been having abdominal pain in the epigastric region when waking up in the mornings.  He states that he does have throat clearing as well.  We discussed that this could be reflux.  We will trial omeprazole at bedtime and will give him some Zofran for nausea.  He denies any vomiting or diarrhea. Denies f/c/s, n/v/d, hemoptysis, PND, leg swelling Denies chest pain or edema      No Known Allergies  Immunization History  Administered Date(s) Administered   Influenza,inj,Quad PF,6+ Mos 07/22/2014, 08/02/2016, 10/10/2018, 09/12/2020, 09/07/2022   Tdap 05/20/2018    Past Medical History:  Diagnosis Date   Allergy    CAP (community acquired pneumonia) 01/2019   Elevated liver enzymes 10/2019   Exertional shortness of breath 04/2019   GERD (gastroesophageal reflux disease)    H. pylori infection 03/2020   Hyperlipidemia 10/2019   Hypertension    Leucopenia    Vitamin D deficiency 11/2019    Tobacco History: Social History   Tobacco Use  Smoking Status Former   Packs/day: 0.50   Types: Cigarettes   Start date: 10/22/1988   Quit date: 10/22/1998   Years since quitting: 23.9  Smokeless Tobacco Never   Counseling given: Not Answered   Outpatient Encounter Medications as of 09/10/2022   Medication Sig   atorvastatin (LIPITOR) 10 MG tablet Take 1 tablet (10 mg total) by mouth daily.   chlorpheniramine (CHLOR-TRIMETON) 4 MG tablet Take 1 tablet (4 mg total) by mouth every 6 (six) hours as needed (cough).   fluticasone (FLONASE) 50 MCG/ACT nasal spray Place 2 sprays into both nostrils daily.   hydrocortisone (ANUSOL-HC) 2.5 % rectal cream Place 1 application rectally 2 (two) times daily.   hydrocortisone cream (PREPARATION H) 1 % Apply 1 application topically 2 (two) times daily.   omeprazole (PRILOSEC) 20 MG capsule Take 1 capsule (20 mg total) by mouth daily.   ondansetron (ZOFRAN) 4 MG tablet Take 1 tablet (4 mg total) by mouth every 8 (eight) hours as needed for nausea or vomiting.   pantoprazole (PROTONIX) 40 MG tablet TAKE 1 TABLET(40 MG) BY MOUTH TWICE DAILY BEFORE A MEAL   valsartan (DIOVAN) 80 MG tablet Take 1 tablet (80 mg total) by mouth daily.   [DISCONTINUED] famotidine (PEPCID) 20 MG tablet TAKE 1 TABLET(20 MG) BY MOUTH AT BEDTIME (Patient not taking: Reported on 09/10/2022)   No facility-administered encounter medications on file as of 09/10/2022.     Review of Systems  Review of Systems  Constitutional: Negative.   HENT: Negative.    Cardiovascular: Negative.   Gastrointestinal: Negative.   Allergic/Immunologic: Negative.   Neurological: Negative.   Psychiatric/Behavioral: Negative.         Physical Exam  BP (!) 139/96   Pulse 73   Temp 98.2 F (36.8  C)   Wt 181 lb (82.1 kg)   SpO2 96%   BMI 30.59 kg/m   Wt Readings from Last 5 Encounters:  09/10/22 181 lb (82.1 kg)  09/07/22 180 lb 3.2 oz (81.7 kg)  07/11/22 176 lb (79.8 kg)  06/13/22 176 lb 12.8 oz (80.2 kg)  04/11/22 175 lb 3.2 oz (79.5 kg)     Physical Exam Vitals and nursing note reviewed.  Constitutional:      General: He is not in acute distress.    Appearance: He is well-developed.  Cardiovascular:     Rate and Rhythm: Normal rate and regular rhythm.  Pulmonary:      Effort: Pulmonary effort is normal.     Breath sounds: Normal breath sounds.  Skin:    General: Skin is warm and dry.  Neurological:     Mental Status: He is alert and oriented to person, place, and time.       Assessment & Plan:   Gastroesophageal reflux disease - omeprazole (PRILOSEC) 20 MG capsule; Take 1 capsule (20 mg total) by mouth daily.  Dispense: 30 capsule; Refill: 3 - ondansetron (ZOFRAN) 4 MG tablet; Take 1 tablet (4 mg total) by mouth every 8 (eight) hours as needed for nausea or vomiting.  Dispense: 20 tablet; Refill: 0  2. Nausea  - omeprazole (PRILOSEC) 20 MG capsule; Take 1 capsule (20 mg total) by mouth daily.  Dispense: 30 capsule; Refill: 3  Follow up:  Follow up as scheduled     Fenton Foy, NP 09/10/2022

## 2022-09-10 NOTE — Patient Instructions (Signed)
1. Gastroesophageal reflux disease without esophagitis  - omeprazole (PRILOSEC) 20 MG capsule; Take 1 capsule (20 mg total) by mouth daily.  Dispense: 30 capsule; Refill: 3 - ondansetron (ZOFRAN) 4 MG tablet; Take 1 tablet (4 mg total) by mouth every 8 (eight) hours as needed for nausea or vomiting.  Dispense: 20 tablet; Refill: 0  2. Nausea  - omeprazole (PRILOSEC) 20 MG capsule; Take 1 capsule (20 mg total) by mouth daily.  Dispense: 30 capsule; Refill: 3  Follow up:  Follow up as scheduled

## 2022-10-28 NOTE — Progress Notes (Deleted)
Manuel Brooks, male    DOB: 04/14/1961   MRN: [id number]   Brief patient profile:  62  yo  [name] quit smoking 2001  in [place] since  2000 last international travel 2019  referred to pulmonary clinic 06/13/2022 by Manuel Seller NP  for cough since covid   April 2021       History of Present Illness  06/13/2022  Pulmonary/ 1st office eval/Manuel Brooks  Chief Complaint  Patient presents with   Consult    Clearing throat after talking, burning in chest  Dyspnea:  only with fast walking  Cough: talking > severe cough to point of gagging / dry with overt HB as well  Sleep: not coughing hs / bed flat 2 pillows  SABA use: not using - does not help  Rec Stop lisinopril and start valsartan 80 mg one daily and your cough should gradually improve  Protonix 40 mg Take 30-60 min before first meal of the day and pepcid 20 mg after supper until return  GERD  diet  Please schedule a follow up office visit in 4 weeks, call sooner if needed with all medications / over the counter in hand      07/11/2022  f/u ov/Manuel Brooks re: cough since April 2021    maint on pepcid 20 mg p supper  and "asthma" med as needed  Chief Complaint  Patient presents with   Follow-up    Cough-better until 2 wks., cough-clear. Ago and got cold from son  Dyspnea:  walking fine  Cough: on smoke exp , min mucoid,was much better until "got a cold" from son Sleeping: none noct/ no am cough  SABA use: maybe once a week  02: none  Covid status:    vax x 3 / infected  Rec Please remember to go to the  x-ray department : nl  Stop lisinopril and start valsartan 80 mg one daily and your cough should gradually improve  Protonix 40 mg Take 30-60 min before first meal of the day and pepcid 20 mg after supper until return  GERD diet reviewed, bed blocks rec  Please schedule a follow up office visit in 4 weeks, call sooner if needed with all medications / over the counter in hand     10/29/2022  f/u ov/Manuel Brooks re: ***   maint on ***  No chief  complaint on file.   Dyspnea:  *** Cough: *** Sleeping: *** SABA use: *** 02: *** Covid status:   *** Lung cancer screening :  ***    No obvious day to day or daytime variability or assoc excess/ purulent sputum or mucus plugs or hemoptysis or cp or chest tightness, subjective wheeze or overt sinus or hb symptoms.   *** without nocturnal  or early am exacerbation  of respiratory  c/o's or need for noct saba. Also denies any obvious fluctuation of symptoms with weather or environmental changes or other aggravating or alleviating factors except as outlined above   No unusual exposure hx or h/o childhood pna/ asthma or knowledge of premature birth.  Current Allergies, Complete Past Medical History, Past Surgical History, Family History, and Social History were reviewed in [employer] record.  ROS  The following are not active complaints unless bolded Hoarseness, sore throat, dysphagia, dental problems, itching, sneezing,  nasal congestion or discharge of excess mucus or purulent secretions, ear ache,   fever, chills, sweats, unintended wt loss or wt gain, classically pleuritic or exertional cp,  orthopnea pnd or  arm/hand swelling  or leg swelling, presyncope, palpitations, abdominal pain, anorexia, nausea, vomiting, diarrhea  or change in bowel habits or change in bladder habits, change in stools or change in urine, dysuria, hematuria,  rash, arthralgias, visual complaints, headache, numbness, weakness or ataxia or problems with walking or coordination,  change in mood or  memory.        No outpatient medications have been marked as taking for the 10/29/22 encounter (Appointment) with Manuel Rockers, MD.             Past Medical History:  Diagnosis Date   Allergy    CAP (community acquired pneumonia) 01/2019   Elevated liver enzymes 10/2019   Exertional shortness of breath 04/2019   GERD (gastroesophageal reflux disease)    H. pylori infection 03/2020    Hyperlipidemia 10/2019   Hypertension    Leucopenia    Vitamin D deficiency 11/2019       Objective:     10/29/2022          ***  07/11/22 176 lb (79.8 kg)  06/13/22 176 lb 12.8 oz (80.2 kg)  04/11/22 175 lb 3.2 oz (79.5 kg)     Vital signs reviewed  10/29/2022  - Note at rest 02 sats  ***% on ***   General appearance:    ***                Assessment

## 2022-10-29 ENCOUNTER — Ambulatory Visit: Payer: Medicaid Other | Admitting: Internal Medicine

## 2022-11-12 ENCOUNTER — Ambulatory Visit (INDEPENDENT_AMBULATORY_CARE_PROVIDER_SITE_OTHER): Payer: Medicaid Other | Admitting: Internal Medicine

## 2022-11-12 ENCOUNTER — Ambulatory Visit: Payer: Medicaid Other | Admitting: Nurse Practitioner

## 2022-11-12 ENCOUNTER — Encounter: Payer: Self-pay | Admitting: Nurse Practitioner

## 2022-11-12 ENCOUNTER — Encounter: Payer: Self-pay | Admitting: Internal Medicine

## 2022-11-12 VITALS — BP 115/75 | HR 81 | Temp 97.7°F | Wt 184.4 lb

## 2022-11-12 VITALS — BP 132/84 | HR 76 | Temp 98.1°F | Ht 65.0 in | Wt 184.2 lb

## 2022-11-12 DIAGNOSIS — R053 Chronic cough: Secondary | ICD-10-CM | POA: Diagnosis not present

## 2022-11-12 DIAGNOSIS — Z1211 Encounter for screening for malignant neoplasm of colon: Secondary | ICD-10-CM | POA: Diagnosis not present

## 2022-11-12 DIAGNOSIS — K219 Gastro-esophageal reflux disease without esophagitis: Secondary | ICD-10-CM

## 2022-11-12 DIAGNOSIS — I10 Essential (primary) hypertension: Secondary | ICD-10-CM

## 2022-11-12 DIAGNOSIS — K649 Unspecified hemorrhoids: Secondary | ICD-10-CM | POA: Diagnosis not present

## 2022-11-12 MED ORDER — OMEPRAZOLE 20 MG PO CPDR: 20.0000 mg | DELAYED_RELEASE_CAPSULE | Freq: Every day | ORAL | 0 refills | Status: DC

## 2022-11-12 MED ORDER — PANTOPRAZOLE SODIUM 40 MG PO TBEC: 40.0000 mg | DELAYED_RELEASE_TABLET | Freq: Every day | ORAL | 0 refills | Status: DC

## 2022-11-12 MED ORDER — HYDROCORTISONE (PERIANAL) 2.5 % EX CREA: 1.0000 | TOPICAL_CREAM | Freq: Two times a day (BID) | CUTANEOUS | 0 refills | Status: DC

## 2022-11-12 NOTE — Patient Instructions (Signed)
   Gastroesophageal reflux disease without esophagitis  - omeprazole (PRILOSEC) 20 MG capsule; Take 1 capsule (20 mg total) by mouth at bedtime.  Dispense: 60 capsule; Refill: 0 - pantoprazole (PROTONIX) 40 MG tablet; Take 1 tablet (40 mg total) by mouth daily.  Dispense: 90 tablet; Refill: 0     Hemorrhoids, unspecified hemorrhoid type  - hydrocortisone (ANUSOL-HC) 2.5 % rectal cream; Place 1 Application rectally 2 (two) times daily.  Dispense: 30 g; Refill: 0   It is important that you exercise regularly at least 30 minutes 5 times a week as tolerated  Think about what you will eat, plan ahead. Choose " clean, green, fresh or frozen" over canned, processed or packaged foods which are more sugary, salty and fatty. 70 to 75% of food eaten should be vegetables and fruit. Three meals at set times with snacks allowed between meals, but they must be fruit or vegetables. Aim to eat over a 12 hour period , example 7 am to 7 pm, and STOP after  your last meal of the day. Drink water,generally about 64 ounces per day, no other drink is as healthy. Fruit juice is best enjoyed in a healthy way, by EATING the fruit.  Thanks for choosing [facility] we consider it a privelige to serve you.

## 2022-11-12 NOTE — Progress Notes (Deleted)
   Acute Office Visit  Subjective:     Patient ID: Manuel Brooks, male    DOB: [birth year], 62 y.o.   MRN: [id number]  Chief Complaint  Patient presents with   Abdominal Pain    For one month    HPI Patient is in today for follw up for epigastric abdomnial pain. Getting better. Worse when he eats late at bedtime  ROS      Objective:    BP 115/75   Pulse 81   Temp 97.7 F (36.5 C)   Wt 184 lb 6.4 oz (83.6 kg)   SpO2 100%   BMI 30.69 kg/m  {Vitals History (Optional):23777}  Physical Exam  No results found for any visits on 11/12/22.      Assessment & Plan:   Problem List Items Addressed This Visit   None   No orders of the defined types were placed in this encounter.   No follow-ups on file.  Renee Rival, FNP

## 2022-11-12 NOTE — Progress Notes (Signed)
Established Patient Office Visit  Subjective:  Patient ID: Manuel Brooks, male    DOB: August 15, 1961  Age: 62 y.o. MRN: [id number]  CC:  Chief Complaint  Patient presents with   Abdominal Pain    For one month    HPI Manuel Brooks is a 62 y.o. male with past medical history of GERD, H. pylori infection, elevated liver enzymes hypertension, leukocytopenia, hyperlipidemia presents for follow-up for GERD.  GERD.  Has  Protonix 40 mg twice daily and omeprazole 20 mg at nighttime ordered.  Patient not sure if he has been taking the medications as ordered but reports improvement in his epigastric abdominal pain.  His abdominal pain is worse if he eats late in the night. patient denies nausea, vomiting bloody stool.  He would also like refill of hemorrhoid cream for hemorrhoids, he is due for colon cancer screening referral placed to GI for colonoscopy and to follow-up for his GERD. Interpretation services provided via [facility] health iPad.  Past Medical History:  Diagnosis Date   Allergy    CAP (community acquired pneumonia) 01/2019   Elevated liver enzymes 10/2019   Exertional shortness of breath 04/2019   GERD (gastroesophageal reflux disease)    H. pylori infection 03/2020   Hyperlipidemia 10/2019   Hypertension    Leucopenia    Vitamin D deficiency 11/2019    Past Surgical History:  Procedure Laterality Date   BIOPSY BONE MARROW  09/21/2011       OTHER SURGICAL HISTORY     was unable to have childern and had surgery so he could have children 1990    Family History  Family history unknown: Yes    Social History   Socioeconomic History   Marital status: Married    Spouse name: Not on file   Number of children: Not on file   Years of education: Not on file   Highest education level: Not on file  Occupational History   Not on file  Tobacco Use   Smoking status: Former    Packs/day: 0.50    Types: Cigarettes    Start date: 10/22/1988    Quit date: 10/22/1998    Years since quitting:  24.0   Smokeless tobacco: Never  Vaping Use   Vaping Use: Never used  Substance and Sexual Activity   Alcohol use: No   Drug use: No    Frequency: 1.0 times per week   Sexual activity: Yes  Other Topics Concern   Not on file  Social History Narrative   Not on file   Social Determinants of Health   Financial Resource Strain: Not on file  Food Insecurity: Not on file  Transportation Needs: Not on file  Physical Activity: Not on file  Stress: Not on file  Social Connections: Not on file  Intimate Partner Violence: Not on file    Outpatient Medications Prior to Visit  Medication Sig Dispense Refill   atorvastatin (LIPITOR) 10 MG tablet Take 1 tablet (10 mg total) by mouth daily. 90 tablet 1   chlorpheniramine (CHLOR-TRIMETON) 4 MG tablet Take 1 tablet (4 mg total) by mouth every 6 (six) hours as needed (cough). 30 tablet 0   fluticasone (FLONASE) 50 MCG/ACT nasal spray Place 2 sprays into both nostrils daily. 9.9 g 11   ondansetron (ZOFRAN) 4 MG tablet Take 1 tablet (4 mg total) by mouth every 8 (eight) hours as needed for nausea or vomiting. 20 tablet 0   valsartan (DIOVAN) 80 MG tablet Take 1 tablet (80  mg total) by mouth daily. 30 tablet 11   omeprazole (PRILOSEC) 20 MG capsule Take 1 capsule (20 mg total) by mouth daily. 30 capsule 3   pantoprazole (PROTONIX) 40 MG tablet TAKE 1 TABLET(40 MG) BY MOUTH TWICE DAILY BEFORE A MEAL 84 tablet 0   hydrocortisone cream (PREPARATION H) 1 % Apply 1 application topically 2 (two) times daily. (Patient not taking: Reported on 11/12/2022) 30 g 11   hydrocortisone (ANUSOL-HC) 2.5 % rectal cream Place 1 application rectally 2 (two) times daily. (Patient not taking: Reported on 11/12/2022) 30 g 3   No facility-administered medications prior to visit.    No Known Allergies  ROS Review of Systems  Constitutional:  Negative for activity change, appetite change, chills, diaphoresis, fatigue and fever.  Respiratory:  Negative for apnea, choking,  chest tightness, shortness of breath, wheezing and stridor.   Cardiovascular:  Negative for chest pain, palpitations and leg swelling.  Gastrointestinal:  Positive for abdominal pain and rectal pain. Negative for blood in stool, constipation, diarrhea and nausea.  Musculoskeletal: Negative.   Neurological:  Negative for dizziness, facial asymmetry, light-headedness, numbness and headaches.  Psychiatric/Behavioral:  Negative for agitation, behavioral problems, confusion, decreased concentration and dysphoric mood.       Objective:    Physical Exam Constitutional:      General: He is not in acute distress.    Appearance: He is obese. He is not ill-appearing, toxic-appearing or diaphoretic.  Cardiovascular:     Rate and Rhythm: Normal rate and regular rhythm.     Heart sounds: Normal heart sounds. No murmur heard.    No friction rub. No gallop.  Pulmonary:     Effort: Pulmonary effort is normal. No respiratory distress.     Breath sounds: Normal breath sounds. No stridor. No wheezing, rhonchi or rales.  Chest:     Chest wall: No tenderness.  Abdominal:     Palpations: Abdomen is soft. There is no shifting dullness, fluid wave, hepatomegaly, splenomegaly, mass or pulsatile mass.     Tenderness: There is abdominal tenderness in the epigastric area. There is no right CVA tenderness, left CVA tenderness, guarding or rebound. Negative signs include Murphy's sign, Rovsing's sign, McBurney's sign and psoas sign.  Skin:    General: Skin is warm and dry.     Capillary Refill: Capillary refill takes less than 2 seconds.     Coloration: Skin is not cyanotic, jaundiced, mottled or pale.     Findings: No erythema.  Neurological:     Mental Status: He is alert and oriented to person, place, and time.     Cranial Nerves: No cranial nerve deficit.     Motor: No weakness.  Psychiatric:        Mood and Affect: Mood normal. Mood is not anxious or depressed.        Behavior: Behavior normal.      BP 115/75   Pulse 81   Temp 97.7 F (36.5 C)   Wt 184 lb 6.4 oz (83.6 kg)   SpO2 100%   BMI 30.69 kg/m  Wt Readings from Last 3 Encounters:  11/12/22 184 lb 6.4 oz (83.6 kg)  11/12/22 184 lb 3.2 oz (83.6 kg)  09/10/22 181 lb (82.1 kg)    Lab Results  Component Value Date   TSH 0.715 11/20/2019   Lab Results  Component Value Date   WBC 3.1 (L) 04/11/2022   HGB 15.2 04/11/2022   HCT 46.1 04/11/2022   MCV 82 04/11/2022  PLT 260 04/11/2022   Lab Results  Component Value Date   NA 138 04/11/2022   K 4.5 04/11/2022   CO2 25 04/11/2022   GLUCOSE 95 04/11/2022   BUN 12 04/11/2022   CREATININE 1.02 04/11/2022   BILITOT 0.3 04/11/2022   ALKPHOS 88 04/11/2022   AST 24 04/11/2022   ALT 17 04/11/2022   PROT 7.1 04/11/2022   ALBUMIN 4.5 04/11/2022   CALCIUM 9.4 04/11/2022   ANIONGAP 12 10/16/2019   EGFR 83 04/11/2022   Lab Results  Component Value Date   CHOL 147 04/11/2022   Lab Results  Component Value Date   HDL 52 04/11/2022   Lab Results  Component Value Date   LDLCALC 80 04/11/2022   Lab Results  Component Value Date   TRIG 77 04/11/2022   Lab Results  Component Value Date   CHOLHDL 2.8 04/11/2022   Lab Results  Component Value Date   HGBA1C 5.6 11/20/2019      Assessment & Plan:   Problem List Items Addressed This Visit       Cardiovascular and Mediastinum   Essential hypertension    BP Readings from Last 3 Encounters:  11/12/22 115/75  11/12/22 132/84  09/10/22 (!) 139/96  Chronic condition currently well-controlled Currently on valsartan 80 mg daily Continue current medication Patient refused labs today Follow-up in 4 months      Hemorrhoids    Uses hydrocortisone 2.5% rectal cream  about twice weekly as needed for hemorrhoids Hydrocortisone 2.5% rectal cream BID ordered Patient encouraged to not use med for longer than one week at a time to prevent risk of mucosal thinning.       Relevant Medications   hydrocortisone  (ANUSOL-HC) 2.5 % rectal cream     Digestive   Gastroesophageal reflux disease     - omeprazole (PRILOSEC) 20 MG capsule; Take 1 capsule (20 mg total) by mouth at bedtime.  Dispense: 60 capsule; Refill: 0 - pantoprazole (PROTONIX) 40 MG tablet; Take 1 tablet (40 mg total) by mouth daily in the morning dispense: 90 tablet; Refill: 0 Patient encouraged to avoid fried food, spicy food, alcohol, caffeinated drinks, avoid laying down to sleep within 2 hours of eating supper Follow-up with GI         Relevant Medications   omeprazole (PRILOSEC) 20 MG capsule   pantoprazole (PROTONIX) 40 MG tablet   Other Visit Diagnoses     Screening for colon cancer    -  Primary   Relevant Orders   Ambulatory referral to Gastroenterology       Meds ordered this encounter  Medications   omeprazole (PRILOSEC) 20 MG capsule    Sig: Take 1 capsule (20 mg total) by mouth at bedtime.    Dispense:  60 capsule    Refill:  0   pantoprazole (PROTONIX) 40 MG tablet    Sig: Take 1 tablet (40 mg total) by mouth daily.    Dispense:  90 tablet    Refill:  0    **Patient requests 90 days supply**   hydrocortisone (ANUSOL-HC) 2.5 % rectal cream    Sig: Place 1 Application rectally 2 (two) times daily.    Dispense:  30 g    Refill:  0    Follow-up: Return in about 4 months (around 03/13/2023) for CPE with Tonya.    Donell Beers, FNP

## 2022-11-12 NOTE — Patient Instructions (Addendum)
No change in your blood pressure medication but please see your primary provider with all medications in hand   Pulmonary follow up is as needed

## 2022-11-12 NOTE — Assessment & Plan Note (Addendum)
Onset p covid Arpil 2021 - d/c ACEi  06/13/2022 and rx gerd ? Adherent>  Improved 07/11/2022 but not taking ppi as rec > add protonix 40 mg  q am ac and f/u in 6 weeks > resolved on f/u 11/12/2022   Upper airway cough syndrome (previously labeled PNDS),  is so named because it's frequently impossible to sort out how much is  CR/sinusitis with freq throat clearing (which can be related to primary GERD)   vs  causing  secondary (" extra esophageal")  GERD from wide swings in gastric pressure that occur with throat clearing, often  promoting self use of mint and menthol lozenges that reduce the lower esophageal sphincter tone and exacerbate the problem further in a cyclical fashion.   These are the same pts (now being labeled as having "irritable larynx syndrome" by some cough centers) who not infrequently have a history of having failed to tolerate ace inhibitors,  dry powder inhalers or biphosphonates or report having atypical/extraesophageal reflux symptoms that don't respond to standard doses of PPI  and are easily confused as having aecopd or asthma flares by even experienced allergists/ pulmonologists (myself included).   Rec continue rx for GERD/ likely will need gi f/u but defer to PCP and continue off acei indefinitely

## 2022-11-12 NOTE — Assessment & Plan Note (Signed)
-   omeprazole (PRILOSEC) 20 MG capsule; Take 1 capsule (20 mg total) by mouth at bedtime.  Dispense: 60 capsule; Refill: 0 - pantoprazole (PROTONIX) 40 MG tablet; Take 1 tablet (40 mg total) by mouth daily in the morning dispense: 90 tablet; Refill: 0 Patient encouraged to avoid fried food, spicy food, alcohol, caffeinated drinks, avoid laying down to sleep within 2 hours of eating supper Follow-up with GI

## 2022-11-12 NOTE — Assessment & Plan Note (Signed)
Uses hydrocortisone 2.5% rectal cream  about twice weekly as needed for hemorrhoids Hydrocortisone 2.5% rectal cream BID ordered Patient encouraged to not use med for longer than one week at a time to prevent risk of mucosal thinning.

## 2022-11-12 NOTE — Assessment & Plan Note (Signed)
BP Readings from Last 3 Encounters:  11/12/22 115/75  11/12/22 132/84  09/10/22 (!) 139/96  Chronic condition currently well-controlled Currently on valsartan 80 mg daily Continue current medication Patient refused labs today Follow-up in 4 months

## 2022-11-12 NOTE — Assessment & Plan Note (Addendum)
D/c acei 06/13/2022 due to cough / refractory HB > improved 07/11/2022   Although even in retrospect it may not always be clear the ACEi contributed to the pt's symptoms,  Pt improved so much off them that adding them back at this point or in the future would risk confusion in interpretation of non-specific respiratory symptoms to which this patient is prone  ie  Better not to muddy the waters here.   >>> f/u per PCP on diovan 80 mg daily (refills thru 05/2023 done)   Each maintenance medication was reviewed in detail including emphasizing most importantly the difference between maintenance and prns and under what circumstances the prns are to be triggered using an action plan format where appropriate.  Total time for H and P, chart review, counseling, reviewing  and generating customized AVS unique to this office visit / same day charting  >  20 min final summary f/u ov

## 2022-11-12 NOTE — Progress Notes (Signed)
Manuel Brooks, male    DOB: [birth year]   MRN: [id number]   Brief patient profile:  62  yo  [place] male quit smoking 2001  in [place] since  2000 last international travel 2019  referred to pulmonary clinic 06/13/2022 by Lazaro Arms NP  for cough since covid   April 2021       History of Present Illness  06/13/2022  Pulmonary/ 1st office eval/Manuel Brooks  Chief Complaint  Patient presents with   Consult    Clearing throat after talking, burning in chest  Dyspnea:  only with fast walking  Cough: talking > severe cough to point of gagging / dry with overt HB as well  Sleep: not coughing hs / bed flat 2 pillows  SABA use: not using - does not help  Rec Stop lisinopril and start valsartan 80 mg one daily and your cough should gradually improve  Protonix 40 mg Take 30-60 min before first meal of the day and pepcid 20 mg after supper until return  GERD  diet  Please schedule a follow up office visit in 4 weeks, call sooner if needed with all medications / over the counter in hand      07/11/2022  f/u ov/Manuel Brooks re: cough since April 2021    maint on pepcid 20 mg p supper  and "asthma" med as needed  Chief Complaint  Patient presents with   Follow-up    Cough-better until 2 wks., cough-clear. Ago and got cold from son  Dyspnea:  walking fine  Cough: on smoke exp , min mucoid,was much better until "got a cold" from son Sleeping: none noct/ no am cough  SABA use: maybe once a week  02: none  Covid status:    vax x 3 / infected  Rec Stop lisinopril and start valsartan 80 mg one daily and your cough should gradually improve  Protonix 40 mg Take 30-60 min before first meal of the day and pepcid 20 mg after supper until return  GERD diet reviewed, bed blocks rec  Please schedule a follow up office visit in 4 weeks, call sooner if needed with all medications / over the counter in hand     11/12/2022  f/u ov/Manuel Brooks re: cough   maint on off acei / did not bring any meds as requested  Chief Complaint  Patient  presents with   Follow-up    GERD worse.  Dyspnea:  denies  Cough: none now  Sleeping: flat bed, several pillows SABA use: none now  02: nonen  Overt HB and confused with what meds he takes and when     No obvious day to day or daytime variability or assoc excess/ purulent sputum or mucus plugs or hemoptysis or cp or chest tightness, subjective wheeze or overt sinus   symptoms.   Sleeping  without nocturnal  or early am exacerbation  of respiratory  c/o's or need for noct saba. Also denies any obvious fluctuation of symptoms with weather or environmental changes or other aggravating or alleviating factors except as outlined above   No unusual exposure hx or h/o childhood pna/ asthma or knowledge of premature birth.  Current Allergies, Complete Past Medical History, Past Surgical History, Family History, and Social History were reviewed in [employer] record.  ROS  The following are not active complaints unless bolded Hoarseness, sore throat, dysphagia, dental problems, itching, sneezing,  nasal congestion or discharge of excess mucus or purulent secretions, ear ache,   fever, chills,  sweats, unintended wt loss or wt gain, classically pleuritic or exertional cp,  orthopnea pnd or arm/hand swelling  or leg swelling, presyncope, palpitations, abdominal pain, anorexia, nausea, vomiting, diarrhea  or change in bowel habits or change in bladder habits, change in stools or change in urine, dysuria, hematuria,  rash, arthralgias, visual complaints, headache, numbness, weakness or ataxia or problems with walking or coordination,  change in mood or  memory.        Current Meds-   - NOTE:   Unable to verify as accurately reflecting what pt takes    Medication Sig   atorvastatin (LIPITOR) 10 MG tablet Take 1 tablet (10 mg total) by mouth daily.   chlorpheniramine (CHLOR-TRIMETON) 4 MG tablet Take 1 tablet (4 mg total) by mouth every 6 (six) hours as needed (cough).    fluticasone (FLONASE) 50 MCG/ACT nasal spray Place 2 sprays into both nostrils daily.   hydrocortisone (ANUSOL-HC) 2.5 % rectal cream Place 1 application rectally 2 (two) times daily.   hydrocortisone cream (PREPARATION H) 1 % Apply 1 application topically 2 (two) times daily.   omeprazole (PRILOSEC) 20 MG capsule Take 1 capsule (20 mg total) by mouth daily.   ondansetron (ZOFRAN) 4 MG tablet Take 1 tablet (4 mg total) by mouth every 8 (eight) hours as needed for nausea or vomiting.   pantoprazole (PROTONIX) 40 MG tablet TAKE 1 TABLET(40 MG) BY MOUTH TWICE DAILY BEFORE A MEAL   valsartan (DIOVAN) 80 MG tablet Take 1 tablet (80 mg total) by mouth daily.             Past Medical History:  Diagnosis Date   Allergy    CAP (community acquired pneumonia) 01/2019   Elevated liver enzymes 10/2019   Exertional shortness of breath 04/2019   GERD (gastroesophageal reflux disease)    H. pylori infection 03/2020   Hyperlipidemia 10/2019   Hypertension    Leucopenia    Vitamin D deficiency 11/2019       Objective:     11/12/2022        184  07/11/22 176 lb (79.8 kg)  06/13/22 176 lb 12.8 oz (80.2 kg)  04/11/22 175 lb 3.2 oz (79.5 kg)     Vital signs reviewed  11/12/2022  - Note at rest 02 sats  99% on RA   General appearance:    amb arabic male nad   HEENT : Oropharynx  clear      Nasal turbinates nl    NECK :  without  apparent JVD/ palpable Nodes/TM    LUNGS: no acc muscle use,  Nl contour chest which is clear to A and P bilaterally without cough on insp or exp maneuvers   CV:  RRR  no s3 or murmur or increase in P2, and no edema   ABD:  soft and nontender with nl inspiratory excursion in the supine position. No bruits or organomegaly appreciated   MS:  Nl gait/ ext warm without deformities Or obvious joint restrictions  calf tenderness, cyanosis or clubbing    SKIN: warm and dry without lesions    NEURO:  alert, approp, nl sensorium with  no motor or cerebellar deficits  apparent.                 Assessment

## 2022-11-19 ENCOUNTER — Ambulatory Visit: Payer: Medicaid Other | Admitting: Nurse Practitioner

## 2022-11-28 ENCOUNTER — Ambulatory Visit (INDEPENDENT_AMBULATORY_CARE_PROVIDER_SITE_OTHER): Payer: Medicaid Other | Admitting: Nurse Practitioner

## 2022-11-28 ENCOUNTER — Encounter: Payer: Self-pay | Admitting: Nurse Practitioner

## 2022-11-28 VITALS — BP 137/89 | HR 67 | Temp 98.1°F | Ht 63.0 in | Wt 181.4 lb

## 2022-11-28 DIAGNOSIS — E782 Mixed hyperlipidemia: Secondary | ICD-10-CM

## 2022-11-28 DIAGNOSIS — Z1211 Encounter for screening for malignant neoplasm of colon: Secondary | ICD-10-CM

## 2022-11-28 DIAGNOSIS — Z125 Encounter for screening for malignant neoplasm of prostate: Secondary | ICD-10-CM | POA: Diagnosis not present

## 2022-11-28 DIAGNOSIS — R32 Unspecified urinary incontinence: Secondary | ICD-10-CM

## 2022-11-28 MED ORDER — ATORVASTATIN CALCIUM 10 MG PO TABS: 10.0000 mg | ORAL_TABLET | Freq: Every day | ORAL | 1 refills | Status: DC

## 2022-11-28 MED ORDER — TAMSULOSIN HCL 0.4 MG PO CAPS: 0.8000 mg | ORAL_CAPSULE | Freq: Every day | ORAL | 2 refills | Status: DC

## 2022-11-28 NOTE — Assessment & Plan Note (Signed)
-   Ambulatory referral to Gastroenterology  2. Mixed hyperlipidemia  - atorvastatin (LIPITOR) 10 MG tablet; Take 1 tablet (10 mg total) by mouth daily.  Dispense: 90 tablet; Refill: 1 - CBC - Comprehensive metabolic panel  3. Prostate cancer screening  - PSA  Follow up:  Follow up in 6 months or sooner if needed

## 2022-11-28 NOTE — Patient Instructions (Signed)
1. Colon cancer screening  - Ambulatory referral to Gastroenterology  2. Mixed hyperlipidemia  - atorvastatin (LIPITOR) 10 MG tablet; Take 1 tablet (10 mg total) by mouth daily.  Dispense: 90 tablet; Refill: 1 - CBC - Comprehensive metabolic panel  3. Prostate cancer screening  - PSA  Follow up:  Follow up in 6 months or sooner if needed

## 2022-11-28 NOTE — Progress Notes (Signed)
@Patient  ID: Manuel Brooks, male    DOB: [birth year], 62 y.o.   MRN: [id number]  Chief Complaint  Patient presents with   prostate     Was on medications but now is out    Referring provider: Fenton Foy, NP   HPI  Patient presents today for follow-up.  He states that he has been out of his Flomax.  We will refill this today.  Patient is also out of cholesterol medication.  Patient is requesting a referral to urology for urinary incontinence. Denies f/c/s, n/v/d, hemoptysis, PND, leg swelling Denies chest pain or edema      No Known Allergies  Immunization History  Administered Date(s) Administered   Influenza,inj,Quad PF,6+ Mos 07/22/2014, 08/02/2016, 10/10/2018, 09/12/2020, 09/07/2022   Tdap 05/20/2018    Past Medical History:  Diagnosis Date   Allergy    CAP (community acquired pneumonia) 01/2019   Elevated liver enzymes 10/2019   Exertional shortness of breath 04/2019   GERD (gastroesophageal reflux disease)    H. pylori infection 03/2020   Hyperlipidemia 10/2019   Hypertension    Leucopenia    Vitamin D deficiency 11/2019    Tobacco History: Social History   Tobacco Use  Smoking Status Former   Packs/day: 0.50   Types: Cigarettes   Start date: 10/22/1988   Quit date: 10/22/1998   Years since quitting: 24.1  Smokeless Tobacco Never   Counseling given: Not Answered   Outpatient Encounter Medications as of 11/28/2022  Medication Sig   chlorpheniramine (CHLOR-TRIMETON) 4 MG tablet Take 1 tablet (4 mg total) by mouth every 6 (six) hours as needed (cough).   fluticasone (FLONASE) 50 MCG/ACT nasal spray Place 2 sprays into both nostrils daily.   hydrocortisone (ANUSOL-HC) 2.5 % rectal cream Place 1 Application rectally 2 (two) times daily.   pantoprazole (PROTONIX) 40 MG tablet Take 1 tablet (40 mg total) by mouth daily.   tamsulosin (FLOMAX) 0.4 MG CAPS capsule Take 2 capsules (0.8 mg total) by mouth daily.   [DISCONTINUED] atorvastatin (LIPITOR) 10 MG tablet  Take 1 tablet (10 mg total) by mouth daily.   atorvastatin (LIPITOR) 10 MG tablet Take 1 tablet (10 mg total) by mouth daily.   hydrocortisone cream (PREPARATION H) 1 % Apply 1 application topically 2 (two) times daily. (Patient not taking: Reported on 11/12/2022)   omeprazole (PRILOSEC) 20 MG capsule Take 1 capsule (20 mg total) by mouth at bedtime. (Patient not taking: Reported on 11/28/2022)   ondansetron (ZOFRAN) 4 MG tablet Take 1 tablet (4 mg total) by mouth every 8 (eight) hours as needed for nausea or vomiting. (Patient not taking: Reported on 11/28/2022)   valsartan (DIOVAN) 80 MG tablet Take 1 tablet (80 mg total) by mouth daily.   No facility-administered encounter medications on file as of 11/28/2022.     Review of Systems  Review of Systems  Constitutional: Negative.   HENT: Negative.    Cardiovascular: Negative.   Gastrointestinal: Negative.   Genitourinary:  Positive for difficulty urinating.  Allergic/Immunologic: Negative.   Neurological: Negative.   Psychiatric/Behavioral: Negative.         Physical Exam  BP 137/89   Pulse 67   Temp 98.1 F (36.7 C)   Ht 5\' 3"  (1.6 m)   Wt 181 lb 6.4 oz (82.3 kg)   SpO2 100%   BMI 32.13 kg/m   Wt Readings from Last 5 Encounters:  11/28/22 181 lb 6.4 oz (82.3 kg)  11/12/22 184 lb 6.4 oz (83.6 kg)  11/12/22 184 lb 3.2 oz (83.6 kg)  09/10/22 181 lb (82.1 kg)  09/07/22 180 lb 3.2 oz (81.7 kg)     Physical Exam Vitals and nursing note reviewed.  Constitutional:      General: He is not in acute distress.    Appearance: He is well-developed.  Cardiovascular:     Rate and Rhythm: Normal rate and regular rhythm.  Pulmonary:     Effort: Pulmonary effort is normal.     Breath sounds: Normal breath sounds.  Skin:    General: Skin is warm and dry.  Neurological:     Mental Status: He is alert and oriented to person, place, and time.      Lab Results:  CBC    Component Value Date/Time   WBC 3.1 (L) 04/11/2022 0848    WBC 1.2 (LL) 10/16/2019 0956   RBC 5.61 04/11/2022 0848   RBC 5.54 10/16/2019 0956   HGB 15.2 04/11/2022 0848   HGB 14.9 09/27/2011 1015   HCT 46.1 04/11/2022 0848   HCT 44.6 09/27/2011 1015   PLT 260 04/11/2022 0848   MCV 82 04/11/2022 0848   MCV 82.4 09/27/2011 1015   MCH 27.1 04/11/2022 0848   MCH 26.9 10/16/2019 0956   MCHC 33.0 04/11/2022 0848   MCHC 33.0 10/16/2019 0956   RDW 14.0 04/11/2022 0848   RDW 14.3 09/27/2011 1015   LYMPHSABS 1.6 07/26/2021 1033   LYMPHSABS 1.9 09/27/2011 1015   MONOABS 0.2 10/16/2019 0956   MONOABS 0.4 09/27/2011 1015   EOSABS 0.3 07/26/2021 1033   BASOSABS 0.0 07/26/2021 1033   BASOSABS 0.0 09/27/2011 1015    BMET    Component Value Date/Time   NA 138 04/11/2022 0848   K 4.5 04/11/2022 0848   CL 99 04/11/2022 0848   CO2 25 04/11/2022 0848   GLUCOSE 95 04/11/2022 0848   GLUCOSE 108 (H) 10/16/2019 0956   BUN 12 04/11/2022 0848   CREATININE 1.02 04/11/2022 0848   CREATININE 1.00 10/30/2016 0839   CALCIUM 9.4 04/11/2022 0848   GFRNONAA 91 11/20/2019 1501   GFRNONAA 83 10/30/2016 0839   GFRAA 105 11/20/2019 1501   GFRAA >89 10/30/2016 0839    BNP No results found for: "BNP"  ProBNP No results found for: "PROBNP"  Imaging: No results found.   Assessment & Plan:   Colon cancer screening - Ambulatory referral to Gastroenterology  2. Mixed hyperlipidemia  - atorvastatin (LIPITOR) 10 MG tablet; Take 1 tablet (10 mg total) by mouth daily.  Dispense: 90 tablet; Refill: 1 - CBC - Comprehensive metabolic panel  3. Prostate cancer screening  - PSA  Follow up:  Follow up in 6 months or sooner if needed     Fenton Foy, NP 11/28/2022

## 2022-11-29 LAB — COMPREHENSIVE METABOLIC PANEL
ALT: 20 IU/L (ref 0–44)
AST: 24 IU/L (ref 0–40)
Albumin/Globulin Ratio: 1.5 (ref 1.2–2.2)
Albumin: 4.3 g/dL (ref 3.9–4.9)
Alkaline Phosphatase: 81 IU/L (ref 44–121)
BUN/Creatinine Ratio: 10 (ref 10–24)
BUN: 10 mg/dL (ref 8–27)
Bilirubin Total: 0.4 mg/dL (ref 0.0–1.2)
CO2: 24 mmol/L (ref 20–29)
Calcium: 9.4 mg/dL (ref 8.6–10.2)
Chloride: 101 mmol/L (ref 96–106)
Creatinine, Ser: 1 mg/dL (ref 0.76–1.27)
Globulin, Total: 2.8 g/dL (ref 1.5–4.5)
Glucose: 88 mg/dL (ref 70–99)
Potassium: 4 mmol/L (ref 3.5–5.2)
Sodium: 139 mmol/L (ref 134–144)
Total Protein: 7.1 g/dL (ref 6.0–8.5)
eGFR: 84 mL/min/{1.73_m2} (ref >59)

## 2022-11-29 LAB — CBC
Hematocrit: 44.4 % (ref 37.5–51.0)
Hemoglobin: 14.4 g/dL (ref 13.0–17.7)
MCH: 27.2 pg (ref 26.6–33.0)
MCHC: 32.4 g/dL (ref 31.5–35.7)
MCV: 84 fL (ref 79–97)
Platelets: 237 10*3/uL (ref 150–450)
RBC: 5.29 x10E6/uL (ref 4.14–5.80)
RDW: 13.7 % (ref 11.6–15.4)
WBC: 3.4 10*3/uL (ref 3.4–10.8)

## 2022-11-29 LAB — PSA: Prostate Specific Ag, Serum: 1.1 ng/mL (ref 0.0–4.0)

## 2022-12-28 ENCOUNTER — Other Ambulatory Visit: Payer: Self-pay

## 2022-12-28 ENCOUNTER — Other Ambulatory Visit: Payer: Self-pay | Admitting: Nurse Practitioner

## 2022-12-28 ENCOUNTER — Encounter: Payer: Self-pay | Admitting: Nurse Practitioner

## 2022-12-28 ENCOUNTER — Ambulatory Visit: Payer: Medicaid Other | Admitting: Nurse Practitioner

## 2022-12-28 VITALS — BP 139/82 | HR 70 | Temp 97.9°F | Wt 180.0 lb

## 2022-12-28 DIAGNOSIS — R39198 Other difficulties with micturition: Secondary | ICD-10-CM

## 2022-12-28 DIAGNOSIS — J302 Other seasonal allergic rhinitis: Secondary | ICD-10-CM

## 2022-12-28 DIAGNOSIS — K219 Gastro-esophageal reflux disease without esophagitis: Secondary | ICD-10-CM | POA: Diagnosis not present

## 2022-12-28 DIAGNOSIS — K649 Unspecified hemorrhoids: Secondary | ICD-10-CM

## 2022-12-28 MED ORDER — MONTELUKAST SODIUM 10 MG PO TABS: 10.0000 mg | ORAL_TABLET | Freq: Every day | ORAL | 3 refills | Status: DC

## 2022-12-28 MED ORDER — HYDROCORTISONE 1 % EX CREA: 1.0000 | TOPICAL_CREAM | Freq: Two times a day (BID) | CUTANEOUS | 11 refills | Status: DC

## 2022-12-28 MED ORDER — HYDROCORTISONE 1 % EX CREA
1.0000 | TOPICAL_CREAM | Freq: Two times a day (BID) | CUTANEOUS | 11 refills | Status: DC
  Filled 2022-12-28: qty 30, 10d supply, fill #0

## 2022-12-28 MED ORDER — FAMOTIDINE 40 MG PO TABS: 40.0000 mg | ORAL_TABLET | Freq: Every day | ORAL | 2 refills | Status: AC

## 2022-12-28 NOTE — Assessment & Plan Note (Signed)
-   Ambulatory referral to Urology  2. Gastroesophageal reflux disease without esophagitis  - famotidine (PEPCID) 40 MG tablet; Take 1 tablet (40 mg total) by mouth daily.  Dispense: 30 tablet; Refill: 2  3. Seasonal allergies  - montelukast (SINGULAIR) 10 MG tablet; Take 1 tablet (10 mg total) by mouth at bedtime.  Dispense: 30 tablet; Refill: 3  Follow up:  Follow up as scheduled

## 2022-12-28 NOTE — Patient Instructions (Signed)
1. Difficulty urinating  - Ambulatory referral to Urology  2. Gastroesophageal reflux disease without esophagitis  - famotidine (PEPCID) 40 MG tablet; Take 1 tablet (40 mg total) by mouth daily.  Dispense: 30 tablet; Refill: 2  3. Seasonal allergies  - montelukast (SINGULAIR) 10 MG tablet; Take 1 tablet (10 mg total) by mouth at bedtime.  Dispense: 30 tablet; Refill: 3  Follow up:  Follow up as scheduled

## 2022-12-28 NOTE — Telephone Encounter (Signed)
Please advise KH 

## 2022-12-28 NOTE — Progress Notes (Signed)
$'@Patient'l$  ID: Manuel Brooks, male    DOB: [birth year], 62 y.o.   MRN: [id number]  Chief Complaint  Patient presents with   lab work    Referring provider: Fenton Foy, NP   HPI  Patient presents today to discuss lab results.  He got the results in the mail but did not understand.  We discussed that all recent lab work was normal.  Patient is requesting a referral to urology.  Prostate level was good but patient is having difficulty urinating.  We will place referral today.  Patient is also requesting a new medication for reflux and allergies.  He states that he is having a difficult time sleeping at night.  Due to reflux and allergies.  We will start him on Singulair and Pepcid at bedtime. Denies f/c/s, n/v/d, hemoptysis, PND, leg swelling Denies chest pain or edema     No Known Allergies  Immunization History  Administered Date(s) Administered   Influenza,inj,Quad PF,6+ Mos 07/22/2014, 08/02/2016, 10/10/2018, 09/12/2020, 09/07/2022   Tdap 05/20/2018    Past Medical History:  Diagnosis Date   Allergy    CAP (community acquired pneumonia) 01/2019   Elevated liver enzymes 10/2019   Exertional shortness of breath 04/2019   GERD (gastroesophageal reflux disease)    H. pylori infection 03/2020   Hyperlipidemia 10/2019   Hypertension    Leucopenia    Vitamin D deficiency 11/2019    Tobacco History: Social History   Tobacco Use  Smoking Status Former   Packs/day: 0.50   Types: Cigarettes   Start date: 10/22/1988   Quit date: 10/22/1998   Years since quitting: 24.2  Smokeless Tobacco Never   Counseling given: Not Answered   Outpatient Encounter Medications as of 12/28/2022  Medication Sig   atorvastatin (LIPITOR) 10 MG tablet Take 1 tablet (10 mg total) by mouth daily.   chlorpheniramine (CHLOR-TRIMETON) 4 MG tablet Take 1 tablet (4 mg total) by mouth every 6 (six) hours as needed (cough).   famotidine (PEPCID) 40 MG tablet Take 1 tablet (40 mg total) by mouth daily.    fluticasone (FLONASE) 50 MCG/ACT nasal spray Place 2 sprays into both nostrils daily.   hydrocortisone (ANUSOL-HC) 2.5 % rectal cream Place 1 Application rectally 2 (two) times daily.   montelukast (SINGULAIR) 10 MG tablet Take 1 tablet (10 mg total) by mouth at bedtime.   tamsulosin (FLOMAX) 0.4 MG CAPS capsule Take 2 capsules (0.8 mg total) by mouth daily.   valsartan (DIOVAN) 80 MG tablet Take 1 tablet (80 mg total) by mouth daily.   hydrocortisone cream (PREPARATION H) 1 % Apply 1 Application topically 2 (two) times daily.   omeprazole (PRILOSEC) 20 MG capsule Take 1 capsule (20 mg total) by mouth at bedtime. (Patient not taking: Reported on 11/28/2022)   ondansetron (ZOFRAN) 4 MG tablet Take 1 tablet (4 mg total) by mouth every 8 (eight) hours as needed for nausea or vomiting. (Patient not taking: Reported on 11/28/2022)   pantoprazole (PROTONIX) 40 MG tablet Take 1 tablet (40 mg total) by mouth daily. (Patient not taking: Reported on 12/28/2022)   [DISCONTINUED] hydrocortisone cream (PREPARATION H) 1 % Apply 1 application topically 2 (two) times daily. (Patient not taking: Reported on 11/12/2022)   No facility-administered encounter medications on file as of 12/28/2022.     Review of Systems  Review of Systems     Physical Exam  BP 139/82   Pulse 70   Temp 97.9 F (36.6 C)   Wt 180 lb (81.6  kg)   SpO2 100%   BMI 31.89 kg/m   Wt Readings from Last 5 Encounters:  12/28/22 180 lb (81.6 kg)  11/28/22 181 lb 6.4 oz (82.3 kg)  11/12/22 184 lb 6.4 oz (83.6 kg)  11/12/22 184 lb 3.2 oz (83.6 kg)  09/10/22 181 lb (82.1 kg)     Physical Exam Vitals and nursing note reviewed.  Constitutional:      General: He is not in acute distress.    Appearance: He is well-developed.  Cardiovascular:     Rate and Rhythm: Normal rate and regular rhythm.  Pulmonary:     Effort: Pulmonary effort is normal.     Breath sounds: Normal breath sounds.  Skin:    General: Skin is warm and dry.   Neurological:     Mental Status: He is alert and oriented to person, place, and time.      Lab Results:  CBC    Component Value Date/Time   WBC 3.4 11/28/2022 1515   WBC 1.2 (LL) 10/16/2019 0956   RBC 5.29 11/28/2022 1515   RBC 5.54 10/16/2019 0956   HGB 14.4 11/28/2022 1515   HGB 14.9 09/27/2011 1015   HCT 44.4 11/28/2022 1515   HCT 44.6 09/27/2011 1015   PLT 237 11/28/2022 1515   MCV 84 11/28/2022 1515   MCV 82.4 09/27/2011 1015   MCH 27.2 11/28/2022 1515   MCH 26.9 10/16/2019 0956   MCHC 32.4 11/28/2022 1515   MCHC 33.0 10/16/2019 0956   RDW 13.7 11/28/2022 1515   RDW 14.3 09/27/2011 1015   LYMPHSABS 1.6 07/26/2021 1033   LYMPHSABS 1.9 09/27/2011 1015   MONOABS 0.2 10/16/2019 0956   MONOABS 0.4 09/27/2011 1015   EOSABS 0.3 07/26/2021 1033   BASOSABS 0.0 07/26/2021 1033   BASOSABS 0.0 09/27/2011 1015    BMET    Component Value Date/Time   NA 139 11/28/2022 1515   K 4.0 11/28/2022 1515   CL 101 11/28/2022 1515   CO2 24 11/28/2022 1515   GLUCOSE 88 11/28/2022 1515   GLUCOSE 108 (H) 10/16/2019 0956   BUN 10 11/28/2022 1515   CREATININE 1.00 11/28/2022 1515   CREATININE 1.00 10/30/2016 0839   CALCIUM 9.4 11/28/2022 1515   GFRNONAA 91 11/20/2019 1501   GFRNONAA 83 10/30/2016 0839   GFRAA 105 11/20/2019 1501   GFRAA >89 10/30/2016 0839    BNP No results found for: "BNP"  ProBNP No results found for: "PROBNP"  Imaging: No results found.   Assessment & Plan:   Difficulty urinating - Ambulatory referral to Urology  2. Gastroesophageal reflux disease without esophagitis  - famotidine (PEPCID) 40 MG tablet; Take 1 tablet (40 mg total) by mouth daily.  Dispense: 30 tablet; Refill: 2  3. Seasonal allergies  - montelukast (SINGULAIR) 10 MG tablet; Take 1 tablet (10 mg total) by mouth at bedtime.  Dispense: 30 tablet; Refill: 3  Follow up:  Follow up as scheduled     Fenton Foy, NP 12/28/2022

## 2023-01-24 DIAGNOSIS — R3915 Urgency of urination: Secondary | ICD-10-CM | POA: Diagnosis not present

## 2023-01-24 DIAGNOSIS — N401 Enlarged prostate with lower urinary tract symptoms: Secondary | ICD-10-CM | POA: Diagnosis not present

## 2023-01-25 ENCOUNTER — Encounter: Payer: Self-pay | Admitting: Gastroenterology

## 2023-02-10 ENCOUNTER — Other Ambulatory Visit: Payer: Self-pay | Admitting: Nurse Practitioner

## 2023-02-10 DIAGNOSIS — K219 Gastro-esophageal reflux disease without esophagitis: Secondary | ICD-10-CM

## 2023-02-11 ENCOUNTER — Encounter: Payer: Self-pay | Admitting: Gastroenterology

## 2023-02-11 ENCOUNTER — Ambulatory Visit (INDEPENDENT_AMBULATORY_CARE_PROVIDER_SITE_OTHER): Payer: Medicaid Other | Admitting: Gastroenterology

## 2023-02-11 VITALS — BP 148/88 | HR 66 | Ht 65.0 in | Wt 180.0 lb

## 2023-02-11 DIAGNOSIS — R1013 Epigastric pain: Secondary | ICD-10-CM

## 2023-02-11 DIAGNOSIS — K8020 Calculus of gallbladder without cholecystitis without obstruction: Secondary | ICD-10-CM

## 2023-02-11 DIAGNOSIS — K219 Gastro-esophageal reflux disease without esophagitis: Secondary | ICD-10-CM

## 2023-02-11 DIAGNOSIS — Z1211 Encounter for screening for malignant neoplasm of colon: Secondary | ICD-10-CM | POA: Diagnosis not present

## 2023-02-11 MED ORDER — SUTAB 1479-225-188 MG PO TABS: 1.0000 | ORAL_TABLET | ORAL | 0 refills | Status: DC

## 2023-02-11 MED ORDER — PANTOPRAZOLE SODIUM 40 MG PO TBEC: 40.0000 mg | DELAYED_RELEASE_TABLET | Freq: Two times a day (BID) | ORAL | 3 refills | Status: DC

## 2023-02-11 NOTE — Patient Instructions (Addendum)
If your blood pressure at your visit was 140/90 or greater, please contact your primary care physician to follow up on this.  _______________________________________________________  If you are age 64 or older, your body mass index should be between 23-30. Your Body mass index is 29.95 kg/m. If this is out of the aforementioned range listed, please consider follow up with your Primary Care Provider.  If you are age 91 or younger, your body mass index should be between 19-25. Your Body mass index is 29.95 kg/m. If this is out of the aformentioned range listed, please consider follow up with your Primary Care Provider.   ________________________________________________________  The [facility] providers would like to encourage you to use [facility] to communicate with providers for non-urgent requests or questions.  Due to long hold times on the telephone, sending your provider a message by [facility] may be a faster and more efficient way to get a response.  Please allow 48 business hours for a response.  Please remember that this is for non-urgent requests.  _______________________________________________________  Due to recent changes in healthcare laws, you may see the results of your imaging and laboratory studies on MyChart before your provider has had a chance to review them.  We understand that in some cases there may be results that are confusing or concerning to you. Not all laboratory results come back in the same time frame and the provider may be waiting for multiple results in order to interpret others.  Please give [place] 48 hours in order for your provider to thoroughly review all the results before contacting the office for clarification of your results.    You have been scheduled for a colonoscopy. Please follow written instructions given to you at your visit today.  Please pick up your prep supplies at the pharmacy within the next 1-3 days. If you use inhalers (even only as needed), please  bring them with you on the day of your procedure.  We are giving you a SUTAB sample today to use for your colonoscopy prep. Do NOT look at the instructions on the box. Follow the instructions that were given to you at today's visit.  We have sent the following medications to your pharmacy for you to pick up at your convenience: (script printed and given to patient at his request) Protonix: Take twice daily 30 minutes before meals.

## 2023-02-11 NOTE — Progress Notes (Signed)
HPI :  62 year old male60 with a history of gallstones, suspected GERD, reported history of H. 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  He states he originally has been having stomach problems since 1992.  Sounds like he had problems with her reflux or dyspepsia at that time, states he was given a medicine which made him feel worse initially, has a hard time recalling what he was taking.    It sounds like he has had intermittent burning in his chest and epigastric pain.  He reports having a prior cardiac issue and told that his heart was fine.  He does have endorsed reflux/pyrosis that bothers him.  It sounds like he also has some postnasal drip and clearance of his throat for which she is taking some over-the-counter antihistamines which she states helps.  He states eating spicy foods can make him feel worse.  He states when his epigastric pain bothers him he tries to lie down and it makes it better.  He denies any nausea or vomiting.  Weight seems to be stable.  Sounds like he was given a trial of Protonix 40 mg daily but not using it routinely, only as needed.  He states short use of it has not provided much benefit to date.  He also describes some periumbilical abdominal pain which can come and go.  He has 2-3 bowel movements per day, denies any constipation, no blood in his stools.  He denies any routine NSAID use.  Does not take aspirin.  Uses Tylenol as needed for pains.  He thinks he remotely had an EGD in 1998 when he was awake and [place], unsedated, states he did not tolerate it well, perhaps it was never done.  He never had a colonoscopy.  He denies any family history of GI tract malignancies.  He did have an ultrasound a few years ago showing gallstones and mild hepatic steatosis.  Has never seen a surgeon  for cholecystectomy.  He had a CT scan in October 2022 as outlined below  I see that he had a positive breath test for H. pylori 2 years ago and was treated for that, had a breath test and October 2022 which was negative for H. pylori.  He has had labs as outlined below in February which were normal, no concerning abnormalities.  Of note, he has Pepcid listed in his chart but it sounds like he has never taken that.  [place] complete 12/21/20: IMPRESSION: Cholelithiasis without sonographic evidence of acute cholecystitis.   Mild hepatic steatosis.   Possible mild left hydronephrosis. Renal pelvic dilatation versus parapelvic cysts could be differentiated with CT urogram or intravenous pyelogram if desired. However, given the lack of caliceal dilation and preserved renal cortex, this may be of limited clinical significance.   CT abdomen / pelvis 08/01/21: IMPRESSION: 1. No acute intra abdominopelvic findings. 2. Sigmoid colonic diverticulosis without findings of acute diverticulitis. 3. 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 4. Small hiatal hernia. 5. Peripheral predominant solid left-sided pulmonary nodules measuring up to 4 mm. No routine follow-up imaging is recommended per Fleischner Society Guidelines. These guidelines do not apply to immunocompromised patients and patients with cancer. Follow up in patients with significant comorbidities as clinically warranted. For lung cancer screening,  adhere to Lung-RADS guidelines. Reference: Radiology. 2017; 284(1):228-43.   Past Medical History:  Diagnosis Date   Allergy    CAP (community acquired pneumonia) 01/2019   Elevated liver enzymes 10/2019   Exertional shortness of breath 04/2019   GERD (gastroesophageal reflux disease)    H. pylori infection 03/2020   Hyperlipidemia 10/2019   Hypertension    Leucopenia    Vitamin D  deficiency 11/2019     Past Surgical History:  Procedure Laterality Date   BIOPSY BONE MARROW  09/21/2011       OTHER SURGICAL HISTORY     was unable to have childern and had surgery so he could have children 1990   Family History  Problem Relation Age of Onset   Colon cancer Neg Hx    Esophageal cancer Neg Hx    Social History   Tobacco Use   Smoking status: Former    Packs/day: .5    Types: Cigarettes    Start date: 10/22/1988    Quit date: 10/22/1998    Years since quitting: 24.3   Smokeless tobacco: Never  Vaping Use   Vaping Use: Never used  Substance Use Topics   Alcohol use: No   Drug use: No    Frequency: 1.0 times per week   Current Outpatient Medications  Medication Sig Dispense Refill   atorvastatin (LIPITOR) 10 MG tablet Take 1 tablet (10 mg total) by mouth daily. 90 tablet 1   chlorpheniramine (CHLOR-TRIMETON) 4 MG tablet Take 1 tablet (4 mg total) by mouth every 6 (six) hours as needed (cough). 30 tablet 0   pantoprazole (PROTONIX) 40 MG tablet TAKE 1 TABLET(40 MG) BY MOUTH DAILY 90 tablet 0   valsartan (DIOVAN) 80 MG tablet Take 1 tablet (80 mg total) by mouth daily. 30 tablet 11   famotidine (PEPCID) 40 MG tablet Take 1 tablet (40 mg total) by mouth daily. 30 tablet 2   fluticasone (FLONASE) 50 MCG/ACT nasal spray Place 2 sprays into both nostrils daily. 9.9 g 11   hydrocortisone (ANUSOL-HC) 2.5 % rectal cream APPLY RECTALLY TO THE AFFECTED AREA TWICE DAILY 30 g 0   hydrocortisone cream (PREPARATION H) 1 % Apply 1 Application topically 2 (two) times daily. 30 g 11   montelukast (SINGULAIR) 10 MG tablet Take 1 tablet (10 mg total) by mouth at bedtime. 30 tablet 3   ondansetron (ZOFRAN) 4 MG tablet Take 1 tablet (4 mg total) by mouth every 8 (eight) hours as needed for nausea or vomiting. 20 tablet 0   tamsulosin (FLOMAX) 0.4 MG CAPS capsule Take 2 capsules (0.8 mg total) by mouth daily. 60 capsule 2   No current facility-administered medications for this  visit.   No Known Allergies   Review of Systems: All systems reviewed and negative except where noted in HPI.   Lab Results  Component Value Date   WBC 3.4 11/28/2022   HGB 14.4 11/28/2022   HCT 44.4 11/28/2022   MCV 84 11/28/2022   PLT 237 11/28/2022    Lab Results  Component Value Date   CREATININE 1.00 11/28/2022   BUN 10 11/28/2022   NA 139 11/28/2022   K 4.0 11/28/2022   CL 101 11/28/2022   CO2 24 11/28/2022    Lab Results  Component Value Date   ALT 20 11/28/2022   AST 24 11/28/2022   ALKPHOS 81 11/28/2022   BILITOT 0.4 11/28/2022   Lab Results  Component Value Date   LIPASE 36 07/26/2021  Physical Exam: BP (!) 148/88   Pulse 66   Ht 5\' 5"  (1.651 m)   Wt 180 lb (81.6 kg)   BMI 29.95 kg/m  Constitutional: Pleasant,well-developed, male in no acute distress. HEENT: Normocephalic and atraumatic. Conjunctivae are normal. No scleral icterus. Neck supple.  Cardiovascular: Normal rate, regular rhythm.  Pulmonary/chest: Effort normal and breath sounds normal. No wheezing, rales or rhonchi. Abdominal: Soft, nondistended, nontender. There are no masses palpable. No hepatomegaly. Extremities: no edema Lymphadenopathy: No cervical adenopathy noted. Neurological: Alert and oriented to person place and time. Skin: Skin is warm and dry. No rashes noted. Psychiatric: Normal mood and affect. Behavior is normal.   ASSESSMENT: 62 y.o. male here for assessment of the following  1. Gastroesophageal reflux disease, unspecified whether esophagitis present   2. Abdominal pain, epigastric   3. Gallstones   4. Colon cancer screening    History provided via translation as outlined above.  I suspect he is likely having reflux symptoms based on description of it, he clearly has heartburn.  He has not been taking Protonix with any regularity.  A few doses here and there has not provided much benefit for him yet.  He is not using any NSAIDs and recommend he continue to  avoid this.  Discussed options given these chronic symptoms I recommend an upper endoscopy, assess for hiatal hernia, gastric ulcers, Barrett's, ensure no evidence of H. pylori given his history of this in the past.  We discussed risk benefits of endoscopy and anesthesia with him, he understands and wants to proceed.  In the interim, recommend he take Protonix 40 mg twice daily, 30 minutes prior to meal, for 30-day trial to see if this makes any difference in his symptoms or not.  If not and his EGD is unremarkable, his epigastric pain potentially could be biliary colic and may have him see a Careers adviser.  His history is somewhat difficult to obtain due to language barrier, I think EGD is reasonable to do first prior to committing him to a surgery.  He has never had a prior colonoscopy, bowels regular but occasional mid abdominal pain.  I offered him a colonoscopy to be done at the same time as his upper endoscopy.  He wishes to proceed following further discussion of this.  Further recommendations pending the results and his course.  PLAN: - protonix 40mg  BID - 30 minutes prior to meal for 30 day trial - 1 month supply with 3 refills - avoid NSAIDs - schedule EGD and colonoscopy at the [facility] - if EGD negative and upper tract symptoms of pain persist despite trial of high dose PPI, consider surgical evaluation for cholecystectomy in light of gallstones  Harlin Rain, MD [facility]  CC: Ivonne Andrew, NP

## 2023-03-05 ENCOUNTER — Encounter: Payer: Self-pay | Admitting: Gastroenterology

## 2023-03-05 ENCOUNTER — Ambulatory Visit (AMBULATORY_SURGERY_CENTER): Payer: Medicaid Other | Admitting: Gastroenterology

## 2023-03-05 VITALS — BP 127/79 | HR 52 | Temp 98.9°F | Resp 15 | Ht 65.0 in | Wt 180.0 lb

## 2023-03-05 DIAGNOSIS — Z1211 Encounter for screening for malignant neoplasm of colon: Secondary | ICD-10-CM | POA: Diagnosis not present

## 2023-03-05 DIAGNOSIS — D125 Benign neoplasm of sigmoid colon: Secondary | ICD-10-CM | POA: Diagnosis not present

## 2023-03-05 DIAGNOSIS — D122 Benign neoplasm of ascending colon: Secondary | ICD-10-CM

## 2023-03-05 DIAGNOSIS — K219 Gastro-esophageal reflux disease without esophagitis: Secondary | ICD-10-CM

## 2023-03-05 DIAGNOSIS — K2950 Unspecified chronic gastritis without bleeding: Secondary | ICD-10-CM | POA: Diagnosis not present

## 2023-03-05 DIAGNOSIS — R1013 Epigastric pain: Secondary | ICD-10-CM | POA: Diagnosis not present

## 2023-03-05 MED ORDER — SODIUM CHLORIDE 0.9 % IV SOLN: 500.0000 mL | Freq: Once | INTRAVENOUS | Status: DC

## 2023-03-05 NOTE — Op Note (Signed)
[facility] Patient Name: Manuel Brooks Procedure Date: 03/05/2023 8:29 AM MRN: [id number] Endoscopist: Viviann Spare P. Adela Lank , MD, [id number] Age: 62 Referring MD:  Date of Birth: 1961/04/24 Gender: Male Account #: [id number] Procedure:                Colonoscopy Indications:              Screening for colorectal malignant neoplasm, This                            is the patient's first colonoscopy Medicines:                Monitored Anesthesia Care Procedure:                Pre-Anesthesia Assessment:                           - Prior to the procedure, a History and Physical                            was performed, and patient medications and                            allergies were reviewed. The patient's tolerance of                            previous anesthesia was also reviewed. The risks                            and benefits of the procedure and the sedation                            options and risks were discussed with the patient.                            All questions were answered, and informed consent                            was obtained. Prior Anticoagulants: The patient has                            taken no anticoagulant or antiplatelet agents. ASA                            Grade Assessment: II - A patient with mild systemic                            disease. After reviewing the risks and benefits,                            the patient was deemed in satisfactory condition to                            undergo the procedure.  After obtaining informed consent, the colonoscope                            was passed under direct vision. Throughout the                            procedure, the patient's blood pressure, pulse, and                            oxygen saturations were monitored continuously. The                            Olympus [id number] SN [id number] was introduced through                            the anus and advanced  to the the terminal ileum,                            with identification of the appendiceal orifice and                            IC valve. The colonoscopy was performed without                            difficulty. The patient tolerated the procedure                            well. The quality of the bowel preparation was                            good. The terminal ileum, ileocecal valve,                            appendiceal orifice, and rectum were photographed. Scope In: 8:49:25 AM Scope Out: 9:06:32 AM Scope Withdrawal Time: 0 hours 15 minutes 23 seconds  Total Procedure Duration: 0 hours 17 minutes 7 seconds  Findings:                 The perianal and digital rectal examinations were                            normal.                           The terminal ileum appeared normal.                           A diminutive polyp was found in the ascending                            colon. The polyp was sessile. The polyp was removed                            with a cold snare. Resection and retrieval were  complete.                           A 3 mm polyp was found in the sigmoid colon. The                            polyp was sessile. The polyp was removed with a                            cold snare. Resection and retrieval were complete.                           Multiple small-mouthed diverticula were found in                            the sigmoid colon.                           Internal hemorrhoids were found during                            retroflexion. The hemorrhoids were small.                           The exam was otherwise without abnormality. Complications:            No immediate complications. Estimated blood loss:                            Minimal. Estimated Blood Loss:     Estimated blood loss was minimal. Impression:               - The examined portion of the ileum was normal.                           - One diminutive polyp in  the ascending colon,                            removed with a cold snare. Resected and retrieved.                           - One 3 mm polyp in the sigmoid colon, removed with                            a cold snare. Resected and retrieved.                           - Diverticulosis in the sigmoid colon.                           - Internal hemorrhoids.                           - The examination was otherwise normal.                           -  The GI Genius (intelligent endoscopy module),                            computer-aided polyp detection system powered by AI                            was utilized to detect colorectal polyps through                            enhanced visualization during colonoscopy. Recommendation:           - Patient has a contact number available for                            emergencies. The signs and symptoms of potential                            delayed complications were discussed with the                            patient. Return to normal activities tomorrow.                            Written discharge instructions were provided to the                            patient.                           - Resume previous diet.                           - Continue present medications.                           - Await pathology results. Viviann Spare P. Ami Thornsberry, MD 03/05/2023 9:12:54 AM This report has been signed electronically.

## 2023-03-05 NOTE — Op Note (Signed)
[facility] Patient Name: Manuel Brooks Procedure Date: 03/05/2023 8:34 AM MRN: [id number] Endoscopist: Viviann Spare P. Adela Lank , MD, [id number] Age: 62 Referring MD:  Date of Birth: [birth year] Gender: Male Account #: [id number] Procedure:                Upper GI endoscopy Indications:              Epigastric abdominal pain, Suspected                            gastro-esophageal reflux disease - increased                            protonix to 40mg  twice daily which has provided                            some benefit, reported history of H pylori in the                            past Medicines:                Monitored Anesthesia Care Procedure:                Pre-Anesthesia Assessment:                           - Prior to the procedure, a History and Physical                            was performed, and patient medications and                            allergies were reviewed. The patient's tolerance of                            previous anesthesia was also reviewed. The risks                            and benefits of the procedure and the sedation                            options and risks were discussed with the patient.                            All questions were answered, and informed consent                            was obtained. Prior Anticoagulants: The patient has                            taken no anticoagulant or antiplatelet agents. ASA                            Grade Assessment: II - A patient with mild systemic  disease. After reviewing the risks and benefits,                            the patient was deemed in satisfactory condition to                            undergo the procedure.                           After obtaining informed consent, the endoscope was                            passed under direct vision. Throughout the                            procedure, the patient's blood pressure, pulse, and                             oxygen saturations were monitored continuously. The                            Olympus Scope [id number] was introduced through the                            mouth, and advanced to the second part of duodenum.                            The upper GI endoscopy was accomplished without                            difficulty. The patient tolerated the procedure                            well. Scope In: Scope Out: Findings:                 Esophagogastric landmarks were identified: the                            Z-line was found at 36 cm, the gastroesophageal                            junction was found at 36 cm and the upper extent of                            the gastric folds was found at 39 cm from the                            incisors.                           A 3 cm hiatal hernia was present.                           The exam of the esophagus was  otherwise normal.                           The entire examined stomach was normal. Biopsies                            were taken with a cold forceps for Helicobacter                            pylori testing.                           The examined duodenum was normal. Complications:            No immediate complications. Estimated blood loss:                            Minimal. Estimated Blood Loss:     Estimated blood loss was minimal. Impression:               - Esophagogastric landmarks identified.                           - 3 cm hiatal hernia.                           - Normal esophagus otherwise.                           - Normal stomach. Biopsied for H pylori eradication                            testing.                           - Normal examined duodenum.                           Suspect GERD is contributing to symptoms, has                            improved with twice daily dosing of protonix. Recommendation:           - Patient has a contact number available for                            emergencies. The signs  and symptoms of potential                            delayed complications were discussed with the                            patient. Return to normal activities tomorrow.                            Written discharge instructions were provided to the  patient.                           - Resume previous diet.                           - Continue present medications.                           - Continue twice daily dosing of protonix for now,                            long term use lowest dose needed to control symptoms                           - Await pathology results. Viviann Spare P. Santi Troung, MD 03/05/2023 9:16:28 AM This report has been signed electronically.

## 2023-03-05 NOTE — Progress Notes (Signed)
Sedate, gd SR's, VSS, report to RN 

## 2023-03-05 NOTE — Progress Notes (Signed)
History and Physical Interval Note: Patient seen on 02/11/23 - no interval changes. On protonix 40mg  BID now, history of H pylori. EGD to further evaluate upper tract symptoms, colonoscopy to evaluate abdominal pain and for screening. He has felt better on protonix since seen in the office.  03/05/2023 8:36 AM  Manuel Brooks  has presented today for endoscopic procedure(s), with the diagnosis of  Encounter Diagnoses  Name Primary?   Abdominal pain, epigastric    Gastroesophageal reflux disease, unspecified whether esophagitis present Yes   Colon cancer screening   .  The various methods of evaluation and treatment have been discussed with the patient and/or family. After consideration of risks, benefits and other options for treatment, the patient has consented to  the endoscopic procedure(s).   The patient's history has been reviewed, patient examined, no change in status, stable for surgery.  I have reviewed the patient's chart and labs.  Questions were answered to the patient's satisfaction.    Harlin Rain, MD [facility]

## 2023-03-05 NOTE — Progress Notes (Signed)
Pt's states no medical or surgical changes since previsit or office visit. Feryl Arab interpreter assisted with Admitting process.

## 2023-03-05 NOTE — Patient Instructions (Signed)
Handout on hemorrhoids, diverticulosis, GERD, and polyps  Await pathology results Resume previous diet and continue present medications  Continue twice daily dose of Protonix for now, long term use lowest dose needed to control symptoms  Repeat colonoscopy for surveillance will be determined based off of pathology results    YOU HAD AN ENDOSCOPIC PROCEDURE TODAY AT THE [facility]:   Refer to the procedure report that was given to you for any specific questions about what was found during the examination.  If the procedure report does not answer your questions, please call your gastroenterologist to clarify.  If you requested that your care partner not be given the details of your procedure findings, then the procedure report has been included in a sealed envelope for you to review at your convenience later.  YOU SHOULD EXPECT: Some feelings of bloating in the abdomen. Passage of more gas than usual.  Walking can help get rid of the air that was put into your GI tract during the procedure and reduce the bloating. If you had a lower endoscopy (such as a colonoscopy or flexible sigmoidoscopy) you may notice spotting of blood in your stool or on the toilet paper. If you underwent a bowel prep for your procedure, you may not have a normal bowel movement for a few days.  Please Note:  You might notice some irritation and congestion in your nose or some drainage.  This is from the oxygen used during your procedure.  There is no need for concern and it should clear up in a day or so.  SYMPTOMS TO REPORT IMMEDIATELY:  Following lower endoscopy (colonoscopy or flexible sigmoidoscopy):  Excessive amounts of blood in the stool  Significant tenderness or worsening of abdominal pains  Swelling of the abdomen that is new, acute  Fever of 100F or higher  Following upper endoscopy (EGD)  Vomiting of blood or coffee ground material  New chest pain or pain under the shoulder blades  Painful or  persistently difficult swallowing  New shortness of breath  Fever of 100F or higher  Black, tarry-looking stools  For urgent or emergent issues, a gastroenterologist can be reached at any hour by calling [phone]. Do not use MyChart messaging for urgent concerns.    DIET:  We do recommend a small meal at first, but then you may proceed to your regular diet.  Drink plenty of fluids but you should avoid alcoholic beverages for 24 hours.  ACTIVITY:  You should plan to take it easy for the rest of today and you should NOT DRIVE or use heavy machinery until tomorrow (because of the sedation medicines used during the test).    FOLLOW UP: Our staff will call the number listed on your records the next business day following your procedure.  We will call around 7:15- 8:00 am to check on you and address any questions or concerns that you may have regarding the information given to you following your procedure. If we do not reach you, we will leave a message.     If any biopsies were taken you will be contacted by phone or by letter within the next 1-3 weeks.  Please call us at [phone] if you have not heard about the biopsies in 3 weeks.    SIGNATURES/CONFIDENTIALITY: You and/or your care partner have signed paperwork which will be entered into your electronic medical record.  These signatures attest to the fact that that the information above on your After Visit Summary has  been reviewed and is understood.  Full responsibility of the confidentiality of this discharge information lies with you and/or your care-partner.

## 2023-03-05 NOTE — Progress Notes (Signed)
Called to room to assist during endoscopic procedure.  Patient ID and intended procedure confirmed with present staff. Received instructions for my participation in the procedure from the performing physician.  

## 2023-03-06 ENCOUNTER — Telehealth: Payer: Self-pay

## 2023-03-06 NOTE — Telephone Encounter (Signed)
  Follow up Call-     03/05/2023    7:41 AM  Call back number  Post procedure Call Back phone  # [phone]  Permission to leave phone message Yes     Patient questions:  Do you have a fever, pain , or abdominal swelling? No. Pain Score  0 *  Have you tolerated food without any problems? Yes.    Have you been able to return to your normal activities? Yes.    Do you have any questions about your discharge instructions: Diet   No. Medications  No. Follow up visit  No.  Do you have questions or concerns about your Care? No.  Actions: * If pain score is 4 or above: No action needed, pain <4.

## 2023-03-13 ENCOUNTER — Ambulatory Visit: Payer: Medicaid Other | Admitting: Nurse Practitioner

## 2023-03-13 ENCOUNTER — Encounter: Payer: Self-pay | Admitting: Nurse Practitioner

## 2023-03-13 VITALS — BP 127/83 | HR 72 | Temp 98.2°F | Wt 177.4 lb

## 2023-03-13 DIAGNOSIS — Z13220 Encounter for screening for lipoid disorders: Secondary | ICD-10-CM | POA: Diagnosis not present

## 2023-03-13 NOTE — Progress Notes (Signed)
@Patient  ID: Manuel Brooks, male    DOB: [birth year], 62 y.o.   MRN: [id number]  Chief Complaint  Patient presents with   Follow-up   Hypertension    Referring provider: Ivonne Andrew, NP   HPI  Manuel Brooks presents for follow up. He  has a past medical history of Allergy, CAP (community acquired pneumonia) (01/2019), Elevated liver enzymes (10/2019), Exertional shortness of breath (04/2019), GERD (gastroesophageal reflux disease), H. pylori infection (03/2020), Hyperlipidemia (10/2019), Hypertension, Leucopenia, and Vitamin D deficiency (11/2019).    Hypertension Patient is here for follow-up of elevated blood pressure. He is not exercising and is adherent to a low-salt diet. Blood pressure is not monitored at home. Cardiac symptoms: none. Patient denies chest pain, dyspnea, fatigue, lower extremity edema, palpitations, and syncope. Cardiovascular risk factors: dyslipidemia, hypertension, male gender, and obesity (BMI >= 30 kg/m2). Use of agents associated with hypertension: none. History of target organ damage: none.   Dyslipidemia Patient presents for evaluation of lipids. Compliance with treatment thus far has been good. A repeat fasting lipid profile was done. The patient does not use medications that may worsen dyslipidemias (corticosteroids, progestins, anabolic steroids, diuretics, beta-blockers, amiodarone, cyclosporine, olanzapine). The patient exercises occasionally.  Note: patient has followed with GI and urology  No Known Allergies  Immunization History  Administered Date(s) Administered   Influenza,inj,Quad PF,6+ Mos 07/22/2014, 08/02/2016, 10/10/2018, 09/12/2020, 09/07/2022   Tdap 05/20/2018    Past Medical History:  Diagnosis Date   Allergy    CAP (community acquired pneumonia) 01/2019   Elevated liver enzymes 10/2019   Exertional shortness of breath 04/2019   GERD (gastroesophageal reflux disease)    H. pylori infection 03/2020   Hyperlipidemia 10/2019    Hypertension    Leucopenia    Vitamin D deficiency 11/2019    Tobacco History: Social History   Tobacco Use  Smoking Status Former   Packs/day: .5   Types: Cigarettes   Start date: 10/22/1988   Quit date: 10/22/1998   Years since quitting: 24.4  Smokeless Tobacco Never   Counseling given: Not Answered   Outpatient Encounter Medications as of 03/13/2023  Medication Sig   atorvastatin (LIPITOR) 10 MG tablet Take 1 tablet (10 mg total) by mouth daily.   chlorpheniramine (CHLOR-TRIMETON) 4 MG tablet Take 1 tablet (4 mg total) by mouth every 6 (six) hours as needed (cough).   famotidine (PEPCID) 40 MG tablet Take 1 tablet (40 mg total) by mouth daily.   hydrocortisone (ANUSOL-HC) 2.5 % rectal cream APPLY RECTALLY TO THE AFFECTED AREA TWICE DAILY   hydrocortisone cream (PREPARATION H) 1 % Apply 1 Application topically 2 (two) times daily.   montelukast (SINGULAIR) 10 MG tablet Take 1 tablet (10 mg total) by mouth at bedtime.   ondansetron (ZOFRAN) 4 MG tablet Take 1 tablet (4 mg total) by mouth every 8 (eight) hours as needed for nausea or vomiting.   pantoprazole (PROTONIX) 40 MG tablet Take 1 tablet (40 mg total) by mouth 2 (two) times daily before a meal. Take 30 minutes before meals   tamsulosin (FLOMAX) 0.4 MG CAPS capsule TAKE 2 CAPSULES(0.8 MG) BY MOUTH DAILY   valsartan (DIOVAN) 80 MG tablet Take 1 tablet (80 mg total) by mouth daily.   fluticasone (FLONASE) 50 MCG/ACT nasal spray Place 2 sprays into both nostrils daily.   No facility-administered encounter medications on file as of 03/13/2023.     Review of Systems  Review of Systems  Constitutional: Negative.   HENT: Negative.  Cardiovascular: Negative.   Gastrointestinal: Negative.   Allergic/Immunologic: Negative.   Neurological: Negative.   Psychiatric/Behavioral: Negative.         Physical Exam  BP 127/83   Pulse 72   Temp 98.2 F (36.8 C)   Wt 177 lb 6.4 oz (80.5 kg)   SpO2 100%   BMI 29.52 kg/m    Wt Readings from Last 5 Encounters:  03/13/23 177 lb 6.4 oz (80.5 kg)  03/05/23 180 lb (81.6 kg)  02/11/23 180 lb (81.6 kg)  12/28/22 180 lb (81.6 kg)  11/28/22 181 lb 6.4 oz (82.3 kg)     Physical Exam Vitals and nursing note reviewed.  Constitutional:      General: He is not in acute distress.    Appearance: He is well-developed.  Cardiovascular:     Rate and Rhythm: Normal rate and regular rhythm.  Pulmonary:     Effort: Pulmonary effort is normal.     Breath sounds: Normal breath sounds.  Skin:    General: Skin is warm and dry.  Neurological:     Mental Status: He is alert and oriented to person, place, and time.      Lab Results:  CBC    Component Value Date/Time   WBC 3.4 11/28/2022 1515   WBC 1.2 (LL) 10/16/2019 0956   RBC 5.29 11/28/2022 1515   RBC 5.54 10/16/2019 0956   HGB 14.4 11/28/2022 1515   HGB 14.9 09/27/2011 1015   HCT 44.4 11/28/2022 1515   HCT 44.6 09/27/2011 1015   PLT 237 11/28/2022 1515   MCV 84 11/28/2022 1515   MCV 82.4 09/27/2011 1015   MCH 27.2 11/28/2022 1515   MCH 26.9 10/16/2019 0956   MCHC 32.4 11/28/2022 1515   MCHC 33.0 10/16/2019 0956   RDW 13.7 11/28/2022 1515   RDW 14.3 09/27/2011 1015   LYMPHSABS 1.6 07/26/2021 1033   LYMPHSABS 1.9 09/27/2011 1015   MONOABS 0.2 10/16/2019 0956   MONOABS 0.4 09/27/2011 1015   EOSABS 0.3 07/26/2021 1033   BASOSABS 0.0 07/26/2021 1033   BASOSABS 0.0 09/27/2011 1015    BMET    Component Value Date/Time   NA 139 11/28/2022 1515   K 4.0 11/28/2022 1515   CL 101 11/28/2022 1515   CO2 24 11/28/2022 1515   GLUCOSE 88 11/28/2022 1515   GLUCOSE 108 (H) 10/16/2019 0956   BUN 10 11/28/2022 1515   CREATININE 1.00 11/28/2022 1515   CREATININE 1.00 10/30/2016 0839   CALCIUM 9.4 11/28/2022 1515   GFRNONAA 91 11/20/2019 1501   GFRNONAA 83 10/30/2016 0839   GFRAA 105 11/20/2019 1501   GFRAA >89 10/30/2016 0839      Assessment & Plan:   Lipid screening - Lipid Panel   Follow  up:  Follow up in 6 months     Ivonne Andrew, NP 03/13/2023

## 2023-03-13 NOTE — Patient Instructions (Addendum)
1. Lipid screening  - Lipid Panel   Follow up:  Follow up in 6 months

## 2023-03-13 NOTE — Assessment & Plan Note (Signed)
-   Lipid Panel   Follow up:  Follow up in 6 months  

## 2023-03-14 LAB — LIPID PANEL
Chol/HDL Ratio: 2.5 ratio (ref 0.0–5.0)
Cholesterol, Total: 124 mg/dL (ref 100–199)
HDL: 49 mg/dL (ref >39)
LDL Chol Calc (NIH): 62 mg/dL (ref 0–99)
Triglycerides: 63 mg/dL (ref 0–149)
VLDL Cholesterol Cal: 13 mg/dL (ref 5–40)

## 2023-03-22 DIAGNOSIS — N401 Enlarged prostate with lower urinary tract symptoms: Secondary | ICD-10-CM | POA: Diagnosis not present

## 2023-03-22 DIAGNOSIS — R3915 Urgency of urination: Secondary | ICD-10-CM | POA: Diagnosis not present

## 2023-04-17 ENCOUNTER — Other Ambulatory Visit: Payer: Self-pay

## 2023-04-17 DIAGNOSIS — K649 Unspecified hemorrhoids: Secondary | ICD-10-CM

## 2023-04-17 MED ORDER — HYDROCORTISONE (PERIANAL) 2.5 % EX CREA
TOPICAL_CREAM | CUTANEOUS | 0 refills | Status: AC
Start: 2023-04-17 — End: ?
  Filled 2023-04-17: qty 30, 30d supply, fill #0

## 2023-04-17 NOTE — Telephone Encounter (Signed)
Please advise I this ok to fill. kh

## 2023-04-23 ENCOUNTER — Other Ambulatory Visit: Payer: Self-pay

## 2023-05-11 ENCOUNTER — Other Ambulatory Visit: Payer: Self-pay | Admitting: Nurse Practitioner

## 2023-05-11 DIAGNOSIS — E782 Mixed hyperlipidemia: Secondary | ICD-10-CM

## 2023-05-11 DIAGNOSIS — K219 Gastro-esophageal reflux disease without esophagitis: Secondary | ICD-10-CM

## 2023-05-16 ENCOUNTER — Other Ambulatory Visit: Payer: Self-pay

## 2023-05-16 ENCOUNTER — Telehealth: Payer: Self-pay | Admitting: Nurse Practitioner

## 2023-05-16 DIAGNOSIS — J302 Other seasonal allergic rhinitis: Secondary | ICD-10-CM

## 2023-05-16 MED ORDER — VALSARTAN 80 MG PO TABS
80.0000 mg | ORAL_TABLET | Freq: Every day | ORAL | 11 refills | Status: DC
  Filled 2023-05-16: qty 30, 30d supply, fill #0

## 2023-05-16 MED ORDER — MONTELUKAST SODIUM 10 MG PO TABS
10.0000 mg | ORAL_TABLET | Freq: Every day | ORAL | 3 refills | Status: DC
Start: 2023-05-16 — End: 2023-09-16
  Filled 2023-05-16: qty 30, 30d supply, fill #0

## 2023-05-16 NOTE — Telephone Encounter (Signed)
Atorvastatin was not sent in today due to it being done 05/13/23. All others sent in today.  Renelda Loma RMA

## 2023-05-16 NOTE — Telephone Encounter (Signed)
Caller & Relationship to patient:  MRN #  [id number]   Call Back Number:   Date of Last Office Visit: 05/11/2023     Date of Next Office Visit: 09/13/2023    Medication(s) to be Refilled: Cholesterol BP and allergy med refill  Preferred Pharmacy:   ** Please notify patient to allow 48-72 hours to process** **Let patient know to contact pharmacy at the end of the day to make sure medication is ready. ** **If patient has not been seen in a year or longer, book an appointment **Advise to use MyChart for refill requests OR to contact their pharmacy

## 2023-05-22 ENCOUNTER — Other Ambulatory Visit: Payer: Self-pay

## 2023-06-27 ENCOUNTER — Other Ambulatory Visit: Payer: Self-pay | Admitting: Nurse Practitioner

## 2023-06-28 ENCOUNTER — Other Ambulatory Visit: Payer: Self-pay | Admitting: *Deleted

## 2023-06-28 ENCOUNTER — Telehealth: Payer: Self-pay | Admitting: Nurse Practitioner

## 2023-06-28 MED ORDER — VALSARTAN 80 MG PO TABS: 80.0000 mg | ORAL_TABLET | Freq: Every day | ORAL | 1 refills | Status: DC

## 2023-07-01 NOTE — Telephone Encounter (Signed)
Error

## 2023-08-20 ENCOUNTER — Other Ambulatory Visit: Payer: Self-pay | Admitting: Nurse Practitioner

## 2023-09-13 ENCOUNTER — Ambulatory Visit: Payer: Medicaid Other | Admitting: Nurse Practitioner

## 2023-09-13 ENCOUNTER — Encounter: Payer: Self-pay | Admitting: Nurse Practitioner

## 2023-09-13 VITALS — BP 134/95 | HR 68 | Temp 98.6°F | Resp 12 | Ht 65.0 in | Wt 190.0 lb

## 2023-09-13 DIAGNOSIS — I10 Essential (primary) hypertension: Secondary | ICD-10-CM

## 2023-09-13 DIAGNOSIS — E782 Mixed hyperlipidemia: Secondary | ICD-10-CM

## 2023-09-13 MED ORDER — HYDROCORTISONE 1 % EX CREA: 1.0000 | TOPICAL_CREAM | Freq: Two times a day (BID) | CUTANEOUS | 11 refills | Status: AC

## 2023-09-13 NOTE — Patient Instructions (Signed)
1. Essential hypertension  - CBC - Comprehensive metabolic panel  2. Mixed hyperlipidemia  -will recheck at next visit  Follow up:  Follow up in 6 months

## 2023-09-13 NOTE — Progress Notes (Signed)
Subjective   Patient ID: Manuel Brooks, male    DOB: [birth year], 62 y.o.   MRN: [id number]  Chief Complaint  Patient presents with   Follow-up    Referring provider: Ivonne Andrew, NP  Manuel Brooks is a 62 y.o. male with Past Medical History: No date: Allergy 01/2019: CAP (community acquired pneumonia) 10/2019: Elevated liver enzymes 04/2019: Exertional shortness of breath No date: GERD (gastroesophageal reflux disease) 03/2020: H. pylori infection 10/2019: Hyperlipidemia No date: Hypertension No date: Leucopenia 11/2019: Vitamin D deficiency   HPI  Hypertension  Patient is here for follow-up of elevated blood pressure. He is not exercising and is adherent to a low-salt diet. Blood pressure is not monitored at home. Cardiac symptoms: none. Patient denies chest pain, dyspnea, fatigue, lower extremity edema, palpitations, and syncope. Cardiovascular risk factors: dyslipidemia, hypertension, male gender, and obesity (BMI >= 30 kg/m2). Use of agents associated with hypertension: none. History of target organ damage: none.    Dyslipidemia   Patient presents for evaluation of lipids. Compliance with treatment thus far has been good. A repeat fasting lipid profile was done. The patient does not use medications that may worsen dyslipidemias (corticosteroids, progestins, anabolic steroids, diuretics, beta-blockers, amiodarone, cyclosporine, olanzapine). The patient exercises occasionally. Lipid panel at last visit was normal. Will not need recheck today.   Note: patient has followed with GI and urology  Requesting refill on hemorrhoid cream   No Known Allergies  Immunization History  Administered Date(s) Administered   Influenza,inj,Quad PF,6+ Mos 07/22/2014, 08/02/2016, 10/10/2018, 09/12/2020, 09/07/2022   Tdap 05/20/2018    Tobacco History: Social History   Tobacco Use  Smoking Status Former   Current packs/day: 0.00   Average packs/day: 0.5 packs/day for 10.0 years (5.0 ttl  pk-yrs)   Types: Cigarettes   Start date: 10/22/1988   Quit date: 10/22/1998   Years since quitting: 24.9  Smokeless Tobacco Never   Counseling given: Not Answered   Outpatient Encounter Medications as of 09/13/2023  Medication Sig   atorvastatin (LIPITOR) 10 MG tablet TAKE 1 TABLET(10 MG) BY MOUTH DAILY   chlorpheniramine (CHLOR-TRIMETON) 4 MG tablet Take 1 tablet (4 mg total) by mouth every 6 (six) hours as needed (cough).   fluticasone (FLONASE) 50 MCG/ACT nasal spray Place 2 sprays into both nostrils daily.   hydrocortisone (ANUSOL-HC) 2.5 % rectal cream APPLY RECTALLY TO THE AFFECTED AREA TWICE DAILY   montelukast (SINGULAIR) 10 MG tablet Take 1 tablet (10 mg total) by mouth at bedtime.   ondansetron (ZOFRAN) 4 MG tablet Take 1 tablet (4 mg total) by mouth every 8 (eight) hours as needed for nausea or vomiting.   pantoprazole (PROTONIX) 40 MG tablet TAKE 1 TABLET(40 MG) BY MOUTH DAILY   tamsulosin (FLOMAX) 0.4 MG CAPS capsule TAKE 2 CAPSULES(0.8 MG) BY MOUTH DAILY   valsartan (DIOVAN) 80 MG tablet TAKE 1 TABLET(80 MG) BY MOUTH DAILY   [DISCONTINUED] hydrocortisone cream (PREPARATION H) 1 % Apply 1 Application topically 2 (two) times daily.   famotidine (PEPCID) 40 MG tablet Take 1 tablet (40 mg total) by mouth daily.   hydrocortisone cream 1 % Apply 1 Application topically 2 (two) times daily.   No facility-administered encounter medications on file as of 09/13/2023.    Review of Systems  Review of Systems  Constitutional: Negative.   HENT: Negative.    Cardiovascular: Negative.   Gastrointestinal: Negative.   Allergic/Immunologic: Negative.   Neurological: Negative.   Psychiatric/Behavioral: Negative.       Objective:   BP Marland Kitchen)  134/95 (BP Location: Right Arm, Patient Position: Sitting, Cuff Size: Normal)   Pulse 68   Temp 98.6 F (37 C)   Resp 12   Ht 5\' 5"  (1.651 m)   Wt 190 lb (86.2 kg)   SpO2 100%   BMI 31.62 kg/m   Wt Readings from Last 5 Encounters:   09/13/23 190 lb (86.2 kg)  03/13/23 177 lb 6.4 oz (80.5 kg)  03/05/23 180 lb (81.6 kg)  02/11/23 180 lb (81.6 kg)  12/28/22 180 lb (81.6 kg)     Physical Exam Vitals and nursing note reviewed.  Constitutional:      General: He is not in acute distress.    Appearance: He is well-developed.  Cardiovascular:     Rate and Rhythm: Normal rate and regular rhythm.  Pulmonary:     Effort: Pulmonary effort is normal.     Breath sounds: Normal breath sounds.  Skin:    General: Skin is warm and dry.  Neurological:     Mental Status: He is alert and oriented to person, place, and time.       Assessment & Plan:   Essential hypertension -     CBC -     Comprehensive metabolic panel  Mixed hyperlipidemia  Other orders -     Hydrocortisone; Apply 1 Application topically 2 (two) times daily.  Dispense: 30 g; Refill: 11     Return in about 6 months (around 03/12/2024).   Ivonne Andrew, NP 09/13/2023

## 2023-09-14 ENCOUNTER — Other Ambulatory Visit: Payer: Self-pay | Admitting: Nurse Practitioner

## 2023-09-14 DIAGNOSIS — Z889 Allergy status to unspecified drugs, medicaments and biological substances status: Secondary | ICD-10-CM

## 2023-09-14 DIAGNOSIS — R053 Chronic cough: Secondary | ICD-10-CM

## 2023-09-14 LAB — COMPREHENSIVE METABOLIC PANEL
ALT: 19 [IU]/L (ref 0–44)
AST: 24 [IU]/L (ref 0–40)
Albumin: 4.2 g/dL (ref 3.9–4.9)
Alkaline Phosphatase: 86 [IU]/L (ref 44–121)
BUN/Creatinine Ratio: 17 (ref 10–24)
BUN: 19 mg/dL (ref 8–27)
Bilirubin Total: 0.4 mg/dL (ref 0.0–1.2)
CO2: 25 mmol/L (ref 20–29)
Calcium: 9.2 mg/dL (ref 8.6–10.2)
Chloride: 101 mmol/L (ref 96–106)
Creatinine, Ser: 1.11 mg/dL (ref 0.76–1.27)
Globulin, Total: 2.8 g/dL (ref 1.5–4.5)
Glucose: 91 mg/dL (ref 70–99)
Potassium: 4.4 mmol/L (ref 3.5–5.2)
Sodium: 138 mmol/L (ref 134–144)
Total Protein: 7 g/dL (ref 6.0–8.5)
eGFR: 74 mL/min/{1.73_m2} (ref >59)

## 2023-09-14 LAB — CBC
Hematocrit: 47 % (ref 37.5–51.0)
Hemoglobin: 14.6 g/dL (ref 13.0–17.7)
MCH: 26.8 pg (ref 26.6–33.0)
MCHC: 31.1 g/dL — ABNORMAL LOW (ref 31.5–35.7)
MCV: 86 fL (ref 79–97)
Platelets: 233 10*3/uL (ref 150–450)
RBC: 5.45 x10E6/uL (ref 4.14–5.80)
RDW: 13.8 % (ref 11.6–15.4)
WBC: 3.4 10*3/uL (ref 3.4–10.8)

## 2023-09-15 ENCOUNTER — Other Ambulatory Visit: Payer: Self-pay | Admitting: Nurse Practitioner

## 2023-09-15 DIAGNOSIS — J302 Other seasonal allergic rhinitis: Secondary | ICD-10-CM

## 2023-10-09 ENCOUNTER — Other Ambulatory Visit: Payer: Self-pay | Admitting: Nurse Practitioner

## 2023-11-08 ENCOUNTER — Other Ambulatory Visit: Payer: Self-pay | Admitting: Nurse Practitioner

## 2023-11-08 DIAGNOSIS — E782 Mixed hyperlipidemia: Secondary | ICD-10-CM

## 2023-11-26 ENCOUNTER — Other Ambulatory Visit: Payer: Self-pay | Admitting: Nurse Practitioner

## 2023-11-26 DIAGNOSIS — K219 Gastro-esophageal reflux disease without esophagitis: Secondary | ICD-10-CM

## 2023-12-24 ENCOUNTER — Other Ambulatory Visit: Payer: Self-pay | Admitting: Nurse Practitioner

## 2023-12-25 ENCOUNTER — Other Ambulatory Visit: Payer: Self-pay | Admitting: Nurse Practitioner

## 2023-12-31 ENCOUNTER — Ambulatory Visit (INDEPENDENT_AMBULATORY_CARE_PROVIDER_SITE_OTHER): Admitting: Primary Care

## 2023-12-31 ENCOUNTER — Ambulatory Visit: Payer: Self-pay | Admitting: Nurse Practitioner

## 2023-12-31 VITALS — BP 134/88 | HR 81 | Wt 184.2 lb

## 2023-12-31 DIAGNOSIS — H5713 Ocular pain, bilateral: Secondary | ICD-10-CM

## 2023-12-31 NOTE — Progress Notes (Unsigned)
 [facility]  Manuel Brooks, is a 63 y.o. male  ZOX:[id number]  WJX:[id number]  DOB - 03/31/1961  Chief Complaint  Patient presents with  . Eye Injury    Hot water        Subjective:   Manuel Brooks is a 63 y.o. male here today for an acute visit. Patient was at work and was in the process of taking a valve cap off, Stated that he took off the cap pressurized hot water went into his face and eyes. Patient  is here with pain in right eye and states no visual changes.  HPI  No problems updated.  Comprehensive ROS Pertinent positive and negative noted in HPI   No Known Allergies  Past Medical History:  Diagnosis Date  . Allergy   . CAP (community acquired pneumonia) 01/2019  . Elevated liver enzymes 10/2019  . Exertional shortness of breath 04/2019  . GERD (gastroesophageal reflux disease)   . H. pylori infection 03/2020  . Hyperlipidemia 10/2019  . Hypertension   . Leucopenia   . Vitamin D deficiency 11/2019    Current Outpatient Medications on File Prior to Visit  Medication Sig Dispense Refill  . ALLERGY 4 MG tablet TAKE 1 TABLET(4 MG) BY MOUTH EVERY 6 HOURS AS NEEDED FOR COUGH 30 tablet 0  . atorvastatin (LIPITOR) 10 MG tablet TAKE 1 TABLET(10 MG) BY MOUTH DAILY 90 tablet 1  . famotidine (PEPCID) 40 MG tablet Take 1 tablet (40 mg total) by mouth daily. 30 tablet 2  . fluticasone (FLONASE) 50 MCG/ACT nasal spray SHAKE LIQUID AND USE 2 SPRAYS IN EACH NOSTRIL DAILY 16 g 2  . hydrocortisone (ANUSOL-HC) 2.5 % rectal cream APPLY RECTALLY TO THE AFFECTED AREA TWICE DAILY 30 g 0  . hydrocortisone cream 1 % Apply 1 Application topically 2 (two) times daily. 30 g 11  . montelukast (SINGULAIR) 10 MG tablet TAKE 1 TABLET(10 MG) BY MOUTH AT BEDTIME 30 tablet 3  . ondansetron (ZOFRAN) 4 MG tablet Take 1 tablet (4 mg total) by mouth every 8 (eight) hours as needed for nausea or vomiting. 20 tablet 0  . pantoprazole (PROTONIX) 40 MG tablet TAKE 1 TABLET(40 MG) BY MOUTH DAILY 90  tablet 1  . tamsulosin (FLOMAX) 0.4 MG CAPS capsule TAKE 2 CAPSULES(0.8 MG) BY MOUTH DAILY 60 capsule 2  . valsartan (DIOVAN) 80 MG tablet TAKE 1 TABLET(80 MG) BY MOUTH DAILY 30 tablet 1   No current facility-administered medications on file prior to visit.   Health Maintenance  Topic Date Due  . Zoster (Shingles) Vaccine (1 of 2) Never done  . COVID-19 Vaccine (1 - 2024-25 season) Never done  . Pneumonia Vaccine (1 of 1 - PCV) Never done  . Flu Shot  01/20/2024*  . DTaP/Tdap/Td vaccine (2 - Td or Tdap) 05/20/2028  . Colon Cancer Screening  03/04/2030  . Hepatitis C Screening  Completed  . HIV Screening  Completed  . HPV Vaccine  Aged Out  *Topic was postponed. The date shown is not the original due date.    Objective:   There were no vitals filed for this visit. {Vitals History (Optional):23777}  Physical Exam  {Labs (Optional):23779}  Assessment & Plan   There are no diagnoses linked to this encounter.   Patient have been counseled extensively about nutrition and exercise. Other issues discussed during this visit include: low cholesterol diet, weight control and daily exercise, foot care, annual eye examinations at Ophthalmology, importance of adherence with medications and regular follow-up.  We also discussed long term complications of uncontrolled diabetes and hypertension.   No follow-ups on file.  The patient was given clear instructions to go to ER or return to medical center if symptoms don't improve, worsen or new problems develop. The patient verbalized understanding. The patient was told to call to get lab results if they haven't heard anything in the next week.   This note has been created with Education officer, environmental. Any transcriptional errors are unintentional.   Grayce Sessions, NP 12/31/2023, 4:07 PM

## 2023-12-31 NOTE — Telephone Encounter (Signed)
 Copied from CRM [id number]. Topic: Clinical - Red Word Triage >> Dec 31, 2023  9:14 AM Manuel Brooks wrote: Red Word that prompted transfer to Nurse Triage: Patient had hot water splashed in his face from car. Sprayed in eyes. callback number is [phone]  Chief Complaint: Eye injury Symptoms: Mild pain, swelling Frequency: Since last night Pertinent Negatives: Patient denies redness Disposition: [] ED /[] Urgent Care (no appt availability in office) / [x] Appointment(In office/virtual)/ []  [facility]/ [] Home Care/ [] Refused Recommended Disposition /[] [facility]/ []  Follow-up with PCP Additional Notes: Patient called in to report an eye injury that occurred last night. Patient stated he was working on his car and hot, dirty water sprayed into his eyes. Patient stated the water did not contain chemicals. Patient stated he rinsed his eyes out with water immediately following the accident. Patient stated he can see out of both eyes. Patient stated the left eye is completely normal at this time. Patient stated the right eye is slightly swollen and he has to open the eye slowly. Patient denied redness and drainage at this time. This RN advised patient to see a provider within 24 hours, per protocol. No availability with PCP. This RN scheduled same day appointment at an alternat office. This RN advised patient to call back if symptoms worsen. Patient complied.   Reason for Disposition  Eyelids swollen shut    Eyelid is not completely swollen shut. Patient stated right eye is swollen and he can open it slowly.  Answer Assessment - Initial Assessment Questions 1. TYPE OF FOREIGN BODY: "What got in the eye?"      Hot dirty water 2. ONSET: "When did it happen?"      Last night  3. MECHANISM: "How did it happen?"      States he was working on his car and hot water sprayed into his eyes, denies chemicals mixed in with water 4. VISION: "Do you have blurred vision?"      States right eye is  swollen and left eye is normal, states he can still see out of both eyes  5. PAIN: "Is it painful?" If Yes, ask: "How bad is the pain?"  (Scale 1-10; or mild, moderate, severe)     States he has minimal pain in right eye, pain is gone from left eye 6. CONTACTS: "Do you wear contacts?"     Glasses 7. OTHER SYMPTOMS: "Do you have any other symptoms?"     States he has to open right eye slowly, states both eyes were dripping/watering last night, but dripping has stopped, denies redness  Protocols used: Eye - Foreign Body-A-AH, Eye Injury-A-AH

## 2024-01-03 ENCOUNTER — Encounter (INDEPENDENT_AMBULATORY_CARE_PROVIDER_SITE_OTHER): Payer: Self-pay | Admitting: Primary Care

## 2024-01-08 ENCOUNTER — Other Ambulatory Visit: Payer: Self-pay

## 2024-01-08 ENCOUNTER — Other Ambulatory Visit: Payer: Self-pay | Admitting: Nurse Practitioner

## 2024-01-08 DIAGNOSIS — E782 Mixed hyperlipidemia: Secondary | ICD-10-CM

## 2024-01-08 MED ORDER — ATORVASTATIN CALCIUM 10 MG PO TABS
10.0000 mg | ORAL_TABLET | Freq: Every day | ORAL | 1 refills | Status: DC
  Filled 2024-01-08: qty 30, 30d supply, fill #0

## 2024-01-08 NOTE — Telephone Encounter (Signed)
 Copied from CRM [id number]. Topic: Clinical - Medication Refill >> Jan 08, 2024 11:24 AM Shelah Lewandowsky wrote: Most Recent Primary Care Visit:  Provider: Grayce Sessions  Department: [facility]  Visit Type: ACUTE  Date: 12/31/2023  Medication: atorvastatin (LIPITOR) 10 MG tablet valsartan (DIOVAN) 80 MG tablet   Has the patient contacted their pharmacy? No (Agent: If no, request that the patient contact the pharmacy for the refill. If patient does not wish to contact the pharmacy document the reason why and proceed with request.) (Agent: If yes, when and what did the pharmacy advise?)  Is this the correct pharmacy for this prescription? Yes If no, delete pharmacy and type the correct one.  This is the patient's preferred pharmacy:    [facility] [phone] - [place], [place] - [address] AT [facility] [address] [place] [address] Phone: [phone] Fax: [phone]   Has the prescription been filled recently? Yes  Is the patient out of the medication? Yes  Has the patient been seen for an appointment in the last year OR does the patient have an upcoming appointment? Yes  Can we respond through MyChart? No  Agent: Please be advised that Rx refills may take up to 3 business days. We ask that you follow-up with your pharmacy.

## 2024-01-11 ENCOUNTER — Other Ambulatory Visit (HOSPITAL_COMMUNITY): Payer: Self-pay

## 2024-01-20 ENCOUNTER — Other Ambulatory Visit: Payer: Self-pay

## 2024-02-16 ENCOUNTER — Other Ambulatory Visit: Payer: Self-pay | Admitting: Nurse Practitioner

## 2024-03-03 ENCOUNTER — Other Ambulatory Visit: Payer: Self-pay | Admitting: Nurse Practitioner

## 2024-03-03 DIAGNOSIS — E782 Mixed hyperlipidemia: Secondary | ICD-10-CM

## 2024-03-12 ENCOUNTER — Encounter: Payer: Self-pay | Admitting: Nurse Practitioner

## 2024-03-12 ENCOUNTER — Ambulatory Visit (INDEPENDENT_AMBULATORY_CARE_PROVIDER_SITE_OTHER): Payer: Self-pay | Admitting: Nurse Practitioner

## 2024-03-12 VITALS — BP 137/73 | HR 67 | Temp 97.7°F | Wt 183.8 lb

## 2024-03-12 DIAGNOSIS — I10 Essential (primary) hypertension: Secondary | ICD-10-CM

## 2024-03-12 DIAGNOSIS — Z13220 Encounter for screening for lipoid disorders: Secondary | ICD-10-CM | POA: Diagnosis not present

## 2024-03-12 DIAGNOSIS — Z1329 Encounter for screening for other suspected endocrine disorder: Secondary | ICD-10-CM

## 2024-03-12 NOTE — Patient Instructions (Addendum)
 1. Essential hypertension (Primary)  - CBC - Comprehensive metabolic panel with GFR   2. Lipid screening  - Lipid Panel   3. Thyroid disorder screen  - TSH

## 2024-03-12 NOTE — Progress Notes (Signed)
 Subjective   Patient ID: Manuel Brooks, male    DOB: [birth year], 63 y.o.   MRN: [id number]  Chief Complaint  Patient presents with   Medical Management of Chronic Issues    Referring provider: Jerrlyn Morel, NP  Manuel Brooks is a 63 y.o. male with Past Medical History: No date: Allergy 01/2019: CAP (community acquired pneumonia) 10/2019: Elevated liver enzymes 04/2019: Exertional shortness of breath No date: GERD (gastroesophageal reflux disease) 03/2020: H. pylori infection 10/2019: Hyperlipidemia No date: Hypertension No date: Leucopenia 11/2019: Vitamin D deficiency   HPI  Hypertension   Patient is here for follow-up of elevated blood pressure. He is not exercising and is adherent to a low-salt diet. Blood pressure is not monitored at home. Cardiac symptoms: none. Patient denies chest pain, dyspnea, fatigue, lower extremity edema, palpitations, and syncope. Cardiovascular risk factors: dyslipidemia, hypertension, male gender, and obesity (BMI >= 30 kg/m2). Use of agents associated with hypertension: none. History of target organ damage: none.     Dyslipidemia     Patient presents for evaluation of lipids. Compliance with treatment thus far has been good. A repeat fasting lipid profile was done. The patient does not use medications that may worsen dyslipidemias (corticosteroids, progestins, anabolic steroids, diuretics, beta-blockers, amiodarone, cyclosporine, olanzapine). The patient exercises occasionally. Lipid panel at last visit was normal. Will not need recheck today.   Note: patient has followed with GI and urology    No Known Allergies  Immunization History  Administered Date(s) Administered   Influenza,inj,Quad PF,6+ Mos 07/22/2014, 08/02/2016, 10/10/2018, 09/12/2020, 09/07/2022   Tdap 05/20/2018    Tobacco History: Social History   Tobacco Use  Smoking Status Former   Current packs/day: 0.00   Average packs/day: 0.5 packs/day for 10.0 years (5.0 ttl pk-yrs)    Types: Cigarettes   Start date: 10/22/1988   Quit date: 10/22/1998   Years since quitting: 25.4  Smokeless Tobacco Never   Counseling given: Not Answered   Outpatient Encounter Medications as of 03/12/2024  Medication Sig   ALLERGY 4 MG tablet TAKE 1 TABLET(4 MG) BY MOUTH EVERY 6 HOURS AS NEEDED FOR COUGH   atorvastatin  (LIPITOR) 10 MG tablet TAKE 1 TABLET(10 MG) BY MOUTH DAILY   hydrocortisone  (ANUSOL -HC) 2.5 % rectal cream APPLY RECTALLY TO THE AFFECTED AREA TWICE DAILY   montelukast  (SINGULAIR ) 10 MG tablet TAKE 1 TABLET(10 MG) BY MOUTH AT BEDTIME   ondansetron  (ZOFRAN ) 4 MG tablet Take 1 tablet (4 mg total) by mouth every 8 (eight) hours as needed for nausea or vomiting.   pantoprazole  (PROTONIX ) 40 MG tablet TAKE 1 TABLET(40 MG) BY MOUTH DAILY   tamsulosin  (FLOMAX ) 0.4 MG CAPS capsule TAKE 2 CAPSULES(0.8 MG) BY MOUTH DAILY   valsartan  (DIOVAN ) 80 MG tablet TAKE 1 TABLET(80 MG) BY MOUTH DAILY   famotidine  (PEPCID ) 40 MG tablet Take 1 tablet (40 mg total) by mouth daily.   fluticasone  (FLONASE ) 50 MCG/ACT nasal spray SHAKE LIQUID AND USE 2 SPRAYS IN EACH NOSTRIL DAILY (Patient not taking: Reported on 03/12/2024)   hydrocortisone  cream 1 % Apply 1 Application topically 2 (two) times daily.   No facility-administered encounter medications on file as of 03/12/2024.    Review of Systems  Review of Systems  Constitutional: Negative.   HENT: Negative.    Cardiovascular: Negative.   Gastrointestinal: Negative.   Allergic/Immunologic: Negative.   Neurological: Negative.   Psychiatric/Behavioral: Negative.       Objective:   BP 137/73   Pulse 67   Temp 97.7 F (  36.5 C) (Oral)   Wt 183 lb 12.8 oz (83.4 kg)   SpO2 98%   BMI 30.59 kg/m   Wt Readings from Last 5 Encounters:  03/12/24 183 lb 12.8 oz (83.4 kg)  12/31/23 184 lb 3.2 oz (83.6 kg)  09/13/23 190 lb (86.2 kg)  03/13/23 177 lb 6.4 oz (80.5 kg)  03/05/23 180 lb (81.6 kg)     Physical Exam Vitals and nursing note  reviewed.  Constitutional:      General: He is not in acute distress.    Appearance: He is well-developed.  Cardiovascular:     Rate and Rhythm: Normal rate and regular rhythm.  Pulmonary:     Effort: Pulmonary effort is normal.     Breath sounds: Normal breath sounds.  Skin:    General: Skin is warm and dry.  Neurological:     Mental Status: He is alert and oriented to person, place, and time.       Assessment & Plan:   Essential hypertension -     CBC -     Comprehensive metabolic panel with GFR  Lipid screening -     Lipid panel  Thyroid disorder screen -     TSH     Return in about 6 months (around 09/12/2024).   Jerrlyn Morel, NP 03/12/2024

## 2024-03-13 ENCOUNTER — Ambulatory Visit: Payer: Self-pay | Admitting: Nurse Practitioner

## 2024-03-13 LAB — LIPID PANEL
Chol/HDL Ratio: 2.8 ratio (ref 0.0–5.0)
Cholesterol, Total: 134 mg/dL (ref 100–199)
HDL: 48 mg/dL (ref >39)
LDL Chol Calc (NIH): 70 mg/dL (ref 0–99)
Triglycerides: 84 mg/dL (ref 0–149)
VLDL Cholesterol Cal: 16 mg/dL (ref 5–40)

## 2024-03-13 LAB — CBC
Hematocrit: 45.5 % (ref 37.5–51.0)
Hemoglobin: 14.5 g/dL (ref 13.0–17.7)
MCH: 26.7 pg (ref 26.6–33.0)
MCHC: 31.9 g/dL (ref 31.5–35.7)
MCV: 84 fL (ref 79–97)
Platelets: 262 10*3/uL (ref 150–450)
RBC: 5.44 x10E6/uL (ref 4.14–5.80)
RDW: 13.9 % (ref 11.6–15.4)
WBC: 3.6 10*3/uL (ref 3.4–10.8)

## 2024-03-13 LAB — COMPREHENSIVE METABOLIC PANEL WITH GFR
ALT: 18 IU/L (ref 0–44)
AST: 25 IU/L (ref 0–40)
Albumin: 4.2 g/dL (ref 3.9–4.9)
Alkaline Phosphatase: 86 IU/L (ref 44–121)
BUN/Creatinine Ratio: 20 (ref 10–24)
BUN: 21 mg/dL (ref 8–27)
Bilirubin Total: 0.3 mg/dL (ref 0.0–1.2)
CO2: 25 mmol/L (ref 20–29)
Calcium: 9.2 mg/dL (ref 8.6–10.2)
Chloride: 101 mmol/L (ref 96–106)
Creatinine, Ser: 1.06 mg/dL (ref 0.76–1.27)
Globulin, Total: 2.9 g/dL (ref 1.5–4.5)
Glucose: 90 mg/dL (ref 70–99)
Potassium: 4.4 mmol/L (ref 3.5–5.2)
Sodium: 138 mmol/L (ref 134–144)
Total Protein: 7.1 g/dL (ref 6.0–8.5)
eGFR: 78 mL/min/{1.73_m2} (ref >59)

## 2024-03-13 LAB — TSH: TSH: 1.63 u[IU]/mL (ref 0.450–4.500)

## 2024-03-31 ENCOUNTER — Other Ambulatory Visit: Payer: Self-pay | Admitting: Nurse Practitioner

## 2024-03-31 DIAGNOSIS — J302 Other seasonal allergic rhinitis: Secondary | ICD-10-CM

## 2024-05-28 ENCOUNTER — Other Ambulatory Visit: Payer: Self-pay | Admitting: Nurse Practitioner

## 2024-05-28 DIAGNOSIS — Z889 Allergy status to unspecified drugs, medicaments and biological substances status: Secondary | ICD-10-CM

## 2024-06-11 ENCOUNTER — Other Ambulatory Visit: Payer: Self-pay | Admitting: Nurse Practitioner

## 2024-06-11 DIAGNOSIS — E782 Mixed hyperlipidemia: Secondary | ICD-10-CM

## 2024-06-11 MED ORDER — ATORVASTATIN CALCIUM 10 MG PO TABS: 10.0000 mg | ORAL_TABLET | Freq: Every day | ORAL | 1 refills | Status: AC

## 2024-06-11 NOTE — Telephone Encounter (Signed)
 Copied from CRM [id number]. Topic: Clinical - Medication Refill >> Jun 11, 2024  9:27 AM Berwyn MATSU wrote: Medication: atorvastatin  (LIPITOR) 10 MG tablet  Has the patient contacted their pharmacy? Yes (Agent: If no, request that the patient contact the pharmacy for the refill. If patient does not wish to contact the pharmacy document the reason why and proceed with request.) (Agent: If yes, when and what did the pharmacy advise?)  This is the patient's preferred pharmacy:    [facility] [phone] - [place], [place] - [address] AT [facility] OF [address] [place] [address] Phone: [phone] Fax: [phone]  Is this the correct pharmacy for this prescription? Yes If no, delete pharmacy and type the correct one.   Has the prescription been filled recently? Yes  Is the patient out of the medication? Yes  Has the patient been seen for an appointment in the last year OR does the patient have an upcoming appointment? Yes  Can we respond through MyChart? No  Agent: Please be advised that Rx refills may take up to 3 business days. We ask that you follow-up with your pharmacy.

## 2024-06-25 ENCOUNTER — Other Ambulatory Visit: Payer: Self-pay

## 2024-06-25 ENCOUNTER — Telehealth: Payer: Self-pay | Admitting: Nurse Practitioner

## 2024-06-25 MED ORDER — CIPROFLOXACIN-DEXAMETHASONE 0.3-0.1 % OT SUSP
4.0000 [drp] | Freq: Two times a day (BID) | OTIC | 0 refills | Status: AC
  Filled 2024-06-25: qty 7.5, 10d supply, fill #0

## 2024-06-25 NOTE — Telephone Encounter (Signed)
 Sent to PCP. Kh

## 2024-06-25 NOTE — Telephone Encounter (Unsigned)
 Copied from CRM [id number]. Topic: Clinical - Medication Refill >> Jun 25, 2024 12:24 PM Zebedee SAUNDERS wrote: Medication: Ciprodex  4 drops in ear.   Has the patient contacted their pharmacy? Yes (Agent: If no, request that the patient contact the pharmacy for the refill. If patient does not wish to contact the pharmacy document the reason why and proceed with request.) (Agent: If yes, when and what did the pharmacy advise?)Pharmacy need script.   This is the patient's preferred pharmacy:   [facility] [phone] - [place], [place] - [address] AT [facility] OF [address] [place] [address] Phone: [phone] Fax: [phone]  Is this the correct pharmacy for this prescription? Yes If no, delete pharmacy and type the correct one.   Has the prescription been filled recently? Yes  Is the patient out of the medication? Yes  Has the patient been seen for an appointment in the last year OR does the patient have an upcoming appointment? Yes  Can we respond through MyChart? No  Agent: Please be advised that Rx refills may take up to 3 business days. We ask that you follow-up with your pharmacy.

## 2024-06-25 NOTE — Telephone Encounter (Signed)
 Per pt request. It may have been filled in the past and dropped off the list. [facility]

## 2024-07-01 ENCOUNTER — Other Ambulatory Visit: Payer: Self-pay

## 2024-07-30 ENCOUNTER — Other Ambulatory Visit: Payer: Self-pay

## 2024-07-30 ENCOUNTER — Emergency Department (HOSPITAL_COMMUNITY)

## 2024-07-30 ENCOUNTER — Encounter (HOSPITAL_COMMUNITY): Payer: Self-pay

## 2024-07-30 ENCOUNTER — Emergency Department (HOSPITAL_COMMUNITY)
Admission: EM | Admit: 2024-07-30 | Discharge: 2024-07-31 | Disposition: A | Discharge status: Home / Self Care | Attending: Emergency Medicine | Admitting: Emergency Medicine

## 2024-07-30 DIAGNOSIS — I10 Essential (primary) hypertension: Secondary | ICD-10-CM | POA: Diagnosis not present

## 2024-07-30 DIAGNOSIS — S62634B Displaced fracture of distal phalanx of right ring finger, initial encounter for open fracture: Secondary | ICD-10-CM | POA: Diagnosis not present

## 2024-07-30 DIAGNOSIS — S6991XA Unspecified injury of right wrist, hand and finger(s), initial encounter: Secondary | ICD-10-CM | POA: Diagnosis present

## 2024-07-30 DIAGNOSIS — S62639B Displaced fracture of distal phalanx of unspecified finger, initial encounter for open fracture: Secondary | ICD-10-CM

## 2024-07-30 DIAGNOSIS — W228XXA Striking against or struck by other objects, initial encounter: Secondary | ICD-10-CM | POA: Diagnosis not present

## 2024-07-30 DIAGNOSIS — Z79899 Other long term (current) drug therapy: Secondary | ICD-10-CM | POA: Insufficient documentation

## 2024-07-30 LAB — I-STAT CHEM 8, ED
BUN: 20 mg/dL (ref 8–23)
Calcium, Ion: 1.17 mmol/L (ref 1.15–1.40)
Chloride: 104 mmol/L (ref 98–111)
Creatinine, Ser: 1.1 mg/dL (ref 0.61–1.24)
Glucose, Bld: 88 mg/dL (ref 70–99)
HCT: 43 % (ref 39.0–52.0)
Hemoglobin: 14.6 g/dL (ref 13.0–17.0)
Potassium: 3.9 mmol/L (ref 3.5–5.1)
Sodium: 141 mmol/L (ref 135–145)
TCO2: 26 mmol/L (ref 22–32)

## 2024-07-30 MED ORDER — LIDOCAINE HCL (PF) 1 % IJ SOLN
10.0000 mL | Freq: Once | INTRAMUSCULAR | Status: AC
  Administered 2024-07-30: 10 mL
  Filled 2024-07-30: qty 30

## 2024-07-30 MED ORDER — CEFAZOLIN SODIUM-DEXTROSE 2-4 GM/100ML-% IV SOLN
2.0000 g | Freq: Once | INTRAVENOUS | Status: AC
  Administered 2024-07-30: 2 g via INTRAVENOUS
  Filled 2024-07-30: qty 100

## 2024-07-30 NOTE — ED Notes (Signed)
Pt soaking hand in betadine.

## 2024-07-30 NOTE — ED Triage Notes (Addendum)
 Right hand, fourth finger has fingernail avulsion. Tile hit finger when placing tiles. Pt states tetanus has been updated in the last 10 years he thinks

## 2024-07-31 MED ORDER — DOXYCYCLINE HYCLATE 100 MG PO CAPS: 100.0000 mg | ORAL_CAPSULE | Freq: Two times a day (BID) | ORAL | 0 refills | Status: AC

## 2024-07-31 MED ORDER — OXYCODONE-ACETAMINOPHEN 5-325 MG PO TABS: 1.0000 | ORAL_TABLET | Freq: Four times a day (QID) | ORAL | 0 refills | Status: AC | PRN

## 2024-07-31 NOTE — Discharge Instructions (Addendum)
 You were seen in the ER today for evaluation of your finger. You have an open fracture which puts you at high risk for infection. Please make sure that you keep the finger clean and out of contamination. Please make sure that you are remembering to keep your wound clean daily with Dial soap and water and daily bandage changes. I recommend keeping the wound covered for the next 48-72 hours and then to your comfort afterwards. Do not expose the wound to any dishwater, pools, lakes, oceans, [employer], dirt or grime. Keeping the wound clean and away from contamination can help ensure good wound healing and help to prevent infections. You NEED to follow up with a hand surgeon. Please call first thing in the morning as they may be able to get you in. This will need surgery. Again, it is VERY important that you keep the area clean and follow up with the hand surgeon. If you have any concerns, new or worsening symptoms, please return to the nearest ER for re-evaluation.   Contact a doctor if: You got a tetanus shot and you have any of these problems where the needle went in: Swelling. Very bad pain. Redness. Bleeding. A wound that was closed breaks open. You have a fever. You have any of these signs of infection in your wound: More redness, swelling, or pain. Fluid or blood. Warmth. Pus or a bad smell. You see something coming out of the wound, such as wood or glass. Medicine does not make your pain go away. You notice a change in the color of your skin near your wound. You need to change the bandage often. You have a new rash. You lose feeling (have numbness) around the wound. Get help right away if: You have very bad swelling around the wound. Your pain suddenly gets worse and is very bad. You have painful lumps near the wound or on skin anywhere on your body. You have a red streak going away from your wound. The wound is on your hand or foot, and: You cannot move a finger or toe. Your fingers  or toes look pale or bluish.

## 2024-07-31 NOTE — ED Provider Notes (Signed)
 [place] EMERGENCY DEPARTMENT AT [facility] Provider Note   CSN: [id number] Arrival date & time: 07/30/24  1919     Patient presents with: Finger Injury   Manuel Brooks is a 63 y.o. male with history of hypertension, hyperlipidemia presents emerged on today for evaluation of right fourth finger injury.  Patient reports that he was fixing his car when he was moving the tire and the resistance stopped causing his hand to slide and hitting the car.  He is right-hand dominant.  Denies any other injuries.  Denies any allergies to any medications.  Reports his tetanus is updated within the past 10 years.  HPI     Prior to Admission medications   Medication Sig Start Date End Date Taking? Authorizing Provider  ALLERGY 4 MG tablet TAKE 1 TABLET(4 MG) BY MOUTH EVERY 6 HOURS AS NEEDED FOR COUGH 09/16/23   Cobb, Comer GAILS, NP  atorvastatin  (LIPITOR) 10 MG tablet Take 1 tablet (10 mg total) by mouth daily. 06/11/24   Nichols, Tonya S, NP  ciprofloxacin -dexamethasone  (CIPRODEX ) OTIC suspension Place 4 drops into both ears 2 (two) times daily. 06/25/24   Oley Bascom RAMAN, NP  famotidine  (PEPCID ) 40 MG tablet Take 1 tablet (40 mg total) by mouth daily. 12/28/22 03/28/23  Oley Bascom RAMAN, NP  fluticasone  (FLONASE ) 50 MCG/ACT nasal spray SHAKE LIQUID AND USE 2 SPRAYS IN EACH NOSTRIL DAILY 05/28/24   Nichols, Tonya S, NP  hydrocortisone  (ANUSOL -HC) 2.5 % rectal cream APPLY RECTALLY TO THE AFFECTED AREA TWICE DAILY 04/17/23   Paseda, Folashade R, FNP  hydrocortisone  cream 1 % Apply 1 Application topically 2 (two) times daily. 09/13/23   Oley Bascom RAMAN, NP  montelukast  (SINGULAIR ) 10 MG tablet TAKE 1 TABLET(10 MG) BY MOUTH AT BEDTIME 03/31/24   Nichols, Tonya S, NP  ondansetron  (ZOFRAN ) 4 MG tablet Take 1 tablet (4 mg total) by mouth every 8 (eight) hours as needed for nausea or vomiting. 09/10/22   Oley Bascom RAMAN, NP  pantoprazole  (PROTONIX ) 40 MG tablet TAKE 1 TABLET(40 MG) BY MOUTH DAILY 11/26/23    Nichols, Tonya S, NP  tamsulosin  (FLOMAX ) 0.4 MG CAPS capsule TAKE 2 CAPSULES(0.8 MG) BY MOUTH DAILY 11/26/23   Nichols, Tonya S, NP  valsartan  (DIOVAN ) 80 MG tablet TAKE 1 TABLET(80 MG) BY MOUTH DAILY 02/17/24   Nichols, Tonya S, NP    Allergies: Patient has no known allergies.    Review of Systems  Skin:  Positive for wound.  See HPI  Updated Vital Signs BP (!) 155/101 (BP Location: Left Arm)   Pulse 61   Temp 98.6 F (37 C) (Oral)   Resp 18   Ht 5' 5 (1.651 m)   Wt 65 kg   SpO2 95%   BMI 23.85 kg/m   Physical Exam Vitals and nursing note reviewed.  Constitutional:      General: He is not in acute distress.    Appearance: He is not ill-appearing or toxic-appearing.  Eyes:     General: No scleral icterus. Pulmonary:     Effort: Pulmonary effort is normal. No respiratory distress.  Musculoskeletal:        General: Signs of injury present.     Comments: Please see image.  Nail intact in the fourth finger on the right hand.  Does appear to remove from the distal nailbed.  He is still able to flex and extend.  Brisk cap refill present.  Palpable radial pulses.  Compartments are soft.  No other tenderness to  the hand.  Does have a subungual hematoma to the third finger however nontender. Sensation intact.   Skin:    General: Skin is warm and dry.  Neurological:     Mental Status: He is alert.       (all labs ordered are listed, but only abnormal results are displayed) Labs Reviewed  I-STAT CHEM 8, ED    EKG: None  Radiology: DG Finger Ring Right Result Date: 07/30/2024 EXAM: 3 VIEW(S) XRAY OF THE RIGHT FINGER COMPARISON: None available. CLINICAL HISTORY: Post reduction. Post Reduction.; Per chart - Right hand, fourth finger has fingernail avulsion. Tile hit finger when placing tiles. Pt states tetanus has been updated in the last 10 years he thinks FINDINGS: BONES AND JOINTS: There is an acute, oblique extraarticular fracture of the distal phalanx of the right fourth  digit with mild volar angulation of the distal fracture fragment. The distal fracture fragment approaches the dorsal skin surface and there is a small soft tissue laceration in this location and an open fracture is not excluded. No dislocation. Joint spaces are preserved. SOFT TISSUES: There is a small soft tissue laceration in the location of the distal fracture fragment. IMPRESSION: 1. Acute, oblique extraarticular fracture of the distal phalanx of the right fourth digit with mild volar angulation of the distal fracture fragment, small soft tissue laceration, and possible open fracture. No significant change in alignment since prior examination of 8:09 pm. Electronically signed by: Dorethia Molt MD 07/30/2024 11:33 PM EDT RP Workstation: [id number]   DG Finger Ring Right Result Date: 07/30/2024 CLINICAL DATA:  Crush injury to the fourth digit, initial encounter EXAM: RIGHT RING FINGER 2+V COMPARISON:  None Available. FINDINGS: Transverse fracture is noted through the midportion of the fourth distal phalanx. Some mild angulation is noted at the fracture site. Associated soft tissue swelling is seen. No other focal abnormality is noted. IMPRESSION: Fourth distal phalangeal fracture with associated soft tissue irregularity. Electronically Signed   By: Oneil Devonshire M.D.   On: 07/30/2024 20:23   .Reduction of fracture  Date/Time: 08/01/2024 4:19 PM  Performed by: Bernis Ernst, PA-C Authorized by: Bernis Ernst, PA-C  Consent: Verbal consent obtained Risks and benefits: risks, benefits and alternatives were discussed Consent given by: patient (Translator used) Patient understanding: patient states understanding of the procedure being performed Imaging studies: imaging studies available Patient identity confirmed: verbally with patient Local anesthesia used: yes Anesthesia: digital block  Anesthesia: Local anesthesia used: yes Local Anesthetic: lidocaine  1% without epinephrine Anesthetic total: 6  mL Comments: Wound was thoroughly cleansed and irrigated with Betadine and normal saline.  Digital block was placed using the ring and method.  Anesthesia achieved.  Reduction difficult due to free-floating nail and that there is no skin to hold the fracture/nail in place.  Did feel some bone movement will obtain postreduction x-ray however unsure of the success.  Have consulted hand surgery.      Medications Ordered in the ED  ceFAZolin (ANCEF) IVPB 2g/100 mL premix (0 g Intravenous Stopped 07/30/24 2308)  lidocaine  (PF) (XYLOCAINE ) 1 % injection 10 mL (10 mLs Other Given 07/30/24 2155)    Clinical Course as of 08/01/24 1618  Fri Jul 31, 2024  0020 Spoke with Dr. Murrell on the phone who offered to come in to pin the patient's finger, however he did not come with anybody. I discussed that doing the surgery tonight would be optimal, however the patient reports that he couldn't arrange anyone to pick him up, but may be  able to Friday or Monday. He is very anxious to get home. Dr. Murrell reports that him or one of his PAs can likely see the patient tomorrow or Monday.  [RR]    Clinical Course User Index [RR] Bernis Ernst, PA-C                               Medical Decision Making Amount and/or Complexity of Data Reviewed Radiology: ordered.  Risk Prescription drug management.   62 y.o. male presents to the ER for evaluation of laceration and pain to the fourth right finger. Differential diagnosis includes but is not limited to fracture, foreign body, open fracture. Vital signs elevated blood pressure. Physical exam as noted above.   Patient was brought in from the waiting room after wait time.  Concern for open fracture so was given Ancef.  I have ordered an i-STAT Chem-8 as well.  Wound was cleansed with Betadine and normal saline and irrigated with normal saline as well.  This tetanus was updated in 2019 from previous chart reviewed.  I independently reviewed and interpreted the patient's  labs.  I-STAT Chem-8 unremarkable.  X-ray of the right ring finger showsFourth distal phalangeal fracture with associated soft tissue irregularity.  Attempted reduction however patient has a free-floating nail edge that was unable to be reduced given that there is no skin to hold this nail edge in place.  I did feel some bone movement so postreduction x-ray was obtained.  Postreduction x-ray shows acute, oblique extraarticular fracture of the distal phalanx of the right fourth digit with mild volar angulation of the distal fracture fragment, small soft tissue laceration, and possible open fracture. No significant change in alignment since prior examination. Per radiologist's interpretation.     I consulted hand surgery and spoke with Dr. Murrell.  Please see clinical course above.  Patient was given Xeroform dressing with gauze and finger splint.  I will send him home with some doxycycline  as well as some pain medication as well.  Discharge instructions were being typed up and printed to be further discussed with patient however it was discovered that he had eloped prior to this.  Portions of this report may have been transcribed using voice recognition software. Every effort was made to ensure accuracy; however, inadvertent computerized transcription errors may be present.    Final diagnoses:  Open fracture of tuft of distal phalanx of finger    ED Discharge Orders          Ordered    doxycycline  (VIBRAMYCIN ) 100 MG capsule  2 times daily        07/31/24 0041    oxyCODONE-acetaminophen (PERCOCET/ROXICET) 5-325 MG tablet  Every 6 hours PRN        07/31/24 0041               Bernis Ernst, PA-C 08/01/24 1620    Franklyn Sid SAILOR, MD 08/06/24 [phone]

## 2024-09-09 ENCOUNTER — Other Ambulatory Visit: Payer: Self-pay | Admitting: Nurse Practitioner

## 2024-09-14 ENCOUNTER — Encounter: Payer: Self-pay | Admitting: Nurse Practitioner

## 2024-09-14 ENCOUNTER — Ambulatory Visit (INDEPENDENT_AMBULATORY_CARE_PROVIDER_SITE_OTHER): Payer: Self-pay | Admitting: Nurse Practitioner

## 2024-09-14 VITALS — BP 139/86 | HR 64 | Wt 164.0 lb

## 2024-09-14 DIAGNOSIS — E782 Mixed hyperlipidemia: Secondary | ICD-10-CM | POA: Diagnosis not present

## 2024-09-14 DIAGNOSIS — I10 Essential (primary) hypertension: Secondary | ICD-10-CM

## 2024-09-14 DIAGNOSIS — H9193 Unspecified hearing loss, bilateral: Secondary | ICD-10-CM

## 2024-09-14 NOTE — Progress Notes (Signed)
 Subjective   Patient ID: Manuel Brooks, male    DOB: [birth year], 63 y.o.   MRN: [id number]  Chief Complaint  Patient presents with   Annual Exam   Hypertension    Referring provider: Oley Manuel RAMAN, NP  Manuel Brooks is a 63 y.o. male with Past Medical History: No date: Allergy 01/2019: CAP (community acquired pneumonia) 10/2019: Elevated liver enzymes 04/2019: Exertional shortness of breath No date: GERD (gastroesophageal reflux disease) 03/2020: H. pylori infection 10/2019: Hyperlipidemia No date: Hypertension No date: Leucopenia 11/2019: Vitamin D  deficiency   HPI  Hypertension   Patient is here for follow-up of elevated blood pressure. He is not exercising and is adherent to a low-salt diet. Blood pressure is not monitored at home. Cardiac symptoms: none. Patient denies chest pain, dyspnea, fatigue, lower extremity edema, palpitations, and syncope. Cardiovascular risk factors: dyslipidemia, hypertension, male gender, and obesity (BMI >= 30 kg/m2). Use of agents associated with hypertension: none. History of target organ damage: none.     Dyslipidemia     Patient presents for evaluation of lipids. Compliance with treatment thus far has been good. A repeat fasting lipid profile was done. The patient does not use medications that may worsen dyslipidemias (corticosteroids, progestins, anabolic steroids, diuretics, beta-blockers, amiodarone, cyclosporine, olanzapine). The patient exercises occasionally. Lipid panel at last visit was normal. Will not need recheck today.   Note: patient has followed with GI and urology  Patient is requesting a referral for audiology for decreased hearing to both ears.  Will place referral today.   No Known Allergies  Immunization History  Administered Date(s) Administered   Influenza,inj,Quad PF,6+ Mos 07/22/2014, 08/02/2016, 10/10/2018, 09/12/2020, 09/07/2022   Tdap 05/20/2018    Tobacco History: Social History   Tobacco Use  Smoking  Status Former   Current packs/day: 0.00   Average packs/day: 0.5 packs/day for 10.0 years (5.0 ttl pk-yrs)   Types: Cigarettes   Start date: 10/22/1988   Quit date: 10/22/1998   Years since quitting: 25.9  Smokeless Tobacco Never   Counseling given: Not Answered   Outpatient Encounter Medications as of 09/14/2024  Medication Sig   ALLERGY 4 MG tablet TAKE 1 TABLET(4 MG) BY MOUTH EVERY 6 HOURS AS NEEDED FOR COUGH   atorvastatin  (LIPITOR) 10 MG tablet Take 1 tablet (10 mg total) by mouth daily.   ciprofloxacin -dexamethasone  (CIPRODEX ) OTIC suspension Place 4 drops into both ears 2 (two) times daily.   doxycycline  (VIBRAMYCIN ) 100 MG capsule Take 1 capsule (100 mg total) by mouth 2 (two) times daily.   famotidine  (PEPCID ) 40 MG tablet Take 1 tablet (40 mg total) by mouth daily.   fluticasone  (FLONASE ) 50 MCG/ACT nasal spray SHAKE LIQUID AND USE 2 SPRAYS IN EACH NOSTRIL DAILY   hydrocortisone  (ANUSOL -HC) 2.5 % rectal cream APPLY RECTALLY TO THE AFFECTED AREA TWICE DAILY   hydrocortisone  cream 1 % Apply 1 Application topically 2 (two) times daily.   montelukast  (SINGULAIR ) 10 MG tablet TAKE 1 TABLET(10 MG) BY MOUTH AT BEDTIME   ondansetron  (ZOFRAN ) 4 MG tablet Take 1 tablet (4 mg total) by mouth every 8 (eight) hours as needed for nausea or vomiting.   oxyCODONE -acetaminophen  (PERCOCET/ROXICET) 5-325 MG tablet Take 1 tablet by mouth every 6 (six) hours as needed for severe pain (pain score 7-10).   pantoprazole  (PROTONIX ) 40 MG tablet TAKE 1 TABLET(40 MG) BY MOUTH DAILY   tamsulosin  (FLOMAX ) 0.4 MG CAPS capsule TAKE 2 CAPSULES(0.8 MG) BY MOUTH DAILY   valsartan  (DIOVAN ) 80 MG tablet TAKE 1 TABLET(80  MG) BY MOUTH DAILY   No facility-administered encounter medications on file as of 09/14/2024.    Review of Systems  Review of Systems  Constitutional: Negative.   HENT: Negative.    Cardiovascular: Negative.   Gastrointestinal: Negative.   Allergic/Immunologic: Negative.   Neurological:  Negative.   Psychiatric/Behavioral: Negative.       Objective:   BP 139/86 (BP Location: Left Arm, Patient Position: Sitting, Cuff Size: Large)   Pulse 64   Wt 164 lb (74.4 kg)   SpO2 97%   BMI 27.29 kg/m   Wt Readings from Last 5 Encounters:  09/14/24 164 lb (74.4 kg)  07/30/24 143 lb 4.8 oz (65 kg)  03/12/24 183 lb 12.8 oz (83.4 kg)  12/31/23 184 lb 3.2 oz (83.6 kg)  09/13/23 190 lb (86.2 kg)     Physical Exam Vitals and nursing note reviewed.  Constitutional:      General: He is not in acute distress.    Appearance: He is well-developed.  Cardiovascular:     Rate and Rhythm: Normal rate and regular rhythm.  Pulmonary:     Effort: Pulmonary effort is normal.     Breath sounds: Normal breath sounds.  Skin:    General: Skin is warm and dry.  Neurological:     Mental Status: He is alert and oriented to person, place, and time.       Assessment & Plan:   Decreased hearing of both ears -     Ambulatory referral to Audiology  Mixed hyperlipidemia -     CBC -     Comprehensive metabolic panel with GFR  Essential hypertension -     CBC -     Comprehensive metabolic panel with GFR     Return in about 6 months (around 03/14/2025).   Manuel GORMAN Borer, NP 09/14/2024

## 2024-09-15 LAB — COMPREHENSIVE METABOLIC PANEL WITH GFR
ALT: 18 IU/L (ref 0–44)
AST: 24 IU/L (ref 0–40)
Albumin: 4.2 g/dL (ref 3.9–4.9)
Alkaline Phosphatase: 84 IU/L (ref 47–123)
BUN/Creatinine Ratio: 12 (ref 10–24)
BUN: 13 mg/dL (ref 8–27)
Bilirubin Total: 0.4 mg/dL (ref 0.0–1.2)
CO2: 25 mmol/L (ref 20–29)
Calcium: 8.9 mg/dL (ref 8.6–10.2)
Chloride: 101 mmol/L (ref 96–106)
Creatinine, Ser: 1.07 mg/dL (ref 0.76–1.27)
Globulin, Total: 2.7 g/dL (ref 1.5–4.5)
Glucose: 93 mg/dL (ref 70–99)
Potassium: 4.2 mmol/L (ref 3.5–5.2)
Sodium: 139 mmol/L (ref 134–144)
Total Protein: 6.9 g/dL (ref 6.0–8.5)
eGFR: 77 mL/min/1.73 (ref >59)

## 2024-09-15 LAB — CBC
Hematocrit: 48.2 % (ref 37.5–51.0)
Hemoglobin: 15.2 g/dL (ref 13.0–17.7)
MCH: 27.2 pg (ref 26.6–33.0)
MCHC: 31.5 g/dL (ref 31.5–35.7)
MCV: 86 fL (ref 79–97)
Platelets: 234 x10E3/uL (ref 150–450)
RBC: 5.58 x10E6/uL (ref 4.14–5.80)
RDW: 13.6 % (ref 11.6–15.4)
WBC: 3 x10E3/uL — ABNORMAL LOW (ref 3.4–10.8)

## 2024-09-20 ENCOUNTER — Ambulatory Visit: Payer: Self-pay | Admitting: Nurse Practitioner

## 2024-10-01 ENCOUNTER — Ambulatory Visit: Admitting: Audiologist

## 2024-10-26 ENCOUNTER — Encounter: Payer: Self-pay | Admitting: Audiologist

## 2024-10-26 ENCOUNTER — Ambulatory Visit: Admitting: Audiologist

## 2024-10-26 ENCOUNTER — Ambulatory Visit: Attending: Nurse Practitioner | Admitting: Audiologist

## 2024-10-26 DIAGNOSIS — H903 Sensorineural hearing loss, bilateral: Secondary | ICD-10-CM | POA: Insufficient documentation

## 2024-10-26 NOTE — Procedures (Signed)
" °  Outpatient Audiology and [facility] [address] [place], [place]  [address] [phone]  AUDIOLOGICAL  EVALUATION  NAME: Manuel Brooks     DOB:   09/07/1961      MRN: [id number]                                                                                     DATE: 10/26/2024     REFERENT: Oley Bascom RAMAN, NP STATUS: Outpatient DIAGNOSIS: Sloping Sensorineural Hearing Loss Bilateral    History: Manuel Brooks was seen for an audiological evaluation due to difficulty hearing, worse in the left ear. 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 He does not know if they showed hearing loss. 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 He started sneezing in the dust, and his ears popped. The right is better now, and the left is still intermittently congested. He hears a rattling when he rubs his left ear. Manuel Brooks denies pain, pressure, or tinnitus today.  Manuel Brooks has history of hazardous noise exposure.  Medical history shows no additional risk for hearing loss.   Manuel Brooks declined interpreting for appointment. Waiver signed and scanned into media.   Evaluation:  Otoscopy showed a clear view of the tympanic membranes, bilaterally Tympanometry results were consistent with normal middle ear compliance bilaterally   Audiometric testing was completed using Conventional Audiometry techniques with insert earphones and supraural headphones. Test results are consistent with normal sloping to severe sensorineural hearing loss bilaterally. Speech Recognition Thresholds were obtained at  50dB HL in the right ear and at 50dB HL in the left ear. Word Recognition Testing was not completed due to limited english proficiency.    Results:  The test results were reviewed with Manuel Brooks. He may have some congestion in both ears, however today his middle ear function is normal. He has a sensorineural severe high pitched hearing loss in both ears. This is likely due to age and noise  exposure. He needs hearing aids. He can also talk to the doctor about the popping sound and rattling.  Audiogram printed and provided to Manuel Brooks.    Recommendations: Hearing aids recommended for both ears. Oley Bascom RAMAN, NP: Please refer Manuel Brooks to [facility] in [place] for [facility] hearing aid consultation.   30 minutes spent testing and counseling on results.   If you have any questions please feel free to contact me at [phone].  Lauraine Ka Stalnaker Au.D.  Audiologist   10/26/2024  2:22 PM  Cc: Oley Bascom RAMAN, NP  "

## 2025-03-18 ENCOUNTER — Ambulatory Visit: Payer: Self-pay | Admitting: Nurse Practitioner
